# Patient Record
Sex: Female | Born: 1935 | Race: White | Hispanic: No | Smoking: Former smoker
Health system: Southern US, Community
[De-identification: ages and names within clinical notes are randomized; demographics above are authoritative.]

## PROBLEM LIST (undated history)

## (undated) DIAGNOSIS — I5023 Acute on chronic systolic (congestive) heart failure: Secondary | ICD-10-CM

## (undated) DIAGNOSIS — I255 Ischemic cardiomyopathy: Secondary | ICD-10-CM

## (undated) DIAGNOSIS — I2510 Atherosclerotic heart disease of native coronary artery without angina pectoris: Secondary | ICD-10-CM

## (undated) DIAGNOSIS — Z951 Presence of aortocoronary bypass graft: Secondary | ICD-10-CM

## (undated) DIAGNOSIS — F419 Anxiety disorder, unspecified: Secondary | ICD-10-CM

## (undated) DIAGNOSIS — Z95810 Presence of automatic (implantable) cardiac defibrillator: Secondary | ICD-10-CM

## (undated) DIAGNOSIS — J449 Chronic obstructive pulmonary disease, unspecified: Secondary | ICD-10-CM

## (undated) DIAGNOSIS — I10 Essential (primary) hypertension: Secondary | ICD-10-CM

## (undated) DIAGNOSIS — E785 Hyperlipidemia, unspecified: Secondary | ICD-10-CM

## (undated) DIAGNOSIS — J441 Chronic obstructive pulmonary disease with (acute) exacerbation: Secondary | ICD-10-CM

## (undated) DIAGNOSIS — I509 Heart failure, unspecified: Secondary | ICD-10-CM

## (undated) DIAGNOSIS — Z86711 Personal history of pulmonary embolism: Secondary | ICD-10-CM

## (undated) HISTORY — PX: CARDIAC CATHETERIZATION: SHX172

## (undated) HISTORY — DX: Presence of automatic (implantable) cardiac defibrillator: Z95.810

## (undated) HISTORY — DX: Ischemic cardiomyopathy: I25.5

## (undated) HISTORY — PX: CORONARY ARTERY BYPASS GRAFT: SHX141

## (undated) HISTORY — DX: Presence of aortocoronary bypass graft: Z95.1

## (undated) HISTORY — DX: Acute on chronic systolic (congestive) heart failure: I50.23

## (undated) HISTORY — DX: Chronic obstructive pulmonary disease with (acute) exacerbation: J44.1

## (undated) HISTORY — PX: TUBAL LIGATION: SHX77

---

## 1978-03-10 DIAGNOSIS — Z86711 Personal history of pulmonary embolism: Secondary | ICD-10-CM

## 1978-03-10 HISTORY — DX: Personal history of pulmonary embolism: Z86.711

## 2011-08-06 DIAGNOSIS — Z79899 Other long term (current) drug therapy: Secondary | ICD-10-CM | POA: Diagnosis not present

## 2011-08-06 DIAGNOSIS — J438 Other emphysema: Secondary | ICD-10-CM | POA: Diagnosis not present

## 2011-08-06 DIAGNOSIS — B372 Candidiasis of skin and nail: Secondary | ICD-10-CM | POA: Diagnosis not present

## 2011-08-06 DIAGNOSIS — I2510 Atherosclerotic heart disease of native coronary artery without angina pectoris: Secondary | ICD-10-CM | POA: Diagnosis not present

## 2011-08-06 DIAGNOSIS — E785 Hyperlipidemia, unspecified: Secondary | ICD-10-CM | POA: Diagnosis not present

## 2012-02-10 DIAGNOSIS — I2510 Atherosclerotic heart disease of native coronary artery without angina pectoris: Secondary | ICD-10-CM | POA: Diagnosis not present

## 2012-02-10 DIAGNOSIS — I10 Essential (primary) hypertension: Secondary | ICD-10-CM | POA: Diagnosis not present

## 2012-02-10 DIAGNOSIS — E785 Hyperlipidemia, unspecified: Secondary | ICD-10-CM | POA: Diagnosis not present

## 2012-02-10 DIAGNOSIS — J441 Chronic obstructive pulmonary disease with (acute) exacerbation: Secondary | ICD-10-CM | POA: Diagnosis not present

## 2012-02-10 DIAGNOSIS — F411 Generalized anxiety disorder: Secondary | ICD-10-CM | POA: Diagnosis not present

## 2012-08-10 DIAGNOSIS — I10 Essential (primary) hypertension: Secondary | ICD-10-CM | POA: Diagnosis not present

## 2012-08-10 DIAGNOSIS — I2510 Atherosclerotic heart disease of native coronary artery without angina pectoris: Secondary | ICD-10-CM | POA: Diagnosis not present

## 2012-08-10 DIAGNOSIS — Z1331 Encounter for screening for depression: Secondary | ICD-10-CM | POA: Diagnosis not present

## 2012-08-10 DIAGNOSIS — Z9181 History of falling: Secondary | ICD-10-CM | POA: Diagnosis not present

## 2012-11-13 DIAGNOSIS — J438 Other emphysema: Secondary | ICD-10-CM | POA: Diagnosis not present

## 2012-11-13 DIAGNOSIS — J189 Pneumonia, unspecified organism: Secondary | ICD-10-CM | POA: Diagnosis not present

## 2012-11-13 DIAGNOSIS — A779 Spotted fever, unspecified: Secondary | ICD-10-CM | POA: Diagnosis not present

## 2012-11-13 DIAGNOSIS — Z79899 Other long term (current) drug therapy: Secondary | ICD-10-CM | POA: Diagnosis not present

## 2013-03-05 DIAGNOSIS — J441 Chronic obstructive pulmonary disease with (acute) exacerbation: Secondary | ICD-10-CM | POA: Diagnosis not present

## 2013-03-05 DIAGNOSIS — R0789 Other chest pain: Secondary | ICD-10-CM | POA: Diagnosis not present

## 2013-03-05 DIAGNOSIS — R0989 Other specified symptoms and signs involving the circulatory and respiratory systems: Secondary | ICD-10-CM | POA: Diagnosis not present

## 2013-03-05 DIAGNOSIS — R42 Dizziness and giddiness: Secondary | ICD-10-CM | POA: Diagnosis not present

## 2013-03-05 DIAGNOSIS — Z87891 Personal history of nicotine dependence: Secondary | ICD-10-CM | POA: Diagnosis not present

## 2013-03-05 DIAGNOSIS — I252 Old myocardial infarction: Secondary | ICD-10-CM | POA: Diagnosis not present

## 2013-03-05 DIAGNOSIS — R5381 Other malaise: Secondary | ICD-10-CM | POA: Diagnosis not present

## 2013-03-05 DIAGNOSIS — Z79899 Other long term (current) drug therapy: Secondary | ICD-10-CM | POA: Diagnosis not present

## 2013-03-05 DIAGNOSIS — R0602 Shortness of breath: Secondary | ICD-10-CM | POA: Diagnosis not present

## 2013-03-05 DIAGNOSIS — Z7982 Long term (current) use of aspirin: Secondary | ICD-10-CM | POA: Diagnosis not present

## 2013-03-05 DIAGNOSIS — IMO0002 Reserved for concepts with insufficient information to code with codable children: Secondary | ICD-10-CM | POA: Diagnosis not present

## 2013-03-05 DIAGNOSIS — R069 Unspecified abnormalities of breathing: Secondary | ICD-10-CM | POA: Diagnosis not present

## 2013-03-05 DIAGNOSIS — R079 Chest pain, unspecified: Secondary | ICD-10-CM | POA: Diagnosis not present

## 2013-04-18 DIAGNOSIS — R5381 Other malaise: Secondary | ICD-10-CM | POA: Diagnosis not present

## 2013-04-18 DIAGNOSIS — J329 Chronic sinusitis, unspecified: Secondary | ICD-10-CM | POA: Diagnosis not present

## 2013-04-18 DIAGNOSIS — Z79899 Other long term (current) drug therapy: Secondary | ICD-10-CM | POA: Diagnosis not present

## 2013-04-18 DIAGNOSIS — R0602 Shortness of breath: Secondary | ICD-10-CM | POA: Diagnosis not present

## 2013-04-18 DIAGNOSIS — J440 Chronic obstructive pulmonary disease with acute lower respiratory infection: Secondary | ICD-10-CM | POA: Diagnosis not present

## 2013-04-18 DIAGNOSIS — I10 Essential (primary) hypertension: Secondary | ICD-10-CM | POA: Diagnosis not present

## 2013-04-18 DIAGNOSIS — E7800 Pure hypercholesterolemia, unspecified: Secondary | ICD-10-CM | POA: Diagnosis not present

## 2013-04-18 DIAGNOSIS — I509 Heart failure, unspecified: Secondary | ICD-10-CM | POA: Diagnosis not present

## 2013-04-18 DIAGNOSIS — J209 Acute bronchitis, unspecified: Secondary | ICD-10-CM | POA: Diagnosis not present

## 2013-08-10 DIAGNOSIS — I2510 Atherosclerotic heart disease of native coronary artery without angina pectoris: Secondary | ICD-10-CM | POA: Diagnosis not present

## 2013-08-10 DIAGNOSIS — B372 Candidiasis of skin and nail: Secondary | ICD-10-CM | POA: Diagnosis not present

## 2013-08-10 DIAGNOSIS — J438 Other emphysema: Secondary | ICD-10-CM | POA: Diagnosis not present

## 2013-08-10 DIAGNOSIS — J441 Chronic obstructive pulmonary disease with (acute) exacerbation: Secondary | ICD-10-CM | POA: Diagnosis not present

## 2013-08-10 DIAGNOSIS — I10 Essential (primary) hypertension: Secondary | ICD-10-CM | POA: Diagnosis not present

## 2013-11-07 DIAGNOSIS — H353290 Exudative age-related macular degeneration, unspecified eye, stage unspecified: Secondary | ICD-10-CM | POA: Diagnosis not present

## 2013-11-17 DIAGNOSIS — H353290 Exudative age-related macular degeneration, unspecified eye, stage unspecified: Secondary | ICD-10-CM | POA: Diagnosis not present

## 2013-11-17 DIAGNOSIS — H3560 Retinal hemorrhage, unspecified eye: Secondary | ICD-10-CM | POA: Diagnosis not present

## 2013-12-29 DIAGNOSIS — H3562 Retinal hemorrhage, left eye: Secondary | ICD-10-CM | POA: Diagnosis not present

## 2013-12-29 DIAGNOSIS — H3532 Exudative age-related macular degeneration: Secondary | ICD-10-CM | POA: Diagnosis not present

## 2014-02-09 DIAGNOSIS — R609 Edema, unspecified: Secondary | ICD-10-CM | POA: Diagnosis not present

## 2014-02-09 DIAGNOSIS — I10 Essential (primary) hypertension: Secondary | ICD-10-CM | POA: Diagnosis not present

## 2014-02-09 DIAGNOSIS — I2510 Atherosclerotic heart disease of native coronary artery without angina pectoris: Secondary | ICD-10-CM | POA: Diagnosis not present

## 2014-02-09 DIAGNOSIS — R634 Abnormal weight loss: Secondary | ICD-10-CM | POA: Diagnosis not present

## 2014-02-09 DIAGNOSIS — J019 Acute sinusitis, unspecified: Secondary | ICD-10-CM | POA: Diagnosis not present

## 2014-02-09 DIAGNOSIS — J309 Allergic rhinitis, unspecified: Secondary | ICD-10-CM | POA: Diagnosis not present

## 2014-02-09 DIAGNOSIS — F419 Anxiety disorder, unspecified: Secondary | ICD-10-CM | POA: Diagnosis not present

## 2014-02-16 DIAGNOSIS — H3532 Exudative age-related macular degeneration: Secondary | ICD-10-CM | POA: Diagnosis not present

## 2014-03-23 DIAGNOSIS — H3532 Exudative age-related macular degeneration: Secondary | ICD-10-CM | POA: Diagnosis not present

## 2014-05-25 DIAGNOSIS — H3532 Exudative age-related macular degeneration: Secondary | ICD-10-CM | POA: Diagnosis not present

## 2014-05-26 DIAGNOSIS — F419 Anxiety disorder, unspecified: Secondary | ICD-10-CM | POA: Diagnosis present

## 2014-05-26 DIAGNOSIS — I517 Cardiomegaly: Secondary | ICD-10-CM | POA: Diagnosis not present

## 2014-05-26 DIAGNOSIS — I38 Endocarditis, valve unspecified: Secondary | ICD-10-CM | POA: Diagnosis present

## 2014-05-26 DIAGNOSIS — I509 Heart failure, unspecified: Secondary | ICD-10-CM | POA: Diagnosis not present

## 2014-05-26 DIAGNOSIS — Z880 Allergy status to penicillin: Secondary | ICD-10-CM | POA: Diagnosis not present

## 2014-05-26 DIAGNOSIS — Z833 Family history of diabetes mellitus: Secondary | ICD-10-CM | POA: Diagnosis not present

## 2014-05-26 DIAGNOSIS — I429 Cardiomyopathy, unspecified: Secondary | ICD-10-CM | POA: Diagnosis present

## 2014-05-26 DIAGNOSIS — J441 Chronic obstructive pulmonary disease with (acute) exacerbation: Secondary | ICD-10-CM | POA: Diagnosis not present

## 2014-05-26 DIAGNOSIS — Z23 Encounter for immunization: Secondary | ICD-10-CM | POA: Diagnosis not present

## 2014-05-26 DIAGNOSIS — E872 Acidosis: Secondary | ICD-10-CM | POA: Diagnosis not present

## 2014-05-26 DIAGNOSIS — I252 Old myocardial infarction: Secondary | ICD-10-CM | POA: Diagnosis not present

## 2014-05-26 DIAGNOSIS — Z823 Family history of stroke: Secondary | ICD-10-CM | POA: Diagnosis not present

## 2014-05-26 DIAGNOSIS — J9601 Acute respiratory failure with hypoxia: Secondary | ICD-10-CM | POA: Diagnosis present

## 2014-05-26 DIAGNOSIS — Z8249 Family history of ischemic heart disease and other diseases of the circulatory system: Secondary | ICD-10-CM | POA: Diagnosis not present

## 2014-05-26 DIAGNOSIS — I240 Acute coronary thrombosis not resulting in myocardial infarction: Secondary | ICD-10-CM | POA: Diagnosis present

## 2014-05-26 DIAGNOSIS — R300 Dysuria: Secondary | ICD-10-CM | POA: Diagnosis present

## 2014-05-26 DIAGNOSIS — I213 ST elevation (STEMI) myocardial infarction of unspecified site: Secondary | ICD-10-CM | POA: Diagnosis not present

## 2014-05-26 DIAGNOSIS — E780 Pure hypercholesterolemia: Secondary | ICD-10-CM | POA: Diagnosis present

## 2014-05-26 DIAGNOSIS — J9690 Respiratory failure, unspecified, unspecified whether with hypoxia or hypercapnia: Secondary | ICD-10-CM | POA: Diagnosis not present

## 2014-05-26 DIAGNOSIS — I5023 Acute on chronic systolic (congestive) heart failure: Secondary | ICD-10-CM | POA: Diagnosis not present

## 2014-05-26 DIAGNOSIS — Z951 Presence of aortocoronary bypass graft: Secondary | ICD-10-CM | POA: Diagnosis not present

## 2014-05-26 DIAGNOSIS — I10 Essential (primary) hypertension: Secondary | ICD-10-CM | POA: Diagnosis present

## 2014-05-26 DIAGNOSIS — K219 Gastro-esophageal reflux disease without esophagitis: Secondary | ICD-10-CM | POA: Diagnosis present

## 2014-05-26 DIAGNOSIS — I340 Nonrheumatic mitral (valve) insufficiency: Secondary | ICD-10-CM | POA: Diagnosis not present

## 2014-05-26 DIAGNOSIS — K5900 Constipation, unspecified: Secondary | ICD-10-CM | POA: Diagnosis present

## 2014-05-26 DIAGNOSIS — R0602 Shortness of breath: Secondary | ICD-10-CM | POA: Diagnosis not present

## 2014-05-26 DIAGNOSIS — Z7982 Long term (current) use of aspirin: Secondary | ICD-10-CM | POA: Diagnosis not present

## 2014-05-26 DIAGNOSIS — Z79899 Other long term (current) drug therapy: Secondary | ICD-10-CM | POA: Diagnosis not present

## 2014-05-26 DIAGNOSIS — J159 Unspecified bacterial pneumonia: Secondary | ICD-10-CM | POA: Diagnosis not present

## 2014-05-26 DIAGNOSIS — J189 Pneumonia, unspecified organism: Secondary | ICD-10-CM | POA: Diagnosis not present

## 2014-05-26 DIAGNOSIS — J440 Chronic obstructive pulmonary disease with acute lower respiratory infection: Secondary | ICD-10-CM | POA: Diagnosis not present

## 2014-05-26 DIAGNOSIS — J9600 Acute respiratory failure, unspecified whether with hypoxia or hypercapnia: Secondary | ICD-10-CM | POA: Diagnosis not present

## 2014-05-26 DIAGNOSIS — Z836 Family history of other diseases of the respiratory system: Secondary | ICD-10-CM | POA: Diagnosis not present

## 2014-05-26 DIAGNOSIS — R918 Other nonspecific abnormal finding of lung field: Secondary | ICD-10-CM | POA: Diagnosis not present

## 2014-05-26 DIAGNOSIS — J984 Other disorders of lung: Secondary | ICD-10-CM | POA: Diagnosis not present

## 2014-05-26 DIAGNOSIS — M1990 Unspecified osteoarthritis, unspecified site: Secondary | ICD-10-CM | POA: Diagnosis present

## 2014-05-26 DIAGNOSIS — I428 Other cardiomyopathies: Secondary | ICD-10-CM | POA: Diagnosis not present

## 2014-05-29 DIAGNOSIS — I213 ST elevation (STEMI) myocardial infarction of unspecified site: Secondary | ICD-10-CM | POA: Diagnosis not present

## 2014-05-29 DIAGNOSIS — J159 Unspecified bacterial pneumonia: Secondary | ICD-10-CM | POA: Diagnosis not present

## 2014-05-29 DIAGNOSIS — J441 Chronic obstructive pulmonary disease with (acute) exacerbation: Secondary | ICD-10-CM | POA: Diagnosis not present

## 2014-05-29 DIAGNOSIS — I2510 Atherosclerotic heart disease of native coronary artery without angina pectoris: Secondary | ICD-10-CM | POA: Diagnosis not present

## 2014-05-29 DIAGNOSIS — I509 Heart failure, unspecified: Secondary | ICD-10-CM | POA: Diagnosis not present

## 2014-05-31 DIAGNOSIS — I2510 Atherosclerotic heart disease of native coronary artery without angina pectoris: Secondary | ICD-10-CM | POA: Diagnosis not present

## 2014-05-31 DIAGNOSIS — I213 ST elevation (STEMI) myocardial infarction of unspecified site: Secondary | ICD-10-CM | POA: Diagnosis not present

## 2014-05-31 DIAGNOSIS — J441 Chronic obstructive pulmonary disease with (acute) exacerbation: Secondary | ICD-10-CM | POA: Diagnosis not present

## 2014-05-31 DIAGNOSIS — J159 Unspecified bacterial pneumonia: Secondary | ICD-10-CM | POA: Diagnosis not present

## 2014-05-31 DIAGNOSIS — I509 Heart failure, unspecified: Secondary | ICD-10-CM | POA: Diagnosis not present

## 2014-06-01 DIAGNOSIS — I509 Heart failure, unspecified: Secondary | ICD-10-CM | POA: Diagnosis not present

## 2014-06-01 DIAGNOSIS — J441 Chronic obstructive pulmonary disease with (acute) exacerbation: Secondary | ICD-10-CM | POA: Diagnosis not present

## 2014-06-01 DIAGNOSIS — I213 ST elevation (STEMI) myocardial infarction of unspecified site: Secondary | ICD-10-CM | POA: Diagnosis not present

## 2014-06-01 DIAGNOSIS — J189 Pneumonia, unspecified organism: Secondary | ICD-10-CM | POA: Diagnosis not present

## 2014-06-01 DIAGNOSIS — Z79899 Other long term (current) drug therapy: Secondary | ICD-10-CM | POA: Diagnosis not present

## 2014-06-01 DIAGNOSIS — I2510 Atherosclerotic heart disease of native coronary artery without angina pectoris: Secondary | ICD-10-CM | POA: Diagnosis not present

## 2014-06-05 DIAGNOSIS — I213 ST elevation (STEMI) myocardial infarction of unspecified site: Secondary | ICD-10-CM | POA: Diagnosis not present

## 2014-06-05 DIAGNOSIS — J441 Chronic obstructive pulmonary disease with (acute) exacerbation: Secondary | ICD-10-CM | POA: Diagnosis not present

## 2014-06-05 DIAGNOSIS — J159 Unspecified bacterial pneumonia: Secondary | ICD-10-CM | POA: Diagnosis not present

## 2014-06-05 DIAGNOSIS — I2510 Atherosclerotic heart disease of native coronary artery without angina pectoris: Secondary | ICD-10-CM | POA: Diagnosis not present

## 2014-06-05 DIAGNOSIS — I509 Heart failure, unspecified: Secondary | ICD-10-CM | POA: Diagnosis not present

## 2014-06-07 DIAGNOSIS — I509 Heart failure, unspecified: Secondary | ICD-10-CM | POA: Diagnosis not present

## 2014-06-07 DIAGNOSIS — I2510 Atherosclerotic heart disease of native coronary artery without angina pectoris: Secondary | ICD-10-CM | POA: Diagnosis not present

## 2014-06-07 DIAGNOSIS — I213 ST elevation (STEMI) myocardial infarction of unspecified site: Secondary | ICD-10-CM | POA: Diagnosis not present

## 2014-06-07 DIAGNOSIS — J441 Chronic obstructive pulmonary disease with (acute) exacerbation: Secondary | ICD-10-CM | POA: Diagnosis not present

## 2014-06-07 DIAGNOSIS — J159 Unspecified bacterial pneumonia: Secondary | ICD-10-CM | POA: Diagnosis not present

## 2014-06-08 DIAGNOSIS — I428 Other cardiomyopathies: Secondary | ICD-10-CM | POA: Diagnosis not present

## 2014-06-08 DIAGNOSIS — I2510 Atherosclerotic heart disease of native coronary artery without angina pectoris: Secondary | ICD-10-CM | POA: Diagnosis not present

## 2014-06-08 DIAGNOSIS — I10 Essential (primary) hypertension: Secondary | ICD-10-CM | POA: Diagnosis not present

## 2014-06-09 DIAGNOSIS — J159 Unspecified bacterial pneumonia: Secondary | ICD-10-CM | POA: Diagnosis not present

## 2014-06-09 DIAGNOSIS — J441 Chronic obstructive pulmonary disease with (acute) exacerbation: Secondary | ICD-10-CM | POA: Diagnosis not present

## 2014-06-09 DIAGNOSIS — I2510 Atherosclerotic heart disease of native coronary artery without angina pectoris: Secondary | ICD-10-CM | POA: Diagnosis not present

## 2014-06-09 DIAGNOSIS — I509 Heart failure, unspecified: Secondary | ICD-10-CM | POA: Diagnosis not present

## 2014-06-09 DIAGNOSIS — I213 ST elevation (STEMI) myocardial infarction of unspecified site: Secondary | ICD-10-CM | POA: Diagnosis not present

## 2014-06-13 DIAGNOSIS — M1990 Unspecified osteoarthritis, unspecified site: Secondary | ICD-10-CM | POA: Diagnosis present

## 2014-06-13 DIAGNOSIS — I441 Atrioventricular block, second degree: Secondary | ICD-10-CM | POA: Diagnosis not present

## 2014-06-13 DIAGNOSIS — Z87448 Personal history of other diseases of urinary system: Secondary | ICD-10-CM | POA: Diagnosis not present

## 2014-06-13 DIAGNOSIS — I509 Heart failure, unspecified: Secondary | ICD-10-CM | POA: Diagnosis not present

## 2014-06-13 DIAGNOSIS — N179 Acute kidney failure, unspecified: Secondary | ICD-10-CM | POA: Diagnosis not present

## 2014-06-13 DIAGNOSIS — I10 Essential (primary) hypertension: Secondary | ICD-10-CM | POA: Diagnosis present

## 2014-06-13 DIAGNOSIS — T460X1A Poisoning by cardiac-stimulant glycosides and drugs of similar action, accidental (unintentional), initial encounter: Secondary | ICD-10-CM | POA: Diagnosis not present

## 2014-06-13 DIAGNOSIS — Z951 Presence of aortocoronary bypass graft: Secondary | ICD-10-CM | POA: Diagnosis not present

## 2014-06-13 DIAGNOSIS — I429 Cardiomyopathy, unspecified: Secondary | ICD-10-CM | POA: Diagnosis present

## 2014-06-13 DIAGNOSIS — I5022 Chronic systolic (congestive) heart failure: Secondary | ICD-10-CM | POA: Diagnosis present

## 2014-06-13 DIAGNOSIS — R4182 Altered mental status, unspecified: Secondary | ICD-10-CM | POA: Diagnosis not present

## 2014-06-13 DIAGNOSIS — I38 Endocarditis, valve unspecified: Secondary | ICD-10-CM | POA: Diagnosis present

## 2014-06-13 DIAGNOSIS — R001 Bradycardia, unspecified: Secondary | ICD-10-CM | POA: Diagnosis not present

## 2014-06-13 DIAGNOSIS — J439 Emphysema, unspecified: Secondary | ICD-10-CM | POA: Diagnosis present

## 2014-06-13 DIAGNOSIS — J80 Acute respiratory distress syndrome: Secondary | ICD-10-CM | POA: Diagnosis not present

## 2014-06-13 DIAGNOSIS — G92 Toxic encephalopathy: Secondary | ICD-10-CM | POA: Diagnosis not present

## 2014-06-13 DIAGNOSIS — R4781 Slurred speech: Secondary | ICD-10-CM | POA: Diagnosis not present

## 2014-06-13 DIAGNOSIS — Z8701 Personal history of pneumonia (recurrent): Secondary | ICD-10-CM | POA: Diagnosis not present

## 2014-06-13 DIAGNOSIS — Z9889 Other specified postprocedural states: Secondary | ICD-10-CM | POA: Diagnosis not present

## 2014-06-13 DIAGNOSIS — I213 ST elevation (STEMI) myocardial infarction of unspecified site: Secondary | ICD-10-CM | POA: Diagnosis present

## 2014-06-13 DIAGNOSIS — K219 Gastro-esophageal reflux disease without esophagitis: Secondary | ICD-10-CM | POA: Diagnosis present

## 2014-06-13 DIAGNOSIS — E780 Pure hypercholesterolemia: Secondary | ICD-10-CM | POA: Diagnosis present

## 2014-06-13 DIAGNOSIS — R413 Other amnesia: Secondary | ICD-10-CM | POA: Diagnosis not present

## 2014-06-13 DIAGNOSIS — J9611 Chronic respiratory failure with hypoxia: Secondary | ICD-10-CM | POA: Diagnosis not present

## 2014-06-13 DIAGNOSIS — Z86711 Personal history of pulmonary embolism: Secondary | ICD-10-CM | POA: Diagnosis not present

## 2014-06-13 DIAGNOSIS — I2510 Atherosclerotic heart disease of native coronary artery without angina pectoris: Secondary | ICD-10-CM | POA: Diagnosis not present

## 2014-06-13 DIAGNOSIS — R410 Disorientation, unspecified: Secondary | ICD-10-CM | POA: Diagnosis not present

## 2014-06-13 DIAGNOSIS — E875 Hyperkalemia: Secondary | ICD-10-CM | POA: Diagnosis not present

## 2014-06-13 DIAGNOSIS — D649 Anemia, unspecified: Secondary | ICD-10-CM | POA: Diagnosis present

## 2014-06-13 DIAGNOSIS — N170 Acute kidney failure with tubular necrosis: Secondary | ICD-10-CM | POA: Diagnosis not present

## 2014-06-13 DIAGNOSIS — Z7901 Long term (current) use of anticoagulants: Secondary | ICD-10-CM | POA: Diagnosis not present

## 2014-06-13 DIAGNOSIS — I442 Atrioventricular block, complete: Secondary | ICD-10-CM | POA: Diagnosis present

## 2014-06-13 DIAGNOSIS — F419 Anxiety disorder, unspecified: Secondary | ICD-10-CM | POA: Diagnosis present

## 2014-06-13 DIAGNOSIS — J449 Chronic obstructive pulmonary disease, unspecified: Secondary | ICD-10-CM | POA: Diagnosis not present

## 2014-06-13 DIAGNOSIS — I252 Old myocardial infarction: Secondary | ICD-10-CM | POA: Diagnosis not present

## 2014-06-13 DIAGNOSIS — I6789 Other cerebrovascular disease: Secondary | ICD-10-CM | POA: Diagnosis not present

## 2014-06-22 DIAGNOSIS — N179 Acute kidney failure, unspecified: Secondary | ICD-10-CM | POA: Diagnosis not present

## 2014-06-22 DIAGNOSIS — Z1389 Encounter for screening for other disorder: Secondary | ICD-10-CM | POA: Diagnosis not present

## 2014-06-22 DIAGNOSIS — I459 Conduction disorder, unspecified: Secondary | ICD-10-CM | POA: Diagnosis not present

## 2014-06-22 DIAGNOSIS — J441 Chronic obstructive pulmonary disease with (acute) exacerbation: Secondary | ICD-10-CM | POA: Diagnosis not present

## 2014-06-22 DIAGNOSIS — I509 Heart failure, unspecified: Secondary | ICD-10-CM | POA: Diagnosis not present

## 2014-06-22 DIAGNOSIS — I213 ST elevation (STEMI) myocardial infarction of unspecified site: Secondary | ICD-10-CM | POA: Diagnosis not present

## 2014-06-22 DIAGNOSIS — E875 Hyperkalemia: Secondary | ICD-10-CM | POA: Diagnosis not present

## 2014-06-22 DIAGNOSIS — Z9181 History of falling: Secondary | ICD-10-CM | POA: Diagnosis not present

## 2014-06-22 DIAGNOSIS — T460X1A Poisoning by cardiac-stimulant glycosides and drugs of similar action, accidental (unintentional), initial encounter: Secondary | ICD-10-CM | POA: Diagnosis not present

## 2014-06-23 DIAGNOSIS — J441 Chronic obstructive pulmonary disease with (acute) exacerbation: Secondary | ICD-10-CM | POA: Diagnosis not present

## 2014-06-23 DIAGNOSIS — I509 Heart failure, unspecified: Secondary | ICD-10-CM | POA: Diagnosis not present

## 2014-06-23 DIAGNOSIS — I2510 Atherosclerotic heart disease of native coronary artery without angina pectoris: Secondary | ICD-10-CM | POA: Diagnosis not present

## 2014-06-23 DIAGNOSIS — I213 ST elevation (STEMI) myocardial infarction of unspecified site: Secondary | ICD-10-CM | POA: Diagnosis not present

## 2014-06-23 DIAGNOSIS — J159 Unspecified bacterial pneumonia: Secondary | ICD-10-CM | POA: Diagnosis not present

## 2014-06-27 DIAGNOSIS — I2510 Atherosclerotic heart disease of native coronary artery without angina pectoris: Secondary | ICD-10-CM | POA: Diagnosis not present

## 2014-06-27 DIAGNOSIS — I213 ST elevation (STEMI) myocardial infarction of unspecified site: Secondary | ICD-10-CM | POA: Diagnosis not present

## 2014-06-27 DIAGNOSIS — J441 Chronic obstructive pulmonary disease with (acute) exacerbation: Secondary | ICD-10-CM | POA: Diagnosis not present

## 2014-06-27 DIAGNOSIS — J159 Unspecified bacterial pneumonia: Secondary | ICD-10-CM | POA: Diagnosis not present

## 2014-06-27 DIAGNOSIS — I509 Heart failure, unspecified: Secondary | ICD-10-CM | POA: Diagnosis not present

## 2014-06-30 DIAGNOSIS — I2510 Atherosclerotic heart disease of native coronary artery without angina pectoris: Secondary | ICD-10-CM | POA: Diagnosis not present

## 2014-06-30 DIAGNOSIS — J159 Unspecified bacterial pneumonia: Secondary | ICD-10-CM | POA: Diagnosis not present

## 2014-06-30 DIAGNOSIS — J441 Chronic obstructive pulmonary disease with (acute) exacerbation: Secondary | ICD-10-CM | POA: Diagnosis not present

## 2014-06-30 DIAGNOSIS — I213 ST elevation (STEMI) myocardial infarction of unspecified site: Secondary | ICD-10-CM | POA: Diagnosis not present

## 2014-06-30 DIAGNOSIS — I509 Heart failure, unspecified: Secondary | ICD-10-CM | POA: Diagnosis not present

## 2014-07-05 DIAGNOSIS — J441 Chronic obstructive pulmonary disease with (acute) exacerbation: Secondary | ICD-10-CM | POA: Diagnosis not present

## 2014-07-05 DIAGNOSIS — I2510 Atherosclerotic heart disease of native coronary artery without angina pectoris: Secondary | ICD-10-CM | POA: Diagnosis not present

## 2014-07-05 DIAGNOSIS — I213 ST elevation (STEMI) myocardial infarction of unspecified site: Secondary | ICD-10-CM | POA: Diagnosis not present

## 2014-07-05 DIAGNOSIS — J159 Unspecified bacterial pneumonia: Secondary | ICD-10-CM | POA: Diagnosis not present

## 2014-07-05 DIAGNOSIS — I509 Heart failure, unspecified: Secondary | ICD-10-CM | POA: Diagnosis not present

## 2014-07-07 DIAGNOSIS — J441 Chronic obstructive pulmonary disease with (acute) exacerbation: Secondary | ICD-10-CM | POA: Diagnosis not present

## 2014-07-07 DIAGNOSIS — I213 ST elevation (STEMI) myocardial infarction of unspecified site: Secondary | ICD-10-CM | POA: Diagnosis not present

## 2014-07-07 DIAGNOSIS — I2510 Atherosclerotic heart disease of native coronary artery without angina pectoris: Secondary | ICD-10-CM | POA: Diagnosis not present

## 2014-07-07 DIAGNOSIS — I509 Heart failure, unspecified: Secondary | ICD-10-CM | POA: Diagnosis not present

## 2014-07-07 DIAGNOSIS — J159 Unspecified bacterial pneumonia: Secondary | ICD-10-CM | POA: Diagnosis not present

## 2014-07-10 DIAGNOSIS — J159 Unspecified bacterial pneumonia: Secondary | ICD-10-CM | POA: Diagnosis not present

## 2014-07-10 DIAGNOSIS — I213 ST elevation (STEMI) myocardial infarction of unspecified site: Secondary | ICD-10-CM | POA: Diagnosis not present

## 2014-07-10 DIAGNOSIS — I509 Heart failure, unspecified: Secondary | ICD-10-CM | POA: Diagnosis not present

## 2014-07-10 DIAGNOSIS — J441 Chronic obstructive pulmonary disease with (acute) exacerbation: Secondary | ICD-10-CM | POA: Diagnosis not present

## 2014-07-10 DIAGNOSIS — I2510 Atherosclerotic heart disease of native coronary artery without angina pectoris: Secondary | ICD-10-CM | POA: Diagnosis not present

## 2014-07-13 DIAGNOSIS — I2510 Atherosclerotic heart disease of native coronary artery without angina pectoris: Secondary | ICD-10-CM | POA: Diagnosis not present

## 2014-07-13 DIAGNOSIS — I509 Heart failure, unspecified: Secondary | ICD-10-CM | POA: Diagnosis not present

## 2014-07-13 DIAGNOSIS — J441 Chronic obstructive pulmonary disease with (acute) exacerbation: Secondary | ICD-10-CM | POA: Diagnosis not present

## 2014-07-13 DIAGNOSIS — I213 ST elevation (STEMI) myocardial infarction of unspecified site: Secondary | ICD-10-CM | POA: Diagnosis not present

## 2014-07-13 DIAGNOSIS — J159 Unspecified bacterial pneumonia: Secondary | ICD-10-CM | POA: Diagnosis not present

## 2014-07-13 DIAGNOSIS — H3532 Exudative age-related macular degeneration: Secondary | ICD-10-CM | POA: Diagnosis not present

## 2014-07-20 DIAGNOSIS — I2510 Atherosclerotic heart disease of native coronary artery without angina pectoris: Secondary | ICD-10-CM | POA: Diagnosis not present

## 2014-07-20 DIAGNOSIS — J159 Unspecified bacterial pneumonia: Secondary | ICD-10-CM | POA: Diagnosis not present

## 2014-07-20 DIAGNOSIS — I509 Heart failure, unspecified: Secondary | ICD-10-CM | POA: Diagnosis not present

## 2014-07-20 DIAGNOSIS — J441 Chronic obstructive pulmonary disease with (acute) exacerbation: Secondary | ICD-10-CM | POA: Diagnosis not present

## 2014-07-20 DIAGNOSIS — I213 ST elevation (STEMI) myocardial infarction of unspecified site: Secondary | ICD-10-CM | POA: Diagnosis not present

## 2014-07-24 DIAGNOSIS — I428 Other cardiomyopathies: Secondary | ICD-10-CM | POA: Diagnosis not present

## 2014-07-24 DIAGNOSIS — I2510 Atherosclerotic heart disease of native coronary artery without angina pectoris: Secondary | ICD-10-CM | POA: Diagnosis not present

## 2014-07-24 DIAGNOSIS — I509 Heart failure, unspecified: Secondary | ICD-10-CM | POA: Diagnosis not present

## 2014-08-11 DIAGNOSIS — Z6823 Body mass index (BMI) 23.0-23.9, adult: Secondary | ICD-10-CM | POA: Diagnosis not present

## 2014-08-11 DIAGNOSIS — I2510 Atherosclerotic heart disease of native coronary artery without angina pectoris: Secondary | ICD-10-CM | POA: Diagnosis not present

## 2014-08-11 DIAGNOSIS — I429 Cardiomyopathy, unspecified: Secondary | ICD-10-CM | POA: Diagnosis not present

## 2014-08-11 DIAGNOSIS — J439 Emphysema, unspecified: Secondary | ICD-10-CM | POA: Diagnosis not present

## 2014-08-11 DIAGNOSIS — I509 Heart failure, unspecified: Secondary | ICD-10-CM | POA: Diagnosis not present

## 2014-08-11 DIAGNOSIS — Z79899 Other long term (current) drug therapy: Secondary | ICD-10-CM | POA: Diagnosis not present

## 2014-08-11 DIAGNOSIS — Z1389 Encounter for screening for other disorder: Secondary | ICD-10-CM | POA: Diagnosis not present

## 2014-08-11 DIAGNOSIS — I10 Essential (primary) hypertension: Secondary | ICD-10-CM | POA: Diagnosis not present

## 2014-08-11 DIAGNOSIS — F419 Anxiety disorder, unspecified: Secondary | ICD-10-CM | POA: Diagnosis not present

## 2014-09-14 DIAGNOSIS — H3532 Exudative age-related macular degeneration: Secondary | ICD-10-CM | POA: Diagnosis not present

## 2014-10-04 DIAGNOSIS — H25813 Combined forms of age-related cataract, bilateral: Secondary | ICD-10-CM | POA: Diagnosis not present

## 2014-12-08 DIAGNOSIS — H25811 Combined forms of age-related cataract, right eye: Secondary | ICD-10-CM | POA: Diagnosis not present

## 2014-12-08 DIAGNOSIS — H3531 Nonexudative age-related macular degeneration: Secondary | ICD-10-CM | POA: Diagnosis not present

## 2015-01-02 DIAGNOSIS — I110 Hypertensive heart disease with heart failure: Secondary | ICD-10-CM | POA: Diagnosis not present

## 2015-01-02 DIAGNOSIS — I2510 Atherosclerotic heart disease of native coronary artery without angina pectoris: Secondary | ICD-10-CM | POA: Diagnosis not present

## 2015-01-02 DIAGNOSIS — E785 Hyperlipidemia, unspecified: Secondary | ICD-10-CM | POA: Diagnosis not present

## 2015-01-02 DIAGNOSIS — K219 Gastro-esophageal reflux disease without esophagitis: Secondary | ICD-10-CM | POA: Diagnosis not present

## 2015-01-02 DIAGNOSIS — Z87891 Personal history of nicotine dependence: Secondary | ICD-10-CM | POA: Diagnosis not present

## 2015-01-02 DIAGNOSIS — J439 Emphysema, unspecified: Secondary | ICD-10-CM | POA: Diagnosis not present

## 2015-01-02 DIAGNOSIS — Z951 Presence of aortocoronary bypass graft: Secondary | ICD-10-CM | POA: Diagnosis not present

## 2015-01-02 DIAGNOSIS — E119 Type 2 diabetes mellitus without complications: Secondary | ICD-10-CM | POA: Diagnosis not present

## 2015-01-02 DIAGNOSIS — H25811 Combined forms of age-related cataract, right eye: Secondary | ICD-10-CM | POA: Diagnosis not present

## 2015-01-02 DIAGNOSIS — I252 Old myocardial infarction: Secondary | ICD-10-CM | POA: Diagnosis not present

## 2015-01-02 DIAGNOSIS — I509 Heart failure, unspecified: Secondary | ICD-10-CM | POA: Diagnosis not present

## 2015-02-09 DIAGNOSIS — I429 Cardiomyopathy, unspecified: Secondary | ICD-10-CM | POA: Diagnosis not present

## 2015-02-09 DIAGNOSIS — J019 Acute sinusitis, unspecified: Secondary | ICD-10-CM | POA: Diagnosis not present

## 2015-02-09 DIAGNOSIS — Z1389 Encounter for screening for other disorder: Secondary | ICD-10-CM | POA: Diagnosis not present

## 2015-02-09 DIAGNOSIS — E785 Hyperlipidemia, unspecified: Secondary | ICD-10-CM | POA: Diagnosis not present

## 2015-02-09 DIAGNOSIS — F419 Anxiety disorder, unspecified: Secondary | ICD-10-CM | POA: Diagnosis not present

## 2015-02-09 DIAGNOSIS — I2510 Atherosclerotic heart disease of native coronary artery without angina pectoris: Secondary | ICD-10-CM | POA: Diagnosis not present

## 2015-02-09 DIAGNOSIS — Z79899 Other long term (current) drug therapy: Secondary | ICD-10-CM | POA: Diagnosis not present

## 2015-02-09 DIAGNOSIS — J439 Emphysema, unspecified: Secondary | ICD-10-CM | POA: Diagnosis not present

## 2015-02-09 DIAGNOSIS — I509 Heart failure, unspecified: Secondary | ICD-10-CM | POA: Diagnosis not present

## 2015-03-20 DIAGNOSIS — I509 Heart failure, unspecified: Secondary | ICD-10-CM | POA: Diagnosis not present

## 2015-03-20 DIAGNOSIS — I2510 Atherosclerotic heart disease of native coronary artery without angina pectoris: Secondary | ICD-10-CM | POA: Diagnosis not present

## 2015-03-20 DIAGNOSIS — K219 Gastro-esophageal reflux disease without esophagitis: Secondary | ICD-10-CM | POA: Diagnosis not present

## 2015-03-20 DIAGNOSIS — H25812 Combined forms of age-related cataract, left eye: Secondary | ICD-10-CM | POA: Diagnosis not present

## 2015-03-20 DIAGNOSIS — I252 Old myocardial infarction: Secondary | ICD-10-CM | POA: Diagnosis not present

## 2015-03-20 DIAGNOSIS — Z951 Presence of aortocoronary bypass graft: Secondary | ICD-10-CM | POA: Diagnosis not present

## 2015-03-20 DIAGNOSIS — Z79899 Other long term (current) drug therapy: Secondary | ICD-10-CM | POA: Diagnosis not present

## 2015-03-20 DIAGNOSIS — E785 Hyperlipidemia, unspecified: Secondary | ICD-10-CM | POA: Diagnosis not present

## 2015-03-20 DIAGNOSIS — J439 Emphysema, unspecified: Secondary | ICD-10-CM | POA: Diagnosis not present

## 2015-03-20 DIAGNOSIS — N189 Chronic kidney disease, unspecified: Secondary | ICD-10-CM | POA: Diagnosis not present

## 2015-03-20 DIAGNOSIS — E119 Type 2 diabetes mellitus without complications: Secondary | ICD-10-CM | POA: Diagnosis not present

## 2015-03-20 DIAGNOSIS — I129 Hypertensive chronic kidney disease with stage 1 through stage 4 chronic kidney disease, or unspecified chronic kidney disease: Secondary | ICD-10-CM | POA: Diagnosis not present

## 2015-03-20 DIAGNOSIS — Z87891 Personal history of nicotine dependence: Secondary | ICD-10-CM | POA: Diagnosis not present

## 2015-04-22 DIAGNOSIS — M546 Pain in thoracic spine: Secondary | ICD-10-CM | POA: Diagnosis not present

## 2015-04-22 DIAGNOSIS — T380X5A Adverse effect of glucocorticoids and synthetic analogues, initial encounter: Secondary | ICD-10-CM | POA: Diagnosis not present

## 2015-04-22 DIAGNOSIS — Z87891 Personal history of nicotine dependence: Secondary | ICD-10-CM | POA: Diagnosis not present

## 2015-04-22 DIAGNOSIS — Z951 Presence of aortocoronary bypass graft: Secondary | ICD-10-CM | POA: Diagnosis not present

## 2015-04-22 DIAGNOSIS — A419 Sepsis, unspecified organism: Secondary | ICD-10-CM | POA: Diagnosis not present

## 2015-04-22 DIAGNOSIS — I509 Heart failure, unspecified: Secondary | ICD-10-CM | POA: Diagnosis not present

## 2015-04-22 DIAGNOSIS — I255 Ischemic cardiomyopathy: Secondary | ICD-10-CM | POA: Diagnosis present

## 2015-04-22 DIAGNOSIS — E876 Hypokalemia: Secondary | ICD-10-CM | POA: Diagnosis not present

## 2015-04-22 DIAGNOSIS — I10 Essential (primary) hypertension: Secondary | ICD-10-CM | POA: Diagnosis not present

## 2015-04-22 DIAGNOSIS — R918 Other nonspecific abnormal finding of lung field: Secondary | ICD-10-CM | POA: Diagnosis not present

## 2015-04-22 DIAGNOSIS — R739 Hyperglycemia, unspecified: Secondary | ICD-10-CM | POA: Diagnosis not present

## 2015-04-22 DIAGNOSIS — R0789 Other chest pain: Secondary | ICD-10-CM | POA: Diagnosis not present

## 2015-04-22 DIAGNOSIS — J441 Chronic obstructive pulmonary disease with (acute) exacerbation: Secondary | ICD-10-CM

## 2015-04-22 DIAGNOSIS — I501 Left ventricular failure: Secondary | ICD-10-CM | POA: Diagnosis not present

## 2015-04-22 DIAGNOSIS — J440 Chronic obstructive pulmonary disease with acute lower respiratory infection: Secondary | ICD-10-CM | POA: Diagnosis not present

## 2015-04-22 DIAGNOSIS — R269 Unspecified abnormalities of gait and mobility: Secondary | ICD-10-CM | POA: Diagnosis present

## 2015-04-22 DIAGNOSIS — Z7982 Long term (current) use of aspirin: Secondary | ICD-10-CM | POA: Diagnosis not present

## 2015-04-22 DIAGNOSIS — Z79899 Other long term (current) drug therapy: Secondary | ICD-10-CM | POA: Diagnosis not present

## 2015-04-22 DIAGNOSIS — Z955 Presence of coronary angioplasty implant and graft: Secondary | ICD-10-CM | POA: Diagnosis not present

## 2015-04-22 DIAGNOSIS — I472 Ventricular tachycardia: Secondary | ICD-10-CM | POA: Diagnosis not present

## 2015-04-22 DIAGNOSIS — E785 Hyperlipidemia, unspecified: Secondary | ICD-10-CM | POA: Diagnosis not present

## 2015-04-22 DIAGNOSIS — Z006 Encounter for examination for normal comparison and control in clinical research program: Secondary | ICD-10-CM | POA: Diagnosis not present

## 2015-04-22 DIAGNOSIS — I2510 Atherosclerotic heart disease of native coronary artery without angina pectoris: Secondary | ICD-10-CM | POA: Diagnosis not present

## 2015-04-22 DIAGNOSIS — R079 Chest pain, unspecified: Secondary | ICD-10-CM | POA: Diagnosis not present

## 2015-04-22 DIAGNOSIS — I5023 Acute on chronic systolic (congestive) heart failure: Secondary | ICD-10-CM | POA: Diagnosis present

## 2015-04-22 DIAGNOSIS — I110 Hypertensive heart disease with heart failure: Secondary | ICD-10-CM | POA: Diagnosis not present

## 2015-04-22 DIAGNOSIS — J449 Chronic obstructive pulmonary disease, unspecified: Secondary | ICD-10-CM | POA: Diagnosis not present

## 2015-04-22 DIAGNOSIS — I081 Rheumatic disorders of both mitral and tricuspid valves: Secondary | ICD-10-CM | POA: Diagnosis not present

## 2015-04-22 DIAGNOSIS — I252 Old myocardial infarction: Secondary | ICD-10-CM | POA: Diagnosis not present

## 2015-04-22 DIAGNOSIS — Z86711 Personal history of pulmonary embolism: Secondary | ICD-10-CM | POA: Diagnosis not present

## 2015-04-22 DIAGNOSIS — Z880 Allergy status to penicillin: Secondary | ICD-10-CM | POA: Diagnosis not present

## 2015-04-22 DIAGNOSIS — J189 Pneumonia, unspecified organism: Secondary | ICD-10-CM | POA: Diagnosis not present

## 2015-04-22 HISTORY — DX: Chronic obstructive pulmonary disease with (acute) exacerbation: J44.1

## 2015-04-24 DIAGNOSIS — I5023 Acute on chronic systolic (congestive) heart failure: Secondary | ICD-10-CM

## 2015-04-24 HISTORY — DX: Acute on chronic systolic (congestive) heart failure: I50.23

## 2015-04-25 DIAGNOSIS — Z95810 Presence of automatic (implantable) cardiac defibrillator: Secondary | ICD-10-CM | POA: Insufficient documentation

## 2015-04-25 HISTORY — DX: Presence of automatic (implantable) cardiac defibrillator: Z95.810

## 2015-05-01 DIAGNOSIS — Z1389 Encounter for screening for other disorder: Secondary | ICD-10-CM | POA: Diagnosis not present

## 2015-05-01 DIAGNOSIS — J181 Lobar pneumonia, unspecified organism: Secondary | ICD-10-CM | POA: Diagnosis not present

## 2015-05-01 DIAGNOSIS — I509 Heart failure, unspecified: Secondary | ICD-10-CM | POA: Diagnosis not present

## 2015-05-01 DIAGNOSIS — Z95810 Presence of automatic (implantable) cardiac defibrillator: Secondary | ICD-10-CM | POA: Diagnosis not present

## 2015-05-01 DIAGNOSIS — F419 Anxiety disorder, unspecified: Secondary | ICD-10-CM | POA: Diagnosis not present

## 2015-05-07 DIAGNOSIS — Z4502 Encounter for adjustment and management of automatic implantable cardiac defibrillator: Secondary | ICD-10-CM | POA: Diagnosis not present

## 2015-05-07 DIAGNOSIS — I5023 Acute on chronic systolic (congestive) heart failure: Secondary | ICD-10-CM | POA: Diagnosis not present

## 2015-05-10 DIAGNOSIS — R000 Tachycardia, unspecified: Secondary | ICD-10-CM | POA: Diagnosis not present

## 2015-05-10 DIAGNOSIS — J810 Acute pulmonary edema: Secondary | ICD-10-CM | POA: Diagnosis not present

## 2015-05-10 DIAGNOSIS — Z4682 Encounter for fitting and adjustment of non-vascular catheter: Secondary | ICD-10-CM | POA: Diagnosis not present

## 2015-05-10 DIAGNOSIS — I959 Hypotension, unspecified: Secondary | ICD-10-CM | POA: Diagnosis not present

## 2015-05-10 DIAGNOSIS — M1990 Unspecified osteoarthritis, unspecified site: Secondary | ICD-10-CM | POA: Diagnosis present

## 2015-05-10 DIAGNOSIS — E1165 Type 2 diabetes mellitus with hyperglycemia: Secondary | ICD-10-CM | POA: Diagnosis not present

## 2015-05-10 DIAGNOSIS — E7800 Pure hypercholesterolemia, unspecified: Secondary | ICD-10-CM | POA: Diagnosis present

## 2015-05-10 DIAGNOSIS — J156 Pneumonia due to other aerobic Gram-negative bacteria: Secondary | ICD-10-CM | POA: Diagnosis not present

## 2015-05-10 DIAGNOSIS — Z86711 Personal history of pulmonary embolism: Secondary | ICD-10-CM | POA: Diagnosis not present

## 2015-05-10 DIAGNOSIS — J9610 Chronic respiratory failure, unspecified whether with hypoxia or hypercapnia: Secondary | ICD-10-CM | POA: Diagnosis not present

## 2015-05-10 DIAGNOSIS — R0602 Shortness of breath: Secondary | ICD-10-CM | POA: Diagnosis not present

## 2015-05-10 DIAGNOSIS — I517 Cardiomegaly: Secondary | ICD-10-CM | POA: Diagnosis not present

## 2015-05-10 DIAGNOSIS — D508 Other iron deficiency anemias: Secondary | ICD-10-CM | POA: Diagnosis not present

## 2015-05-10 DIAGNOSIS — I509 Heart failure, unspecified: Secondary | ICD-10-CM | POA: Diagnosis not present

## 2015-05-10 DIAGNOSIS — J9620 Acute and chronic respiratory failure, unspecified whether with hypoxia or hypercapnia: Secondary | ICD-10-CM | POA: Diagnosis not present

## 2015-05-10 DIAGNOSIS — R579 Shock, unspecified: Secondary | ICD-10-CM | POA: Diagnosis not present

## 2015-05-10 DIAGNOSIS — I10 Essential (primary) hypertension: Secondary | ICD-10-CM | POA: Diagnosis present

## 2015-05-10 DIAGNOSIS — D649 Anemia, unspecified: Secondary | ICD-10-CM | POA: Diagnosis not present

## 2015-05-10 DIAGNOSIS — Z880 Allergy status to penicillin: Secondary | ICD-10-CM | POA: Diagnosis not present

## 2015-05-10 DIAGNOSIS — Z888 Allergy status to other drugs, medicaments and biological substances status: Secondary | ICD-10-CM | POA: Diagnosis not present

## 2015-05-10 DIAGNOSIS — I519 Heart disease, unspecified: Secondary | ICD-10-CM | POA: Diagnosis present

## 2015-05-10 DIAGNOSIS — Z79899 Other long term (current) drug therapy: Secondary | ICD-10-CM | POA: Diagnosis not present

## 2015-05-10 DIAGNOSIS — I080 Rheumatic disorders of both mitral and aortic valves: Secondary | ICD-10-CM | POA: Diagnosis not present

## 2015-05-10 DIAGNOSIS — I5023 Acute on chronic systolic (congestive) heart failure: Secondary | ICD-10-CM | POA: Diagnosis not present

## 2015-05-10 DIAGNOSIS — R6521 Severe sepsis with septic shock: Secondary | ICD-10-CM | POA: Diagnosis not present

## 2015-05-10 DIAGNOSIS — I25810 Atherosclerosis of coronary artery bypass graft(s) without angina pectoris: Secondary | ICD-10-CM | POA: Diagnosis present

## 2015-05-10 DIAGNOSIS — I2510 Atherosclerotic heart disease of native coronary artery without angina pectoris: Secondary | ICD-10-CM | POA: Diagnosis not present

## 2015-05-10 DIAGNOSIS — Z951 Presence of aortocoronary bypass graft: Secondary | ICD-10-CM | POA: Diagnosis not present

## 2015-05-10 DIAGNOSIS — R262 Difficulty in walking, not elsewhere classified: Secondary | ICD-10-CM | POA: Diagnosis not present

## 2015-05-10 DIAGNOSIS — A419 Sepsis, unspecified organism: Secondary | ICD-10-CM | POA: Diagnosis not present

## 2015-05-10 DIAGNOSIS — I252 Old myocardial infarction: Secondary | ICD-10-CM | POA: Diagnosis not present

## 2015-05-10 DIAGNOSIS — I5043 Acute on chronic combined systolic (congestive) and diastolic (congestive) heart failure: Secondary | ICD-10-CM | POA: Diagnosis present

## 2015-05-10 DIAGNOSIS — Z8701 Personal history of pneumonia (recurrent): Secondary | ICD-10-CM | POA: Diagnosis not present

## 2015-05-10 DIAGNOSIS — K219 Gastro-esophageal reflux disease without esophagitis: Secondary | ICD-10-CM | POA: Diagnosis present

## 2015-05-10 DIAGNOSIS — J189 Pneumonia, unspecified organism: Secondary | ICD-10-CM | POA: Diagnosis not present

## 2015-05-10 DIAGNOSIS — Z7982 Long term (current) use of aspirin: Secondary | ICD-10-CM | POA: Diagnosis not present

## 2015-05-10 DIAGNOSIS — I5021 Acute systolic (congestive) heart failure: Secondary | ICD-10-CM | POA: Diagnosis not present

## 2015-05-10 DIAGNOSIS — Z95810 Presence of automatic (implantable) cardiac defibrillator: Secondary | ICD-10-CM | POA: Diagnosis not present

## 2015-05-10 DIAGNOSIS — Z9911 Dependence on respirator [ventilator] status: Secondary | ICD-10-CM | POA: Diagnosis not present

## 2015-05-10 DIAGNOSIS — F419 Anxiety disorder, unspecified: Secondary | ICD-10-CM | POA: Diagnosis present

## 2015-05-10 DIAGNOSIS — I255 Ischemic cardiomyopathy: Secondary | ICD-10-CM | POA: Diagnosis not present

## 2015-05-10 DIAGNOSIS — J449 Chronic obstructive pulmonary disease, unspecified: Secondary | ICD-10-CM | POA: Diagnosis present

## 2015-05-10 DIAGNOSIS — E8779 Other fluid overload: Secondary | ICD-10-CM | POA: Diagnosis not present

## 2015-05-10 DIAGNOSIS — I110 Hypertensive heart disease with heart failure: Secondary | ICD-10-CM | POA: Diagnosis not present

## 2015-05-10 DIAGNOSIS — J9602 Acute respiratory failure with hypercapnia: Secondary | ICD-10-CM | POA: Diagnosis not present

## 2015-05-10 DIAGNOSIS — J9601 Acute respiratory failure with hypoxia: Secondary | ICD-10-CM | POA: Diagnosis not present

## 2015-05-10 DIAGNOSIS — K7200 Acute and subacute hepatic failure without coma: Secondary | ICD-10-CM | POA: Diagnosis not present

## 2015-05-18 DIAGNOSIS — F419 Anxiety disorder, unspecified: Secondary | ICD-10-CM | POA: Diagnosis not present

## 2015-05-18 DIAGNOSIS — I110 Hypertensive heart disease with heart failure: Secondary | ICD-10-CM | POA: Diagnosis not present

## 2015-05-18 DIAGNOSIS — D649 Anemia, unspecified: Secondary | ICD-10-CM | POA: Diagnosis not present

## 2015-05-18 DIAGNOSIS — E1165 Type 2 diabetes mellitus with hyperglycemia: Secondary | ICD-10-CM | POA: Diagnosis not present

## 2015-05-18 DIAGNOSIS — I5033 Acute on chronic diastolic (congestive) heart failure: Secondary | ICD-10-CM | POA: Diagnosis not present

## 2015-05-18 DIAGNOSIS — Z7951 Long term (current) use of inhaled steroids: Secondary | ICD-10-CM | POA: Diagnosis not present

## 2015-05-18 DIAGNOSIS — I255 Ischemic cardiomyopathy: Secondary | ICD-10-CM | POA: Diagnosis not present

## 2015-05-18 DIAGNOSIS — I2510 Atherosclerotic heart disease of native coronary artery without angina pectoris: Secondary | ICD-10-CM | POA: Diagnosis not present

## 2015-05-18 DIAGNOSIS — R2689 Other abnormalities of gait and mobility: Secondary | ICD-10-CM | POA: Diagnosis not present

## 2015-05-18 DIAGNOSIS — K219 Gastro-esophageal reflux disease without esophagitis: Secondary | ICD-10-CM | POA: Diagnosis not present

## 2015-05-18 DIAGNOSIS — Z95810 Presence of automatic (implantable) cardiac defibrillator: Secondary | ICD-10-CM | POA: Diagnosis not present

## 2015-05-18 DIAGNOSIS — J449 Chronic obstructive pulmonary disease, unspecified: Secondary | ICD-10-CM | POA: Diagnosis not present

## 2015-05-18 DIAGNOSIS — Z8701 Personal history of pneumonia (recurrent): Secondary | ICD-10-CM | POA: Diagnosis not present

## 2015-05-18 DIAGNOSIS — Z9181 History of falling: Secondary | ICD-10-CM | POA: Diagnosis not present

## 2015-05-18 DIAGNOSIS — Z955 Presence of coronary angioplasty implant and graft: Secondary | ICD-10-CM | POA: Diagnosis not present

## 2015-05-21 DIAGNOSIS — E1165 Type 2 diabetes mellitus with hyperglycemia: Secondary | ICD-10-CM | POA: Diagnosis not present

## 2015-05-21 DIAGNOSIS — I2510 Atherosclerotic heart disease of native coronary artery without angina pectoris: Secondary | ICD-10-CM | POA: Diagnosis not present

## 2015-05-21 DIAGNOSIS — I5033 Acute on chronic diastolic (congestive) heart failure: Secondary | ICD-10-CM | POA: Diagnosis not present

## 2015-05-21 DIAGNOSIS — I110 Hypertensive heart disease with heart failure: Secondary | ICD-10-CM | POA: Diagnosis not present

## 2015-05-21 DIAGNOSIS — D649 Anemia, unspecified: Secondary | ICD-10-CM | POA: Diagnosis not present

## 2015-05-21 DIAGNOSIS — R2689 Other abnormalities of gait and mobility: Secondary | ICD-10-CM | POA: Diagnosis not present

## 2015-05-22 DIAGNOSIS — E1165 Type 2 diabetes mellitus with hyperglycemia: Secondary | ICD-10-CM | POA: Diagnosis not present

## 2015-05-22 DIAGNOSIS — R2689 Other abnormalities of gait and mobility: Secondary | ICD-10-CM | POA: Diagnosis not present

## 2015-05-22 DIAGNOSIS — D649 Anemia, unspecified: Secondary | ICD-10-CM | POA: Diagnosis not present

## 2015-05-22 DIAGNOSIS — I110 Hypertensive heart disease with heart failure: Secondary | ICD-10-CM | POA: Diagnosis not present

## 2015-05-22 DIAGNOSIS — I2510 Atherosclerotic heart disease of native coronary artery without angina pectoris: Secondary | ICD-10-CM | POA: Diagnosis not present

## 2015-05-22 DIAGNOSIS — I5033 Acute on chronic diastolic (congestive) heart failure: Secondary | ICD-10-CM | POA: Diagnosis not present

## 2015-05-23 DIAGNOSIS — I2510 Atherosclerotic heart disease of native coronary artery without angina pectoris: Secondary | ICD-10-CM | POA: Diagnosis not present

## 2015-05-23 DIAGNOSIS — R2689 Other abnormalities of gait and mobility: Secondary | ICD-10-CM | POA: Diagnosis not present

## 2015-05-23 DIAGNOSIS — D649 Anemia, unspecified: Secondary | ICD-10-CM | POA: Diagnosis not present

## 2015-05-23 DIAGNOSIS — E1165 Type 2 diabetes mellitus with hyperglycemia: Secondary | ICD-10-CM | POA: Diagnosis not present

## 2015-05-23 DIAGNOSIS — I5033 Acute on chronic diastolic (congestive) heart failure: Secondary | ICD-10-CM | POA: Diagnosis not present

## 2015-05-23 DIAGNOSIS — I110 Hypertensive heart disease with heart failure: Secondary | ICD-10-CM | POA: Diagnosis not present

## 2015-05-24 DIAGNOSIS — Z951 Presence of aortocoronary bypass graft: Secondary | ICD-10-CM

## 2015-05-24 DIAGNOSIS — Z86711 Personal history of pulmonary embolism: Secondary | ICD-10-CM | POA: Diagnosis not present

## 2015-05-24 DIAGNOSIS — I5023 Acute on chronic systolic (congestive) heart failure: Secondary | ICD-10-CM | POA: Diagnosis not present

## 2015-05-24 DIAGNOSIS — J441 Chronic obstructive pulmonary disease with (acute) exacerbation: Secondary | ICD-10-CM | POA: Diagnosis not present

## 2015-05-24 DIAGNOSIS — Z95810 Presence of automatic (implantable) cardiac defibrillator: Secondary | ICD-10-CM | POA: Diagnosis not present

## 2015-05-24 DIAGNOSIS — I255 Ischemic cardiomyopathy: Secondary | ICD-10-CM

## 2015-05-24 HISTORY — DX: Ischemic cardiomyopathy: I25.5

## 2015-05-24 HISTORY — DX: Presence of aortocoronary bypass graft: Z95.1

## 2015-05-25 DIAGNOSIS — D649 Anemia, unspecified: Secondary | ICD-10-CM | POA: Diagnosis not present

## 2015-05-25 DIAGNOSIS — E1165 Type 2 diabetes mellitus with hyperglycemia: Secondary | ICD-10-CM | POA: Diagnosis not present

## 2015-05-25 DIAGNOSIS — I5033 Acute on chronic diastolic (congestive) heart failure: Secondary | ICD-10-CM | POA: Diagnosis not present

## 2015-05-25 DIAGNOSIS — R2689 Other abnormalities of gait and mobility: Secondary | ICD-10-CM | POA: Diagnosis not present

## 2015-05-25 DIAGNOSIS — I110 Hypertensive heart disease with heart failure: Secondary | ICD-10-CM | POA: Diagnosis not present

## 2015-05-25 DIAGNOSIS — I2510 Atherosclerotic heart disease of native coronary artery without angina pectoris: Secondary | ICD-10-CM | POA: Diagnosis not present

## 2015-05-28 DIAGNOSIS — D649 Anemia, unspecified: Secondary | ICD-10-CM | POA: Diagnosis not present

## 2015-05-28 DIAGNOSIS — R2689 Other abnormalities of gait and mobility: Secondary | ICD-10-CM | POA: Diagnosis not present

## 2015-05-28 DIAGNOSIS — I2510 Atherosclerotic heart disease of native coronary artery without angina pectoris: Secondary | ICD-10-CM | POA: Diagnosis not present

## 2015-05-28 DIAGNOSIS — I110 Hypertensive heart disease with heart failure: Secondary | ICD-10-CM | POA: Diagnosis not present

## 2015-05-28 DIAGNOSIS — I5033 Acute on chronic diastolic (congestive) heart failure: Secondary | ICD-10-CM | POA: Diagnosis not present

## 2015-05-28 DIAGNOSIS — E1165 Type 2 diabetes mellitus with hyperglycemia: Secondary | ICD-10-CM | POA: Diagnosis not present

## 2015-05-29 DIAGNOSIS — D509 Iron deficiency anemia, unspecified: Secondary | ICD-10-CM | POA: Diagnosis not present

## 2015-05-29 DIAGNOSIS — I509 Heart failure, unspecified: Secondary | ICD-10-CM | POA: Diagnosis not present

## 2015-05-29 DIAGNOSIS — Z6823 Body mass index (BMI) 23.0-23.9, adult: Secondary | ICD-10-CM | POA: Diagnosis not present

## 2015-05-29 DIAGNOSIS — F419 Anxiety disorder, unspecified: Secondary | ICD-10-CM | POA: Diagnosis not present

## 2015-05-29 DIAGNOSIS — J439 Emphysema, unspecified: Secondary | ICD-10-CM | POA: Diagnosis not present

## 2015-05-29 DIAGNOSIS — R262 Difficulty in walking, not elsewhere classified: Secondary | ICD-10-CM | POA: Diagnosis not present

## 2015-05-29 DIAGNOSIS — E876 Hypokalemia: Secondary | ICD-10-CM | POA: Diagnosis not present

## 2015-05-29 DIAGNOSIS — A419 Sepsis, unspecified organism: Secondary | ICD-10-CM | POA: Diagnosis not present

## 2015-05-30 DIAGNOSIS — D649 Anemia, unspecified: Secondary | ICD-10-CM | POA: Diagnosis not present

## 2015-05-30 DIAGNOSIS — R2689 Other abnormalities of gait and mobility: Secondary | ICD-10-CM | POA: Diagnosis not present

## 2015-05-30 DIAGNOSIS — I5033 Acute on chronic diastolic (congestive) heart failure: Secondary | ICD-10-CM | POA: Diagnosis not present

## 2015-05-30 DIAGNOSIS — E1165 Type 2 diabetes mellitus with hyperglycemia: Secondary | ICD-10-CM | POA: Diagnosis not present

## 2015-05-30 DIAGNOSIS — I110 Hypertensive heart disease with heart failure: Secondary | ICD-10-CM | POA: Diagnosis not present

## 2015-05-30 DIAGNOSIS — I2510 Atherosclerotic heart disease of native coronary artery without angina pectoris: Secondary | ICD-10-CM | POA: Diagnosis not present

## 2015-06-01 DIAGNOSIS — I5033 Acute on chronic diastolic (congestive) heart failure: Secondary | ICD-10-CM | POA: Diagnosis not present

## 2015-06-01 DIAGNOSIS — I2510 Atherosclerotic heart disease of native coronary artery without angina pectoris: Secondary | ICD-10-CM | POA: Diagnosis not present

## 2015-06-01 DIAGNOSIS — I110 Hypertensive heart disease with heart failure: Secondary | ICD-10-CM | POA: Diagnosis not present

## 2015-06-01 DIAGNOSIS — E1165 Type 2 diabetes mellitus with hyperglycemia: Secondary | ICD-10-CM | POA: Diagnosis not present

## 2015-06-01 DIAGNOSIS — R2689 Other abnormalities of gait and mobility: Secondary | ICD-10-CM | POA: Diagnosis not present

## 2015-06-01 DIAGNOSIS — D649 Anemia, unspecified: Secondary | ICD-10-CM | POA: Diagnosis not present

## 2015-06-04 DIAGNOSIS — R2689 Other abnormalities of gait and mobility: Secondary | ICD-10-CM | POA: Diagnosis not present

## 2015-06-04 DIAGNOSIS — I110 Hypertensive heart disease with heart failure: Secondary | ICD-10-CM | POA: Diagnosis not present

## 2015-06-04 DIAGNOSIS — I5033 Acute on chronic diastolic (congestive) heart failure: Secondary | ICD-10-CM | POA: Diagnosis not present

## 2015-06-04 DIAGNOSIS — E1165 Type 2 diabetes mellitus with hyperglycemia: Secondary | ICD-10-CM | POA: Diagnosis not present

## 2015-06-04 DIAGNOSIS — I2510 Atherosclerotic heart disease of native coronary artery without angina pectoris: Secondary | ICD-10-CM | POA: Diagnosis not present

## 2015-06-04 DIAGNOSIS — D649 Anemia, unspecified: Secondary | ICD-10-CM | POA: Diagnosis not present

## 2015-06-06 DIAGNOSIS — R2689 Other abnormalities of gait and mobility: Secondary | ICD-10-CM | POA: Diagnosis not present

## 2015-06-06 DIAGNOSIS — D649 Anemia, unspecified: Secondary | ICD-10-CM | POA: Diagnosis not present

## 2015-06-06 DIAGNOSIS — I110 Hypertensive heart disease with heart failure: Secondary | ICD-10-CM | POA: Diagnosis not present

## 2015-06-06 DIAGNOSIS — E1165 Type 2 diabetes mellitus with hyperglycemia: Secondary | ICD-10-CM | POA: Diagnosis not present

## 2015-06-06 DIAGNOSIS — I5033 Acute on chronic diastolic (congestive) heart failure: Secondary | ICD-10-CM | POA: Diagnosis not present

## 2015-06-06 DIAGNOSIS — I2510 Atherosclerotic heart disease of native coronary artery without angina pectoris: Secondary | ICD-10-CM | POA: Diagnosis not present

## 2015-06-08 DIAGNOSIS — I110 Hypertensive heart disease with heart failure: Secondary | ICD-10-CM | POA: Diagnosis not present

## 2015-06-08 DIAGNOSIS — D649 Anemia, unspecified: Secondary | ICD-10-CM | POA: Diagnosis not present

## 2015-06-08 DIAGNOSIS — I2510 Atherosclerotic heart disease of native coronary artery without angina pectoris: Secondary | ICD-10-CM | POA: Diagnosis not present

## 2015-06-08 DIAGNOSIS — R2689 Other abnormalities of gait and mobility: Secondary | ICD-10-CM | POA: Diagnosis not present

## 2015-06-08 DIAGNOSIS — I5033 Acute on chronic diastolic (congestive) heart failure: Secondary | ICD-10-CM | POA: Diagnosis not present

## 2015-06-08 DIAGNOSIS — E1165 Type 2 diabetes mellitus with hyperglycemia: Secondary | ICD-10-CM | POA: Diagnosis not present

## 2015-06-11 DIAGNOSIS — E1165 Type 2 diabetes mellitus with hyperglycemia: Secondary | ICD-10-CM | POA: Diagnosis not present

## 2015-06-11 DIAGNOSIS — R2689 Other abnormalities of gait and mobility: Secondary | ICD-10-CM | POA: Diagnosis not present

## 2015-06-11 DIAGNOSIS — I110 Hypertensive heart disease with heart failure: Secondary | ICD-10-CM | POA: Diagnosis not present

## 2015-06-11 DIAGNOSIS — D649 Anemia, unspecified: Secondary | ICD-10-CM | POA: Diagnosis not present

## 2015-06-11 DIAGNOSIS — I5033 Acute on chronic diastolic (congestive) heart failure: Secondary | ICD-10-CM | POA: Diagnosis not present

## 2015-06-11 DIAGNOSIS — I2510 Atherosclerotic heart disease of native coronary artery without angina pectoris: Secondary | ICD-10-CM | POA: Diagnosis not present

## 2015-06-14 DIAGNOSIS — I110 Hypertensive heart disease with heart failure: Secondary | ICD-10-CM | POA: Diagnosis not present

## 2015-06-14 DIAGNOSIS — R2689 Other abnormalities of gait and mobility: Secondary | ICD-10-CM | POA: Diagnosis not present

## 2015-06-14 DIAGNOSIS — I2510 Atherosclerotic heart disease of native coronary artery without angina pectoris: Secondary | ICD-10-CM | POA: Diagnosis not present

## 2015-06-14 DIAGNOSIS — I5033 Acute on chronic diastolic (congestive) heart failure: Secondary | ICD-10-CM | POA: Diagnosis not present

## 2015-06-14 DIAGNOSIS — D649 Anemia, unspecified: Secondary | ICD-10-CM | POA: Diagnosis not present

## 2015-06-14 DIAGNOSIS — E1165 Type 2 diabetes mellitus with hyperglycemia: Secondary | ICD-10-CM | POA: Diagnosis not present

## 2015-06-18 DIAGNOSIS — I2510 Atherosclerotic heart disease of native coronary artery without angina pectoris: Secondary | ICD-10-CM | POA: Diagnosis not present

## 2015-06-18 DIAGNOSIS — Z5181 Encounter for therapeutic drug level monitoring: Secondary | ICD-10-CM | POA: Diagnosis not present

## 2015-06-18 DIAGNOSIS — I110 Hypertensive heart disease with heart failure: Secondary | ICD-10-CM | POA: Diagnosis not present

## 2015-06-18 DIAGNOSIS — R2689 Other abnormalities of gait and mobility: Secondary | ICD-10-CM | POA: Diagnosis not present

## 2015-06-18 DIAGNOSIS — D649 Anemia, unspecified: Secondary | ICD-10-CM | POA: Diagnosis not present

## 2015-06-18 DIAGNOSIS — I5033 Acute on chronic diastolic (congestive) heart failure: Secondary | ICD-10-CM | POA: Diagnosis not present

## 2015-06-18 DIAGNOSIS — E1165 Type 2 diabetes mellitus with hyperglycemia: Secondary | ICD-10-CM | POA: Diagnosis not present

## 2015-06-21 DIAGNOSIS — E1165 Type 2 diabetes mellitus with hyperglycemia: Secondary | ICD-10-CM | POA: Diagnosis not present

## 2015-06-21 DIAGNOSIS — I2510 Atherosclerotic heart disease of native coronary artery without angina pectoris: Secondary | ICD-10-CM | POA: Diagnosis not present

## 2015-06-21 DIAGNOSIS — Z9181 History of falling: Secondary | ICD-10-CM | POA: Diagnosis not present

## 2015-06-21 DIAGNOSIS — Z6823 Body mass index (BMI) 23.0-23.9, adult: Secondary | ICD-10-CM | POA: Diagnosis not present

## 2015-06-21 DIAGNOSIS — I110 Hypertensive heart disease with heart failure: Secondary | ICD-10-CM | POA: Diagnosis not present

## 2015-06-21 DIAGNOSIS — R2689 Other abnormalities of gait and mobility: Secondary | ICD-10-CM | POA: Diagnosis not present

## 2015-06-21 DIAGNOSIS — D649 Anemia, unspecified: Secondary | ICD-10-CM | POA: Diagnosis not present

## 2015-06-21 DIAGNOSIS — I5033 Acute on chronic diastolic (congestive) heart failure: Secondary | ICD-10-CM | POA: Diagnosis not present

## 2015-06-21 DIAGNOSIS — J439 Emphysema, unspecified: Secondary | ICD-10-CM | POA: Diagnosis not present

## 2015-06-25 DIAGNOSIS — I5033 Acute on chronic diastolic (congestive) heart failure: Secondary | ICD-10-CM | POA: Diagnosis not present

## 2015-06-25 DIAGNOSIS — I110 Hypertensive heart disease with heart failure: Secondary | ICD-10-CM | POA: Diagnosis not present

## 2015-06-25 DIAGNOSIS — D649 Anemia, unspecified: Secondary | ICD-10-CM | POA: Diagnosis not present

## 2015-06-25 DIAGNOSIS — R2689 Other abnormalities of gait and mobility: Secondary | ICD-10-CM | POA: Diagnosis not present

## 2015-06-25 DIAGNOSIS — E1165 Type 2 diabetes mellitus with hyperglycemia: Secondary | ICD-10-CM | POA: Diagnosis not present

## 2015-06-25 DIAGNOSIS — I2510 Atherosclerotic heart disease of native coronary artery without angina pectoris: Secondary | ICD-10-CM | POA: Diagnosis not present

## 2015-07-04 DIAGNOSIS — I5033 Acute on chronic diastolic (congestive) heart failure: Secondary | ICD-10-CM | POA: Diagnosis not present

## 2015-07-04 DIAGNOSIS — R2689 Other abnormalities of gait and mobility: Secondary | ICD-10-CM | POA: Diagnosis not present

## 2015-07-04 DIAGNOSIS — I110 Hypertensive heart disease with heart failure: Secondary | ICD-10-CM | POA: Diagnosis not present

## 2015-07-04 DIAGNOSIS — I2510 Atherosclerotic heart disease of native coronary artery without angina pectoris: Secondary | ICD-10-CM | POA: Diagnosis not present

## 2015-07-04 DIAGNOSIS — D649 Anemia, unspecified: Secondary | ICD-10-CM | POA: Diagnosis not present

## 2015-07-04 DIAGNOSIS — E1165 Type 2 diabetes mellitus with hyperglycemia: Secondary | ICD-10-CM | POA: Diagnosis not present

## 2015-07-13 DIAGNOSIS — R2689 Other abnormalities of gait and mobility: Secondary | ICD-10-CM | POA: Diagnosis not present

## 2015-07-13 DIAGNOSIS — I110 Hypertensive heart disease with heart failure: Secondary | ICD-10-CM | POA: Diagnosis not present

## 2015-07-13 DIAGNOSIS — I5033 Acute on chronic diastolic (congestive) heart failure: Secondary | ICD-10-CM | POA: Diagnosis not present

## 2015-07-13 DIAGNOSIS — D649 Anemia, unspecified: Secondary | ICD-10-CM | POA: Diagnosis not present

## 2015-07-13 DIAGNOSIS — I2510 Atherosclerotic heart disease of native coronary artery without angina pectoris: Secondary | ICD-10-CM | POA: Diagnosis not present

## 2015-07-13 DIAGNOSIS — E1165 Type 2 diabetes mellitus with hyperglycemia: Secondary | ICD-10-CM | POA: Diagnosis not present

## 2015-07-17 DIAGNOSIS — I2510 Atherosclerotic heart disease of native coronary artery without angina pectoris: Secondary | ICD-10-CM | POA: Diagnosis not present

## 2015-07-17 DIAGNOSIS — D649 Anemia, unspecified: Secondary | ICD-10-CM | POA: Diagnosis not present

## 2015-07-17 DIAGNOSIS — Z7951 Long term (current) use of inhaled steroids: Secondary | ICD-10-CM | POA: Diagnosis not present

## 2015-07-17 DIAGNOSIS — Z8701 Personal history of pneumonia (recurrent): Secondary | ICD-10-CM | POA: Diagnosis not present

## 2015-07-17 DIAGNOSIS — Z955 Presence of coronary angioplasty implant and graft: Secondary | ICD-10-CM | POA: Diagnosis not present

## 2015-07-17 DIAGNOSIS — I110 Hypertensive heart disease with heart failure: Secondary | ICD-10-CM | POA: Diagnosis not present

## 2015-07-17 DIAGNOSIS — E1165 Type 2 diabetes mellitus with hyperglycemia: Secondary | ICD-10-CM | POA: Diagnosis not present

## 2015-07-17 DIAGNOSIS — Z9181 History of falling: Secondary | ICD-10-CM | POA: Diagnosis not present

## 2015-07-17 DIAGNOSIS — K219 Gastro-esophageal reflux disease without esophagitis: Secondary | ICD-10-CM | POA: Diagnosis not present

## 2015-07-17 DIAGNOSIS — F419 Anxiety disorder, unspecified: Secondary | ICD-10-CM | POA: Diagnosis not present

## 2015-07-17 DIAGNOSIS — J449 Chronic obstructive pulmonary disease, unspecified: Secondary | ICD-10-CM | POA: Diagnosis not present

## 2015-07-17 DIAGNOSIS — I255 Ischemic cardiomyopathy: Secondary | ICD-10-CM | POA: Diagnosis not present

## 2015-07-17 DIAGNOSIS — R2689 Other abnormalities of gait and mobility: Secondary | ICD-10-CM | POA: Diagnosis not present

## 2015-07-17 DIAGNOSIS — I5033 Acute on chronic diastolic (congestive) heart failure: Secondary | ICD-10-CM | POA: Diagnosis not present

## 2015-07-17 DIAGNOSIS — Z95810 Presence of automatic (implantable) cardiac defibrillator: Secondary | ICD-10-CM | POA: Diagnosis not present

## 2015-07-19 DIAGNOSIS — I110 Hypertensive heart disease with heart failure: Secondary | ICD-10-CM | POA: Diagnosis not present

## 2015-07-19 DIAGNOSIS — I5033 Acute on chronic diastolic (congestive) heart failure: Secondary | ICD-10-CM | POA: Diagnosis not present

## 2015-07-19 DIAGNOSIS — E1165 Type 2 diabetes mellitus with hyperglycemia: Secondary | ICD-10-CM | POA: Diagnosis not present

## 2015-07-19 DIAGNOSIS — D649 Anemia, unspecified: Secondary | ICD-10-CM | POA: Diagnosis not present

## 2015-07-19 DIAGNOSIS — I2510 Atherosclerotic heart disease of native coronary artery without angina pectoris: Secondary | ICD-10-CM | POA: Diagnosis not present

## 2015-07-19 DIAGNOSIS — R2689 Other abnormalities of gait and mobility: Secondary | ICD-10-CM | POA: Diagnosis not present

## 2015-07-26 DIAGNOSIS — D649 Anemia, unspecified: Secondary | ICD-10-CM | POA: Diagnosis not present

## 2015-07-26 DIAGNOSIS — R2689 Other abnormalities of gait and mobility: Secondary | ICD-10-CM | POA: Diagnosis not present

## 2015-07-26 DIAGNOSIS — I5033 Acute on chronic diastolic (congestive) heart failure: Secondary | ICD-10-CM | POA: Diagnosis not present

## 2015-07-26 DIAGNOSIS — E1165 Type 2 diabetes mellitus with hyperglycemia: Secondary | ICD-10-CM | POA: Diagnosis not present

## 2015-07-26 DIAGNOSIS — I2510 Atherosclerotic heart disease of native coronary artery without angina pectoris: Secondary | ICD-10-CM | POA: Diagnosis not present

## 2015-07-26 DIAGNOSIS — I110 Hypertensive heart disease with heart failure: Secondary | ICD-10-CM | POA: Diagnosis not present

## 2015-08-01 DIAGNOSIS — I5023 Acute on chronic systolic (congestive) heart failure: Secondary | ICD-10-CM | POA: Diagnosis not present

## 2015-08-01 DIAGNOSIS — Z951 Presence of aortocoronary bypass graft: Secondary | ICD-10-CM | POA: Diagnosis not present

## 2015-08-01 DIAGNOSIS — J441 Chronic obstructive pulmonary disease with (acute) exacerbation: Secondary | ICD-10-CM | POA: Diagnosis not present

## 2015-08-01 DIAGNOSIS — Z95810 Presence of automatic (implantable) cardiac defibrillator: Secondary | ICD-10-CM | POA: Diagnosis not present

## 2015-08-01 DIAGNOSIS — I255 Ischemic cardiomyopathy: Secondary | ICD-10-CM | POA: Diagnosis not present

## 2015-08-02 DIAGNOSIS — I5033 Acute on chronic diastolic (congestive) heart failure: Secondary | ICD-10-CM | POA: Diagnosis not present

## 2015-08-02 DIAGNOSIS — R2689 Other abnormalities of gait and mobility: Secondary | ICD-10-CM | POA: Diagnosis not present

## 2015-08-02 DIAGNOSIS — I2510 Atherosclerotic heart disease of native coronary artery without angina pectoris: Secondary | ICD-10-CM | POA: Diagnosis not present

## 2015-08-02 DIAGNOSIS — E1165 Type 2 diabetes mellitus with hyperglycemia: Secondary | ICD-10-CM | POA: Diagnosis not present

## 2015-08-02 DIAGNOSIS — D649 Anemia, unspecified: Secondary | ICD-10-CM | POA: Diagnosis not present

## 2015-08-02 DIAGNOSIS — I110 Hypertensive heart disease with heart failure: Secondary | ICD-10-CM | POA: Diagnosis not present

## 2015-08-08 DIAGNOSIS — Z95810 Presence of automatic (implantable) cardiac defibrillator: Secondary | ICD-10-CM | POA: Diagnosis not present

## 2015-08-08 DIAGNOSIS — I5023 Acute on chronic systolic (congestive) heart failure: Secondary | ICD-10-CM | POA: Diagnosis not present

## 2015-08-09 DIAGNOSIS — R2689 Other abnormalities of gait and mobility: Secondary | ICD-10-CM | POA: Diagnosis not present

## 2015-08-09 DIAGNOSIS — D649 Anemia, unspecified: Secondary | ICD-10-CM | POA: Diagnosis not present

## 2015-08-09 DIAGNOSIS — I5033 Acute on chronic diastolic (congestive) heart failure: Secondary | ICD-10-CM | POA: Diagnosis not present

## 2015-08-09 DIAGNOSIS — I110 Hypertensive heart disease with heart failure: Secondary | ICD-10-CM | POA: Diagnosis not present

## 2015-08-09 DIAGNOSIS — E1165 Type 2 diabetes mellitus with hyperglycemia: Secondary | ICD-10-CM | POA: Diagnosis not present

## 2015-08-09 DIAGNOSIS — I2510 Atherosclerotic heart disease of native coronary artery without angina pectoris: Secondary | ICD-10-CM | POA: Diagnosis not present

## 2015-08-10 DIAGNOSIS — E875 Hyperkalemia: Secondary | ICD-10-CM | POA: Diagnosis not present

## 2015-08-10 DIAGNOSIS — I110 Hypertensive heart disease with heart failure: Secondary | ICD-10-CM | POA: Diagnosis not present

## 2015-08-10 DIAGNOSIS — E1165 Type 2 diabetes mellitus with hyperglycemia: Secondary | ICD-10-CM | POA: Diagnosis not present

## 2015-08-10 DIAGNOSIS — D649 Anemia, unspecified: Secondary | ICD-10-CM | POA: Diagnosis not present

## 2015-08-10 DIAGNOSIS — I5033 Acute on chronic diastolic (congestive) heart failure: Secondary | ICD-10-CM | POA: Diagnosis not present

## 2015-08-10 DIAGNOSIS — R2689 Other abnormalities of gait and mobility: Secondary | ICD-10-CM | POA: Diagnosis not present

## 2015-08-10 DIAGNOSIS — I2510 Atherosclerotic heart disease of native coronary artery without angina pectoris: Secondary | ICD-10-CM | POA: Diagnosis not present

## 2015-08-10 DIAGNOSIS — R7989 Other specified abnormal findings of blood chemistry: Secondary | ICD-10-CM | POA: Diagnosis not present

## 2015-08-14 DIAGNOSIS — F419 Anxiety disorder, unspecified: Secondary | ICD-10-CM | POA: Diagnosis not present

## 2015-08-14 DIAGNOSIS — I2510 Atherosclerotic heart disease of native coronary artery without angina pectoris: Secondary | ICD-10-CM | POA: Diagnosis not present

## 2015-08-14 DIAGNOSIS — Z789 Other specified health status: Secondary | ICD-10-CM | POA: Diagnosis not present

## 2015-08-14 DIAGNOSIS — Z79899 Other long term (current) drug therapy: Secondary | ICD-10-CM | POA: Diagnosis not present

## 2015-08-14 DIAGNOSIS — J019 Acute sinusitis, unspecified: Secondary | ICD-10-CM | POA: Diagnosis not present

## 2015-08-14 DIAGNOSIS — Z6823 Body mass index (BMI) 23.0-23.9, adult: Secondary | ICD-10-CM | POA: Diagnosis not present

## 2015-08-14 DIAGNOSIS — Z0000 Encounter for general adult medical examination without abnormal findings: Secondary | ICD-10-CM | POA: Diagnosis not present

## 2015-08-14 DIAGNOSIS — J441 Chronic obstructive pulmonary disease with (acute) exacerbation: Secondary | ICD-10-CM | POA: Diagnosis not present

## 2015-08-14 DIAGNOSIS — I509 Heart failure, unspecified: Secondary | ICD-10-CM | POA: Diagnosis not present

## 2015-08-16 DIAGNOSIS — D649 Anemia, unspecified: Secondary | ICD-10-CM | POA: Diagnosis not present

## 2015-08-16 DIAGNOSIS — I2510 Atherosclerotic heart disease of native coronary artery without angina pectoris: Secondary | ICD-10-CM | POA: Diagnosis not present

## 2015-08-16 DIAGNOSIS — E1165 Type 2 diabetes mellitus with hyperglycemia: Secondary | ICD-10-CM | POA: Diagnosis not present

## 2015-08-16 DIAGNOSIS — I5033 Acute on chronic diastolic (congestive) heart failure: Secondary | ICD-10-CM | POA: Diagnosis not present

## 2015-08-16 DIAGNOSIS — R2689 Other abnormalities of gait and mobility: Secondary | ICD-10-CM | POA: Diagnosis not present

## 2015-08-16 DIAGNOSIS — I110 Hypertensive heart disease with heart failure: Secondary | ICD-10-CM | POA: Diagnosis not present

## 2015-08-23 DIAGNOSIS — D649 Anemia, unspecified: Secondary | ICD-10-CM | POA: Diagnosis not present

## 2015-08-23 DIAGNOSIS — E1165 Type 2 diabetes mellitus with hyperglycemia: Secondary | ICD-10-CM | POA: Diagnosis not present

## 2015-08-23 DIAGNOSIS — I5033 Acute on chronic diastolic (congestive) heart failure: Secondary | ICD-10-CM | POA: Diagnosis not present

## 2015-08-23 DIAGNOSIS — R2689 Other abnormalities of gait and mobility: Secondary | ICD-10-CM | POA: Diagnosis not present

## 2015-08-23 DIAGNOSIS — I110 Hypertensive heart disease with heart failure: Secondary | ICD-10-CM | POA: Diagnosis not present

## 2015-08-23 DIAGNOSIS — I2510 Atherosclerotic heart disease of native coronary artery without angina pectoris: Secondary | ICD-10-CM | POA: Diagnosis not present

## 2015-08-30 DIAGNOSIS — R2689 Other abnormalities of gait and mobility: Secondary | ICD-10-CM | POA: Diagnosis not present

## 2015-08-30 DIAGNOSIS — E1165 Type 2 diabetes mellitus with hyperglycemia: Secondary | ICD-10-CM | POA: Diagnosis not present

## 2015-08-30 DIAGNOSIS — I5023 Acute on chronic systolic (congestive) heart failure: Secondary | ICD-10-CM | POA: Diagnosis not present

## 2015-08-30 DIAGNOSIS — I2510 Atherosclerotic heart disease of native coronary artery without angina pectoris: Secondary | ICD-10-CM | POA: Diagnosis not present

## 2015-08-30 DIAGNOSIS — D649 Anemia, unspecified: Secondary | ICD-10-CM | POA: Diagnosis not present

## 2015-08-30 DIAGNOSIS — I110 Hypertensive heart disease with heart failure: Secondary | ICD-10-CM | POA: Diagnosis not present

## 2015-08-30 DIAGNOSIS — I5033 Acute on chronic diastolic (congestive) heart failure: Secondary | ICD-10-CM | POA: Diagnosis not present

## 2015-09-06 DIAGNOSIS — D649 Anemia, unspecified: Secondary | ICD-10-CM | POA: Diagnosis not present

## 2015-09-06 DIAGNOSIS — I2510 Atherosclerotic heart disease of native coronary artery without angina pectoris: Secondary | ICD-10-CM | POA: Diagnosis not present

## 2015-09-06 DIAGNOSIS — R2689 Other abnormalities of gait and mobility: Secondary | ICD-10-CM | POA: Diagnosis not present

## 2015-09-06 DIAGNOSIS — I110 Hypertensive heart disease with heart failure: Secondary | ICD-10-CM | POA: Diagnosis not present

## 2015-09-06 DIAGNOSIS — I5033 Acute on chronic diastolic (congestive) heart failure: Secondary | ICD-10-CM | POA: Diagnosis not present

## 2015-09-06 DIAGNOSIS — E1165 Type 2 diabetes mellitus with hyperglycemia: Secondary | ICD-10-CM | POA: Diagnosis not present

## 2015-09-13 DIAGNOSIS — E1165 Type 2 diabetes mellitus with hyperglycemia: Secondary | ICD-10-CM | POA: Diagnosis not present

## 2015-09-13 DIAGNOSIS — D649 Anemia, unspecified: Secondary | ICD-10-CM | POA: Diagnosis not present

## 2015-09-13 DIAGNOSIS — R2689 Other abnormalities of gait and mobility: Secondary | ICD-10-CM | POA: Diagnosis not present

## 2015-09-13 DIAGNOSIS — I110 Hypertensive heart disease with heart failure: Secondary | ICD-10-CM | POA: Diagnosis not present

## 2015-09-13 DIAGNOSIS — I5033 Acute on chronic diastolic (congestive) heart failure: Secondary | ICD-10-CM | POA: Diagnosis not present

## 2015-09-13 DIAGNOSIS — I2510 Atherosclerotic heart disease of native coronary artery without angina pectoris: Secondary | ICD-10-CM | POA: Diagnosis not present

## 2015-09-20 DIAGNOSIS — I509 Heart failure, unspecified: Secondary | ICD-10-CM | POA: Diagnosis not present

## 2015-09-20 DIAGNOSIS — Z6822 Body mass index (BMI) 22.0-22.9, adult: Secondary | ICD-10-CM | POA: Diagnosis not present

## 2015-09-20 DIAGNOSIS — M545 Low back pain: Secondary | ICD-10-CM | POA: Diagnosis not present

## 2015-09-20 DIAGNOSIS — Z9181 History of falling: Secondary | ICD-10-CM | POA: Diagnosis not present

## 2015-09-24 DIAGNOSIS — S3993XA Unspecified injury of pelvis, initial encounter: Secondary | ICD-10-CM | POA: Diagnosis not present

## 2015-09-24 DIAGNOSIS — M545 Low back pain: Secondary | ICD-10-CM | POA: Diagnosis not present

## 2015-09-24 DIAGNOSIS — S3992XA Unspecified injury of lower back, initial encounter: Secondary | ICD-10-CM | POA: Diagnosis not present

## 2015-10-01 DIAGNOSIS — S32018A Other fracture of first lumbar vertebra, initial encounter for closed fracture: Secondary | ICD-10-CM | POA: Diagnosis not present

## 2015-10-01 DIAGNOSIS — D3502 Benign neoplasm of left adrenal gland: Secondary | ICD-10-CM | POA: Diagnosis not present

## 2015-10-01 DIAGNOSIS — S32010A Wedge compression fracture of first lumbar vertebra, initial encounter for closed fracture: Secondary | ICD-10-CM | POA: Diagnosis not present

## 2015-10-01 DIAGNOSIS — M5136 Other intervertebral disc degeneration, lumbar region: Secondary | ICD-10-CM | POA: Diagnosis not present

## 2015-10-01 DIAGNOSIS — I700 Atherosclerosis of aorta: Secondary | ICD-10-CM | POA: Diagnosis not present

## 2015-10-07 DIAGNOSIS — L500 Allergic urticaria: Secondary | ICD-10-CM | POA: Diagnosis not present

## 2015-10-07 DIAGNOSIS — R069 Unspecified abnormalities of breathing: Secondary | ICD-10-CM | POA: Diagnosis not present

## 2015-10-07 DIAGNOSIS — J441 Chronic obstructive pulmonary disease with (acute) exacerbation: Secondary | ICD-10-CM | POA: Diagnosis not present

## 2015-10-07 DIAGNOSIS — L509 Urticaria, unspecified: Secondary | ICD-10-CM | POA: Diagnosis not present

## 2015-10-07 DIAGNOSIS — R0602 Shortness of breath: Secondary | ICD-10-CM | POA: Diagnosis not present

## 2015-10-18 DIAGNOSIS — S32000A Wedge compression fracture of unspecified lumbar vertebra, initial encounter for closed fracture: Secondary | ICD-10-CM | POA: Diagnosis not present

## 2015-10-18 DIAGNOSIS — Z6822 Body mass index (BMI) 22.0-22.9, adult: Secondary | ICD-10-CM | POA: Diagnosis not present

## 2015-10-18 DIAGNOSIS — F419 Anxiety disorder, unspecified: Secondary | ICD-10-CM | POA: Diagnosis not present

## 2015-10-18 DIAGNOSIS — J441 Chronic obstructive pulmonary disease with (acute) exacerbation: Secondary | ICD-10-CM | POA: Diagnosis not present

## 2015-10-18 DIAGNOSIS — I509 Heart failure, unspecified: Secondary | ICD-10-CM | POA: Diagnosis not present

## 2015-10-18 DIAGNOSIS — Z95810 Presence of automatic (implantable) cardiac defibrillator: Secondary | ICD-10-CM | POA: Diagnosis not present

## 2015-10-18 DIAGNOSIS — Z79899 Other long term (current) drug therapy: Secondary | ICD-10-CM | POA: Diagnosis not present

## 2015-10-18 DIAGNOSIS — I255 Ischemic cardiomyopathy: Secondary | ICD-10-CM | POA: Diagnosis not present

## 2015-10-25 ENCOUNTER — Telehealth (HOSPITAL_COMMUNITY): Payer: Self-pay

## 2015-10-25 NOTE — Telephone Encounter (Signed)
Called to schedule consult, left a message for pt to return call. AW

## 2015-10-26 ENCOUNTER — Other Ambulatory Visit (HOSPITAL_COMMUNITY): Payer: Self-pay | Admitting: Interventional Radiology

## 2015-10-26 DIAGNOSIS — IMO0002 Reserved for concepts with insufficient information to code with codable children: Secondary | ICD-10-CM

## 2015-11-08 ENCOUNTER — Ambulatory Visit (HOSPITAL_COMMUNITY)
Admission: RE | Admit: 2015-11-08 | Discharge: 2015-11-08 | Disposition: A | Discharge status: Home / Self Care | Payer: Medicare Other | Source: Ambulatory Visit | Attending: Interventional Radiology | Admitting: Interventional Radiology

## 2015-11-08 DIAGNOSIS — M4856XA Collapsed vertebra, not elsewhere classified, lumbar region, initial encounter for fracture: Secondary | ICD-10-CM | POA: Diagnosis not present

## 2015-11-08 DIAGNOSIS — IMO0002 Reserved for concepts with insufficient information to code with codable children: Secondary | ICD-10-CM

## 2015-11-08 HISTORY — PX: IR GENERIC HISTORICAL: IMG1180011

## 2015-11-09 ENCOUNTER — Other Ambulatory Visit: Payer: Self-pay | Admitting: Radiology

## 2015-11-09 ENCOUNTER — Other Ambulatory Visit (HOSPITAL_COMMUNITY): Payer: Self-pay | Admitting: Interventional Radiology

## 2015-11-09 DIAGNOSIS — IMO0002 Reserved for concepts with insufficient information to code with codable children: Secondary | ICD-10-CM

## 2015-11-09 DIAGNOSIS — M549 Dorsalgia, unspecified: Secondary | ICD-10-CM

## 2015-11-13 ENCOUNTER — Other Ambulatory Visit (HOSPITAL_COMMUNITY): Payer: Self-pay | Admitting: Interventional Radiology

## 2015-11-13 ENCOUNTER — Ambulatory Visit (HOSPITAL_COMMUNITY)
Admission: RE | Admit: 2015-11-13 | Discharge: 2015-11-13 | Disposition: A | Discharge status: Home / Self Care | Payer: Medicare Other | Source: Ambulatory Visit | Attending: Interventional Radiology | Admitting: Interventional Radiology

## 2015-11-13 ENCOUNTER — Encounter (HOSPITAL_COMMUNITY): Payer: Self-pay | Admitting: *Deleted

## 2015-11-13 DIAGNOSIS — M549 Dorsalgia, unspecified: Secondary | ICD-10-CM

## 2015-11-13 DIAGNOSIS — IMO0002 Reserved for concepts with insufficient information to code with codable children: Secondary | ICD-10-CM

## 2015-11-13 DIAGNOSIS — Z880 Allergy status to penicillin: Secondary | ICD-10-CM | POA: Diagnosis not present

## 2015-11-13 DIAGNOSIS — I509 Heart failure, unspecified: Secondary | ICD-10-CM | POA: Insufficient documentation

## 2015-11-13 DIAGNOSIS — M4856XA Collapsed vertebra, not elsewhere classified, lumbar region, initial encounter for fracture: Secondary | ICD-10-CM | POA: Diagnosis present

## 2015-11-13 DIAGNOSIS — E785 Hyperlipidemia, unspecified: Secondary | ICD-10-CM | POA: Diagnosis not present

## 2015-11-13 DIAGNOSIS — Z95810 Presence of automatic (implantable) cardiac defibrillator: Secondary | ICD-10-CM | POA: Diagnosis not present

## 2015-11-13 DIAGNOSIS — I2510 Atherosclerotic heart disease of native coronary artery without angina pectoris: Secondary | ICD-10-CM | POA: Insufficient documentation

## 2015-11-13 DIAGNOSIS — Y92009 Unspecified place in unspecified non-institutional (private) residence as the place of occurrence of the external cause: Secondary | ICD-10-CM | POA: Diagnosis not present

## 2015-11-13 DIAGNOSIS — Z86711 Personal history of pulmonary embolism: Secondary | ICD-10-CM | POA: Diagnosis not present

## 2015-11-13 DIAGNOSIS — F419 Anxiety disorder, unspecified: Secondary | ICD-10-CM | POA: Diagnosis not present

## 2015-11-13 DIAGNOSIS — Z7982 Long term (current) use of aspirin: Secondary | ICD-10-CM | POA: Insufficient documentation

## 2015-11-13 DIAGNOSIS — Z951 Presence of aortocoronary bypass graft: Secondary | ICD-10-CM | POA: Insufficient documentation

## 2015-11-13 DIAGNOSIS — Z87891 Personal history of nicotine dependence: Secondary | ICD-10-CM | POA: Diagnosis not present

## 2015-11-13 DIAGNOSIS — Z885 Allergy status to narcotic agent status: Secondary | ICD-10-CM | POA: Insufficient documentation

## 2015-11-13 DIAGNOSIS — S32019A Unspecified fracture of first lumbar vertebra, initial encounter for closed fracture: Secondary | ICD-10-CM | POA: Insufficient documentation

## 2015-11-13 DIAGNOSIS — J449 Chronic obstructive pulmonary disease, unspecified: Secondary | ICD-10-CM | POA: Diagnosis not present

## 2015-11-13 DIAGNOSIS — W1839XA Other fall on same level, initial encounter: Secondary | ICD-10-CM | POA: Diagnosis not present

## 2015-11-13 DIAGNOSIS — I110 Hypertensive heart disease with heart failure: Secondary | ICD-10-CM | POA: Diagnosis not present

## 2015-11-13 HISTORY — DX: Presence of automatic (implantable) cardiac defibrillator: Z95.810

## 2015-11-13 HISTORY — DX: Hyperlipidemia, unspecified: E78.5

## 2015-11-13 HISTORY — DX: Atherosclerotic heart disease of native coronary artery without angina pectoris: I25.10

## 2015-11-13 HISTORY — DX: Heart failure, unspecified: I50.9

## 2015-11-13 HISTORY — DX: Personal history of pulmonary embolism: Z86.711

## 2015-11-13 HISTORY — DX: Essential (primary) hypertension: I10

## 2015-11-13 HISTORY — DX: Anxiety disorder, unspecified: F41.9

## 2015-11-13 HISTORY — PX: IR GENERIC HISTORICAL: IMG1180011

## 2015-11-13 HISTORY — DX: Chronic obstructive pulmonary disease, unspecified: J44.9

## 2015-11-13 LAB — BASIC METABOLIC PANEL
Anion gap: 9 (ref 5–15)
BUN: 21 mg/dL — AB (ref 6–20)
CHLORIDE: 103 mmol/L (ref 101–111)
CO2: 27 mmol/L (ref 22–32)
Calcium: 9.6 mg/dL (ref 8.9–10.3)
Creatinine, Ser: 1.27 mg/dL — ABNORMAL HIGH (ref 0.44–1.00)
GFR calc Af Amer: 46 mL/min — ABNORMAL LOW (ref >60)
GFR calc non Af Amer: 39 mL/min — ABNORMAL LOW (ref >60)
GLUCOSE: 119 mg/dL — AB (ref 65–99)
POTASSIUM: 4 mmol/L (ref 3.5–5.1)
Sodium: 139 mmol/L (ref 135–145)

## 2015-11-13 LAB — CBC
HEMATOCRIT: 39.9 % (ref 36.0–46.0)
HEMOGLOBIN: 12.7 g/dL (ref 12.0–15.0)
MCH: 29.7 pg (ref 26.0–34.0)
MCHC: 31.8 g/dL (ref 30.0–36.0)
MCV: 93.2 fL (ref 78.0–100.0)
Platelets: 277 10*3/uL (ref 150–400)
RBC: 4.28 MIL/uL (ref 3.87–5.11)
RDW: 13.8 % (ref 11.5–15.5)
WBC: 6.8 10*3/uL (ref 4.0–10.5)

## 2015-11-13 LAB — PROTIME-INR
INR: 1
PROTHROMBIN TIME: 13.2 s (ref 11.4–15.2)

## 2015-11-13 LAB — APTT: aPTT: 27 seconds (ref 24–36)

## 2015-11-13 MED ORDER — FENTANYL CITRATE (PF) 100 MCG/2ML IJ SOLN
INTRAMUSCULAR | Status: AC | PRN
  Administered 2015-11-13 (×3): 25 ug via INTRAVENOUS

## 2015-11-13 MED ORDER — VANCOMYCIN HCL IN DEXTROSE 1-5 GM/200ML-% IV SOLN
INTRAVENOUS | Status: AC
  Filled 2015-11-13: qty 200

## 2015-11-13 MED ORDER — MIDAZOLAM HCL 2 MG/2ML IJ SOLN
INTRAMUSCULAR | Status: AC
  Filled 2015-11-13: qty 6

## 2015-11-13 MED ORDER — SODIUM CHLORIDE 0.9 % IV SOLN: INTRAVENOUS | Status: AC

## 2015-11-13 MED ORDER — TOBRAMYCIN SULFATE 1.2 G IJ SOLR
INTRAMUSCULAR | Status: AC
  Administered 2015-11-13: 0.1 g
  Filled 2015-11-13: qty 1.2

## 2015-11-13 MED ORDER — CEFAZOLIN SODIUM-DEXTROSE 2-4 GM/100ML-% IV SOLN: 2.0000 g | INTRAVENOUS | Status: DC

## 2015-11-13 MED ORDER — VANCOMYCIN HCL 500 MG IV SOLR
500.0000 mg | Freq: Once | INTRAVENOUS | Status: AC
  Administered 2015-11-13: 500 mg via INTRAVENOUS

## 2015-11-13 MED ORDER — IOPAMIDOL (ISOVUE-300) INJECTION 61%
INTRAVENOUS | Status: AC
  Administered 2015-11-13: 5 mL
  Filled 2015-11-13: qty 50

## 2015-11-13 MED ORDER — HYDROMORPHONE HCL 1 MG/ML IJ SOLN
INTRAMUSCULAR | Status: AC
  Filled 2015-11-13: qty 1

## 2015-11-13 MED ORDER — MIDAZOLAM HCL 2 MG/2ML IJ SOLN
INTRAMUSCULAR | Status: AC | PRN
  Administered 2015-11-13: 0.5 mg via INTRAVENOUS
  Administered 2015-11-13 (×2): 1 mg via INTRAVENOUS

## 2015-11-13 MED ORDER — SODIUM CHLORIDE 0.9 % IV SOLN
INTRAVENOUS | Status: DC
  Administered 2015-11-13: 11:00:00 via INTRAVENOUS

## 2015-11-13 MED ORDER — BUPIVACAINE HCL (PF) 0.5 % IJ SOLN
INTRAMUSCULAR | Status: AC | PRN
  Administered 2015-11-13: 15 mL

## 2015-11-13 MED ORDER — BUPIVACAINE HCL (PF) 0.25 % IJ SOLN
INTRAMUSCULAR | Status: AC
  Filled 2015-11-13: qty 30

## 2015-11-13 MED ORDER — FENTANYL CITRATE (PF) 100 MCG/2ML IJ SOLN
INTRAMUSCULAR | Status: AC
  Filled 2015-11-13: qty 4

## 2015-11-13 NOTE — Discharge Instructions (Signed)
KYPHOPLASTY/VERTEBROPLASTY DISCHARGE INSTRUCTIONS  Medications: (check all that apply)     Resume all home medications as before procedure.                      Continue your pain medications as prescribed as needed.  Over the next 3-5 days, decrease your pain medication as tolerated.  Over the counter medications (i.e. Tylenol, ibuprofen, and aleve) may be substituted once severe/moderate pain symptoms have subsided.   Wound Care: - Bandages may be removed the day following your procedure.  You may get your incision wet once bandages are removed.  Bandaids may be used to cover the incisions until scab formation.  Topical ointments are optional.  - If you develop a fever greater than 101 degrees, have increased skin redness at the incision sites or pus-like oozing from incisions occurring within 1 week of the procedure, contact radiology at [phone] or [phone].  - Ice pack to back for 15-20 minutes 2-3 time per day for first 2-3 days post procedure.  The ice will expedite muscle healing and help with the pain from the incisions.   Activity: - Bedrest today with limited activity for 24 hours post procedure.  - No driving for 48 hours.  - Increase your activity as tolerated after bedrest (with assistance if necessary).  - Refrain from any strenuous activity or heavy lifting (greater than 10 lbs.).   Follow up: - Contact radiology at [phone] or [phone] if any questions/concerns.  - A physician assistant from radiology will contact you in approximately 1 week.  - If a biopsy was performed at the time of your procedure, your referring physician should receive the results in usually 2-3 days.        1.No stooping,bending or lifting more than 10 lbs for 2 weeks. 2.Use walker to ambulate for 2 weeks. 3.RTC in 2 weeks

## 2015-11-13 NOTE — Sedation Documentation (Signed)
Patient is resting comfortably. 

## 2015-11-13 NOTE — H&P (Signed)
Chief Complaint: Patient was seen in consultation today for Lumbar 1 vertebroplasty/kyphoplasty at the request of Cyndi Bender [facility]  Referring Physician(s): Cyndi Bender [facility]  Supervising Physician: Luanne Bras  Patient Status: Outpatient  History of Present Illness: Brandi Gardner is a 81 y.o. female   Pt has had falls at home "feet came out from under her" and fell backwards end of June Pain worsened for few weeks- used hydrocodone and oxycodone---but caused rash Changed to Tramadol- without issue Pain has subsided very little; continues to keep her form daily activities [facility] from [facility] was reviewed by Dr Abelina Bachelor acute fracture at Lumbar 1 Now scheduled for Lumbar 1 VP/KP   Past Medical History:  Diagnosis Date  . AICD (automatic cardioverter/defibrillator) present   . Anxiety   . CHF (congestive heart failure) ([place])   . COPD (chronic obstructive pulmonary disease) ([place])   . Coronary artery disease   . Hx of pulmonary embolus 1980  . Hyperlipidemia   . Hypertension     Past Surgical History:  Procedure Laterality Date  . CARDIAC CATHETERIZATION    . CORONARY ARTERY BYPASS GRAFT    . TUBAL LIGATION      Allergies: Benadryl [diphenhydramine]; Penicillins; Potassium-containing compounds; Percocet [oxycodone-acetaminophen]; and Vicodin [hydrocodone-acetaminophen]  Medications: Prior to Admission medications   Medication Sig Start Date End Date Taking? Authorizing Provider  acetaminophen (TYLENOL) 500 MG tablet Take 500 mg by mouth every 6 (six) hours as needed for moderate pain or headache.   Yes Historical Provider, MD  albuterol (PROVENTIL HFA;VENTOLIN HFA) 108 (90 Base) MCG/ACT inhaler Inhale 2 puffs into the lungs every 6 (six) hours as needed for wheezing or shortness of breath.   Yes Historical Provider, MD  albuterol (PROVENTIL) (2.5 MG/3ML) 0.083% nebulizer solution Take 2.5 mg by nebulization every 6 (six) hours as needed for  wheezing or shortness of breath.   Yes Historical Provider, MD  ALPRAZolam Duanne Moron) 0.5 MG tablet Take 0.5 mg by mouth 2 (two) times daily.   Yes Historical Provider, MD  aspirin EC 81 MG tablet Take 81 mg by mouth daily.   Yes Historical Provider, MD  carvedilol (COREG) 3.125 MG tablet Take 3.125 mg by mouth 2 (two) times daily with a meal.   Yes Historical Provider, MD  fluticasone (FLONASE) 50 MCG/ACT nasal spray Place 2 sprays into both nostrils daily.   Yes Historical Provider, MD  fluticasone (FLOVENT HFA) 110 MCG/ACT inhaler Inhale 2 puffs into the lungs daily.   Yes Historical Provider, MD  furosemide (LASIX) 40 MG tablet Take 40 mg by mouth 2 (two) times daily.   Yes Historical Provider, MD  lisinopril (PRINIVIL,ZESTRIL) 2.5 MG tablet Take 2.5 mg by mouth daily.   Yes Historical Provider, MD  nitroGLYCERIN (NITROSTAT) 0.4 MG SL tablet Place 0.4 mg under the tongue every 5 (five) minutes as needed for chest pain.   Yes Historical Provider, MD  polyethylene glycol (MIRALAX / GLYCOLAX) packet Take 17 g by mouth daily as needed for moderate constipation.   Yes Historical Provider, MD  spironolactone (ALDACTONE) 25 MG tablet Take 25 mg by mouth daily.   Yes Historical Provider, MD  tiotropium (SPIRIVA) 18 MCG inhalation capsule Place 18 mcg into inhaler and inhale daily.   Yes Historical Provider, MD  traMADol (ULTRAM) 50 MG tablet Take 25-50 mg by mouth every 6 (six) hours as needed for moderate pain.   Yes Historical Provider, MD     History reviewed. No pertinent family history.  Social  History   Social History  . Marital status: Widowed    Spouse name: N/A  . Number of children: N/A  . Years of education: N/A   Social History Main Topics  . Smoking status: Former Smoker    Quit date: 11/12/1992  . Smokeless tobacco: None  . Alcohol use None  . Drug use: Unknown  . Sexual activity: Not Asked   Other Topics Concern  . None   Social History Narrative  . None     Review of  Systems: A 12 point ROS discussed and pertinent positives are indicated in the HPI above.  All other systems are negative.  Review of Systems  Constitutional: Positive for activity change. Negative for appetite change, fatigue and fever.  Respiratory: Positive for shortness of breath and wheezing. Negative for cough.   Gastrointestinal: Negative for abdominal pain.  Musculoskeletal: Positive for back pain and gait problem.  Neurological: Positive for weakness.  Psychiatric/Behavioral: Negative for behavioral problems and confusion.    Vital Signs: BP (!) 147/86   Pulse 84   Resp 18   Ht '5\' 4"'$  (1.626 m)   Wt 123 lb (55.8 kg)   SpO2 99%   BMI 21.11 kg/m   Physical Exam  Constitutional: She is oriented to person, place, and time.  Cardiovascular: Normal rate and regular rhythm.   Left side defibrillator  Pulmonary/Chest: Effort normal.  Abdominal: Soft. Bowel sounds are normal.  Musculoskeletal: Normal range of motion.  Low back pain  Neurological: She is alert and oriented to person, place, and time.  Skin: Skin is warm and dry.  Psychiatric: She has a normal mood and affect. Her behavior is normal. Judgment and thought content normal.  Nursing note and vitals reviewed.   Mallampati Score:  MD Evaluation Airway: WNL Heart: WNL Abdomen: WNL Chest/ Lungs: WNL ASA  Classification: 3 Mallampati/Airway Score: Two  Imaging: No results found.  Labs:  CBC:  Recent Labs  11/13/15 1015  WBC 6.8  HGB 12.7  HCT 39.9  PLT 277    COAGS:  Recent Labs  11/13/15 1015  INR 1.00  APTT 27    BMP:  Recent Labs  11/13/15 1015  NA 139  K 4.0  CL 103  CO2 27  GLUCOSE 119*  BUN 21*  CALCIUM 9.6  CREATININE 1.27*  GFRNONAA 39*  GFRAA 46*    LIVER FUNCTION TESTS: No results for input(s): BILITOT, AST, ALT, ALKPHOS, PROT, ALBUMIN in the last 8760 hours.  TUMOR MARKERS: No results for input(s): AFPTM, CEA, CA199, CHROMGRNA in the last 8760  hours.  Assessment and Plan:  Low back pain after fall June 2017 Only minimally controlled with medication Still painful and limits activities Scheduled now for Lumbar 1 vertebroplasty/kyphoplasty Risks and Benefits discussed with the patient including, but not limited to education regarding the natural healing process of compression fractures without intervention, bleeding, infection, cement migration which may cause spinal cord damage, paralysis, pulmonary embolism or even death. All of the patient's questions were answered, patient is agreeable to proceed. Consent signed and in chart.   Thank you for this interesting consult.  I greatly enjoyed meeting Brandi Hernandes and look forward to participating in their care.  A copy of this report was sent to the requesting provider on this date.  Electronically Signed: Ayala Ribble A 11/13/2015, 11:11 AM   I spent a total of  30 Minutes   in face to face in clinical consultation, greater than 50% of which was counseling/coordinating care for  L1 VP/KP

## 2015-11-13 NOTE — Procedures (Addendum)
S/P L1 VP 

## 2015-11-13 NOTE — Sedation Documentation (Addendum)
Dsg to mid back dry, intact

## 2015-11-14 ENCOUNTER — Encounter (HOSPITAL_COMMUNITY): Payer: Self-pay | Admitting: Interventional Radiology

## 2015-11-15 ENCOUNTER — Telehealth (HOSPITAL_COMMUNITY): Payer: Self-pay

## 2015-11-15 NOTE — Telephone Encounter (Signed)
Called to schedule f/u, left message for pt to return call. AW 

## 2015-11-16 ENCOUNTER — Encounter (HOSPITAL_COMMUNITY): Payer: Self-pay | Admitting: Interventional Radiology

## 2015-11-22 ENCOUNTER — Other Ambulatory Visit (HOSPITAL_COMMUNITY): Payer: Self-pay | Admitting: Interventional Radiology

## 2015-11-22 DIAGNOSIS — IMO0002 Reserved for concepts with insufficient information to code with codable children: Secondary | ICD-10-CM

## 2015-12-07 ENCOUNTER — Ambulatory Visit (HOSPITAL_COMMUNITY)
Admission: RE | Admit: 2015-12-07 | Discharge: 2015-12-07 | Disposition: A | Discharge status: Home / Self Care | Payer: Medicare Other | Source: Ambulatory Visit | Attending: Interventional Radiology | Admitting: Interventional Radiology

## 2015-12-07 DIAGNOSIS — M25551 Pain in right hip: Secondary | ICD-10-CM | POA: Diagnosis not present

## 2015-12-07 DIAGNOSIS — IMO0002 Reserved for concepts with insufficient information to code with codable children: Secondary | ICD-10-CM

## 2015-12-07 HISTORY — PX: IR GENERIC HISTORICAL: IMG1180011

## 2015-12-11 ENCOUNTER — Encounter (HOSPITAL_COMMUNITY): Payer: Self-pay | Admitting: Interventional Radiology

## 2015-12-12 ENCOUNTER — Other Ambulatory Visit (HOSPITAL_COMMUNITY): Payer: Self-pay | Admitting: Interventional Radiology

## 2015-12-12 DIAGNOSIS — Z8781 Personal history of (healed) traumatic fracture: Secondary | ICD-10-CM

## 2015-12-12 DIAGNOSIS — M545 Low back pain, unspecified: Secondary | ICD-10-CM

## 2015-12-13 ENCOUNTER — Telehealth (HOSPITAL_COMMUNITY): Payer: Self-pay

## 2015-12-13 NOTE — Telephone Encounter (Signed)
Called to schedule bone scan, left message for pt to return call. AW

## 2015-12-25 ENCOUNTER — Other Ambulatory Visit (HOSPITAL_COMMUNITY): Payer: Self-pay | Admitting: Interventional Radiology

## 2016-01-07 DIAGNOSIS — H353211 Exudative age-related macular degeneration, right eye, with active choroidal neovascularization: Secondary | ICD-10-CM | POA: Diagnosis not present

## 2016-01-22 ENCOUNTER — Telehealth (HOSPITAL_COMMUNITY): Payer: Self-pay

## 2016-01-22 NOTE — Telephone Encounter (Signed)
Gregary Signs from [facility] called to inform Dr. Estanislado Pandy that pt did not show up for her bone scan on 01/21/16. AW

## 2016-02-07 DIAGNOSIS — Z95810 Presence of automatic (implantable) cardiac defibrillator: Secondary | ICD-10-CM | POA: Diagnosis not present

## 2016-02-07 DIAGNOSIS — H353231 Exudative age-related macular degeneration, bilateral, with active choroidal neovascularization: Secondary | ICD-10-CM | POA: Diagnosis not present

## 2016-02-13 DIAGNOSIS — I509 Heart failure, unspecified: Secondary | ICD-10-CM | POA: Diagnosis not present

## 2016-02-13 DIAGNOSIS — E785 Hyperlipidemia, unspecified: Secondary | ICD-10-CM | POA: Diagnosis not present

## 2016-02-13 DIAGNOSIS — Z79899 Other long term (current) drug therapy: Secondary | ICD-10-CM | POA: Diagnosis not present

## 2016-02-13 DIAGNOSIS — J441 Chronic obstructive pulmonary disease with (acute) exacerbation: Secondary | ICD-10-CM | POA: Diagnosis not present

## 2016-02-13 DIAGNOSIS — Z6823 Body mass index (BMI) 23.0-23.9, adult: Secondary | ICD-10-CM | POA: Diagnosis not present

## 2016-02-13 DIAGNOSIS — I10 Essential (primary) hypertension: Secondary | ICD-10-CM | POA: Diagnosis not present

## 2016-02-13 DIAGNOSIS — H60509 Unspecified acute noninfective otitis externa, unspecified ear: Secondary | ICD-10-CM | POA: Diagnosis not present

## 2016-02-13 DIAGNOSIS — F419 Anxiety disorder, unspecified: Secondary | ICD-10-CM | POA: Diagnosis not present

## 2016-02-13 DIAGNOSIS — I2510 Atherosclerotic heart disease of native coronary artery without angina pectoris: Secondary | ICD-10-CM | POA: Diagnosis not present

## 2016-03-21 DIAGNOSIS — H353231 Exudative age-related macular degeneration, bilateral, with active choroidal neovascularization: Secondary | ICD-10-CM | POA: Diagnosis not present

## 2016-03-28 DIAGNOSIS — J441 Chronic obstructive pulmonary disease with (acute) exacerbation: Secondary | ICD-10-CM | POA: Diagnosis not present

## 2016-03-28 DIAGNOSIS — R0789 Other chest pain: Secondary | ICD-10-CM | POA: Diagnosis not present

## 2016-05-08 DIAGNOSIS — H353231 Exudative age-related macular degeneration, bilateral, with active choroidal neovascularization: Secondary | ICD-10-CM | POA: Diagnosis not present

## 2016-06-04 DIAGNOSIS — F419 Anxiety disorder, unspecified: Secondary | ICD-10-CM | POA: Diagnosis not present

## 2016-06-04 DIAGNOSIS — I509 Heart failure, unspecified: Secondary | ICD-10-CM | POA: Diagnosis not present

## 2016-06-04 DIAGNOSIS — Z6822 Body mass index (BMI) 22.0-22.9, adult: Secondary | ICD-10-CM | POA: Diagnosis not present

## 2016-06-04 DIAGNOSIS — J019 Acute sinusitis, unspecified: Secondary | ICD-10-CM | POA: Diagnosis not present

## 2016-06-04 DIAGNOSIS — J441 Chronic obstructive pulmonary disease with (acute) exacerbation: Secondary | ICD-10-CM | POA: Diagnosis not present

## 2016-07-24 DIAGNOSIS — R05 Cough: Secondary | ICD-10-CM | POA: Diagnosis not present

## 2016-07-24 DIAGNOSIS — R0602 Shortness of breath: Secondary | ICD-10-CM | POA: Diagnosis not present

## 2016-07-24 DIAGNOSIS — J441 Chronic obstructive pulmonary disease with (acute) exacerbation: Secondary | ICD-10-CM | POA: Diagnosis not present

## 2016-07-24 DIAGNOSIS — E875 Hyperkalemia: Secondary | ICD-10-CM | POA: Diagnosis not present

## 2016-07-30 DIAGNOSIS — Z95810 Presence of automatic (implantable) cardiac defibrillator: Secondary | ICD-10-CM | POA: Diagnosis not present

## 2016-07-30 DIAGNOSIS — I5023 Acute on chronic systolic (congestive) heart failure: Secondary | ICD-10-CM | POA: Diagnosis not present

## 2016-07-31 DIAGNOSIS — I509 Heart failure, unspecified: Secondary | ICD-10-CM | POA: Diagnosis not present

## 2016-07-31 DIAGNOSIS — Z95810 Presence of automatic (implantable) cardiac defibrillator: Secondary | ICD-10-CM | POA: Diagnosis not present

## 2016-07-31 DIAGNOSIS — F419 Anxiety disorder, unspecified: Secondary | ICD-10-CM | POA: Diagnosis not present

## 2016-07-31 DIAGNOSIS — Z1389 Encounter for screening for other disorder: Secondary | ICD-10-CM | POA: Diagnosis not present

## 2016-07-31 DIAGNOSIS — E875 Hyperkalemia: Secondary | ICD-10-CM | POA: Diagnosis not present

## 2016-07-31 DIAGNOSIS — J441 Chronic obstructive pulmonary disease with (acute) exacerbation: Secondary | ICD-10-CM | POA: Diagnosis not present

## 2016-07-31 DIAGNOSIS — Z9181 History of falling: Secondary | ICD-10-CM | POA: Diagnosis not present

## 2016-07-31 DIAGNOSIS — Z79899 Other long term (current) drug therapy: Secondary | ICD-10-CM | POA: Diagnosis not present

## 2016-07-31 DIAGNOSIS — Z6823 Body mass index (BMI) 23.0-23.9, adult: Secondary | ICD-10-CM | POA: Diagnosis not present

## 2016-08-05 DIAGNOSIS — I252 Old myocardial infarction: Secondary | ICD-10-CM | POA: Diagnosis not present

## 2016-08-05 DIAGNOSIS — Z7982 Long term (current) use of aspirin: Secondary | ICD-10-CM | POA: Diagnosis not present

## 2016-08-05 DIAGNOSIS — J9600 Acute respiratory failure, unspecified whether with hypoxia or hypercapnia: Secondary | ICD-10-CM | POA: Diagnosis not present

## 2016-08-05 DIAGNOSIS — I429 Cardiomyopathy, unspecified: Secondary | ICD-10-CM | POA: Diagnosis not present

## 2016-08-05 DIAGNOSIS — M1990 Unspecified osteoarthritis, unspecified site: Secondary | ICD-10-CM | POA: Diagnosis present

## 2016-08-05 DIAGNOSIS — Z86711 Personal history of pulmonary embolism: Secondary | ICD-10-CM | POA: Diagnosis not present

## 2016-08-05 DIAGNOSIS — Z95810 Presence of automatic (implantable) cardiac defibrillator: Secondary | ICD-10-CM | POA: Diagnosis not present

## 2016-08-05 DIAGNOSIS — R069 Unspecified abnormalities of breathing: Secondary | ICD-10-CM | POA: Diagnosis not present

## 2016-08-05 DIAGNOSIS — J9601 Acute respiratory failure with hypoxia: Secondary | ICD-10-CM | POA: Diagnosis not present

## 2016-08-05 DIAGNOSIS — I509 Heart failure, unspecified: Secondary | ICD-10-CM | POA: Diagnosis not present

## 2016-08-05 DIAGNOSIS — I2510 Atherosclerotic heart disease of native coronary artery without angina pectoris: Secondary | ICD-10-CM | POA: Diagnosis not present

## 2016-08-05 DIAGNOSIS — J441 Chronic obstructive pulmonary disease with (acute) exacerbation: Secondary | ICD-10-CM | POA: Diagnosis not present

## 2016-08-05 DIAGNOSIS — F419 Anxiety disorder, unspecified: Secondary | ICD-10-CM | POA: Diagnosis present

## 2016-08-05 DIAGNOSIS — E872 Acidosis: Secondary | ICD-10-CM | POA: Diagnosis not present

## 2016-08-05 DIAGNOSIS — Z888 Allergy status to other drugs, medicaments and biological substances status: Secondary | ICD-10-CM | POA: Diagnosis not present

## 2016-08-05 DIAGNOSIS — Z885 Allergy status to narcotic agent status: Secondary | ICD-10-CM | POA: Diagnosis not present

## 2016-08-05 DIAGNOSIS — K219 Gastro-esophageal reflux disease without esophagitis: Secondary | ICD-10-CM | POA: Diagnosis present

## 2016-08-05 DIAGNOSIS — E7800 Pure hypercholesterolemia, unspecified: Secondary | ICD-10-CM | POA: Diagnosis present

## 2016-08-05 DIAGNOSIS — I255 Ischemic cardiomyopathy: Secondary | ICD-10-CM | POA: Diagnosis present

## 2016-08-05 DIAGNOSIS — Z79899 Other long term (current) drug therapy: Secondary | ICD-10-CM | POA: Diagnosis not present

## 2016-08-05 DIAGNOSIS — I5022 Chronic systolic (congestive) heart failure: Secondary | ICD-10-CM | POA: Diagnosis not present

## 2016-08-05 DIAGNOSIS — I110 Hypertensive heart disease with heart failure: Secondary | ICD-10-CM | POA: Diagnosis not present

## 2016-08-05 DIAGNOSIS — Z880 Allergy status to penicillin: Secondary | ICD-10-CM | POA: Diagnosis not present

## 2016-08-05 DIAGNOSIS — I25810 Atherosclerosis of coronary artery bypass graft(s) without angina pectoris: Secondary | ICD-10-CM | POA: Diagnosis not present

## 2016-08-05 DIAGNOSIS — R0602 Shortness of breath: Secondary | ICD-10-CM | POA: Diagnosis not present

## 2016-08-05 DIAGNOSIS — Z951 Presence of aortocoronary bypass graft: Secondary | ICD-10-CM | POA: Diagnosis not present

## 2016-08-06 DIAGNOSIS — J9600 Acute respiratory failure, unspecified whether with hypoxia or hypercapnia: Secondary | ICD-10-CM

## 2016-08-07 DIAGNOSIS — H353232 Exudative age-related macular degeneration, bilateral, with inactive choroidal neovascularization: Secondary | ICD-10-CM | POA: Diagnosis not present

## 2016-08-13 DIAGNOSIS — I509 Heart failure, unspecified: Secondary | ICD-10-CM | POA: Diagnosis not present

## 2016-08-13 DIAGNOSIS — J441 Chronic obstructive pulmonary disease with (acute) exacerbation: Secondary | ICD-10-CM | POA: Diagnosis not present

## 2016-08-13 DIAGNOSIS — J439 Emphysema, unspecified: Secondary | ICD-10-CM | POA: Diagnosis not present

## 2016-08-13 DIAGNOSIS — E875 Hyperkalemia: Secondary | ICD-10-CM | POA: Diagnosis not present

## 2016-08-13 DIAGNOSIS — Z6823 Body mass index (BMI) 23.0-23.9, adult: Secondary | ICD-10-CM | POA: Diagnosis not present

## 2016-09-03 DIAGNOSIS — H5203 Hypermetropia, bilateral: Secondary | ICD-10-CM | POA: Diagnosis not present

## 2016-09-04 DIAGNOSIS — H353211 Exudative age-related macular degeneration, right eye, with active choroidal neovascularization: Secondary | ICD-10-CM | POA: Diagnosis not present

## 2016-09-30 ENCOUNTER — Encounter: Payer: Self-pay | Admitting: Cardiology

## 2016-09-30 DIAGNOSIS — J9621 Acute and chronic respiratory failure with hypoxia: Secondary | ICD-10-CM | POA: Diagnosis not present

## 2016-09-30 DIAGNOSIS — I480 Paroxysmal atrial fibrillation: Secondary | ICD-10-CM | POA: Diagnosis not present

## 2016-09-30 DIAGNOSIS — R0603 Acute respiratory distress: Secondary | ICD-10-CM | POA: Diagnosis not present

## 2016-09-30 DIAGNOSIS — R05 Cough: Secondary | ICD-10-CM | POA: Diagnosis not present

## 2016-09-30 DIAGNOSIS — R6520 Severe sepsis without septic shock: Secondary | ICD-10-CM | POA: Diagnosis not present

## 2016-09-30 DIAGNOSIS — Z881 Allergy status to other antibiotic agents status: Secondary | ICD-10-CM | POA: Diagnosis not present

## 2016-09-30 DIAGNOSIS — I252 Old myocardial infarction: Secondary | ICD-10-CM | POA: Diagnosis not present

## 2016-09-30 DIAGNOSIS — J9601 Acute respiratory failure with hypoxia: Secondary | ICD-10-CM | POA: Diagnosis not present

## 2016-09-30 DIAGNOSIS — I130 Hypertensive heart and chronic kidney disease with heart failure and stage 1 through stage 4 chronic kidney disease, or unspecified chronic kidney disease: Secondary | ICD-10-CM | POA: Diagnosis not present

## 2016-09-30 DIAGNOSIS — R0602 Shortness of breath: Secondary | ICD-10-CM | POA: Diagnosis not present

## 2016-09-30 DIAGNOSIS — Z87891 Personal history of nicotine dependence: Secondary | ICD-10-CM | POA: Diagnosis not present

## 2016-09-30 DIAGNOSIS — N183 Chronic kidney disease, stage 3 (moderate): Secondary | ICD-10-CM | POA: Diagnosis present

## 2016-09-30 DIAGNOSIS — R918 Other nonspecific abnormal finding of lung field: Secondary | ICD-10-CM | POA: Diagnosis present

## 2016-09-30 DIAGNOSIS — E1165 Type 2 diabetes mellitus with hyperglycemia: Secondary | ICD-10-CM | POA: Diagnosis not present

## 2016-09-30 DIAGNOSIS — I5023 Acute on chronic systolic (congestive) heart failure: Secondary | ICD-10-CM | POA: Diagnosis not present

## 2016-09-30 DIAGNOSIS — Z951 Presence of aortocoronary bypass graft: Secondary | ICD-10-CM | POA: Diagnosis not present

## 2016-09-30 DIAGNOSIS — I509 Heart failure, unspecified: Secondary | ICD-10-CM | POA: Diagnosis not present

## 2016-09-30 DIAGNOSIS — Z95810 Presence of automatic (implantable) cardiac defibrillator: Secondary | ICD-10-CM | POA: Diagnosis not present

## 2016-09-30 DIAGNOSIS — Z888 Allergy status to other drugs, medicaments and biological substances status: Secondary | ICD-10-CM | POA: Diagnosis not present

## 2016-09-30 DIAGNOSIS — Z7982 Long term (current) use of aspirin: Secondary | ICD-10-CM | POA: Diagnosis not present

## 2016-09-30 DIAGNOSIS — R000 Tachycardia, unspecified: Secondary | ICD-10-CM | POA: Diagnosis not present

## 2016-09-30 DIAGNOSIS — E1122 Type 2 diabetes mellitus with diabetic chronic kidney disease: Secondary | ICD-10-CM | POA: Diagnosis present

## 2016-09-30 DIAGNOSIS — Z8701 Personal history of pneumonia (recurrent): Secondary | ICD-10-CM | POA: Diagnosis not present

## 2016-09-30 DIAGNOSIS — M1990 Unspecified osteoarthritis, unspecified site: Secondary | ICD-10-CM | POA: Diagnosis present

## 2016-09-30 DIAGNOSIS — Z880 Allergy status to penicillin: Secondary | ICD-10-CM | POA: Diagnosis not present

## 2016-09-30 DIAGNOSIS — F419 Anxiety disorder, unspecified: Secondary | ICD-10-CM | POA: Diagnosis not present

## 2016-09-30 DIAGNOSIS — A419 Sepsis, unspecified organism: Secondary | ICD-10-CM | POA: Diagnosis not present

## 2016-09-30 DIAGNOSIS — Z79899 Other long term (current) drug therapy: Secondary | ICD-10-CM | POA: Diagnosis not present

## 2016-09-30 DIAGNOSIS — J441 Chronic obstructive pulmonary disease with (acute) exacerbation: Secondary | ICD-10-CM | POA: Diagnosis present

## 2016-09-30 DIAGNOSIS — R069 Unspecified abnormalities of breathing: Secondary | ICD-10-CM | POA: Diagnosis not present

## 2016-09-30 DIAGNOSIS — Z86711 Personal history of pulmonary embolism: Secondary | ICD-10-CM | POA: Diagnosis not present

## 2016-10-05 DIAGNOSIS — I130 Hypertensive heart and chronic kidney disease with heart failure and stage 1 through stage 4 chronic kidney disease, or unspecified chronic kidney disease: Secondary | ICD-10-CM | POA: Diagnosis not present

## 2016-10-05 DIAGNOSIS — I255 Ischemic cardiomyopathy: Secondary | ICD-10-CM | POA: Diagnosis not present

## 2016-10-05 DIAGNOSIS — Z7951 Long term (current) use of inhaled steroids: Secondary | ICD-10-CM | POA: Diagnosis not present

## 2016-10-05 DIAGNOSIS — J9611 Chronic respiratory failure with hypoxia: Secondary | ICD-10-CM | POA: Diagnosis not present

## 2016-10-05 DIAGNOSIS — N183 Chronic kidney disease, stage 3 (moderate): Secondary | ICD-10-CM | POA: Diagnosis not present

## 2016-10-05 DIAGNOSIS — Z7982 Long term (current) use of aspirin: Secondary | ICD-10-CM | POA: Diagnosis not present

## 2016-10-05 DIAGNOSIS — I5023 Acute on chronic systolic (congestive) heart failure: Secondary | ICD-10-CM | POA: Diagnosis not present

## 2016-10-05 DIAGNOSIS — Z87891 Personal history of nicotine dependence: Secondary | ICD-10-CM | POA: Diagnosis not present

## 2016-10-05 DIAGNOSIS — Z95810 Presence of automatic (implantable) cardiac defibrillator: Secondary | ICD-10-CM | POA: Diagnosis not present

## 2016-10-05 DIAGNOSIS — F419 Anxiety disorder, unspecified: Secondary | ICD-10-CM | POA: Diagnosis not present

## 2016-10-05 DIAGNOSIS — I2510 Atherosclerotic heart disease of native coronary artery without angina pectoris: Secondary | ICD-10-CM | POA: Diagnosis not present

## 2016-10-05 DIAGNOSIS — E1122 Type 2 diabetes mellitus with diabetic chronic kidney disease: Secondary | ICD-10-CM | POA: Diagnosis not present

## 2016-10-05 DIAGNOSIS — Z9181 History of falling: Secondary | ICD-10-CM | POA: Diagnosis not present

## 2016-10-05 DIAGNOSIS — Z7952 Long term (current) use of systemic steroids: Secondary | ICD-10-CM | POA: Diagnosis not present

## 2016-10-05 DIAGNOSIS — J439 Emphysema, unspecified: Secondary | ICD-10-CM | POA: Diagnosis not present

## 2016-10-05 DIAGNOSIS — Z951 Presence of aortocoronary bypass graft: Secondary | ICD-10-CM | POA: Diagnosis not present

## 2016-10-07 DIAGNOSIS — J9611 Chronic respiratory failure with hypoxia: Secondary | ICD-10-CM | POA: Diagnosis not present

## 2016-10-07 DIAGNOSIS — E1122 Type 2 diabetes mellitus with diabetic chronic kidney disease: Secondary | ICD-10-CM | POA: Diagnosis not present

## 2016-10-07 DIAGNOSIS — I130 Hypertensive heart and chronic kidney disease with heart failure and stage 1 through stage 4 chronic kidney disease, or unspecified chronic kidney disease: Secondary | ICD-10-CM | POA: Diagnosis not present

## 2016-10-07 DIAGNOSIS — J439 Emphysema, unspecified: Secondary | ICD-10-CM | POA: Diagnosis not present

## 2016-10-07 DIAGNOSIS — N183 Chronic kidney disease, stage 3 (moderate): Secondary | ICD-10-CM | POA: Diagnosis not present

## 2016-10-07 DIAGNOSIS — I5023 Acute on chronic systolic (congestive) heart failure: Secondary | ICD-10-CM | POA: Diagnosis not present

## 2016-10-13 ENCOUNTER — Encounter: Payer: Self-pay | Admitting: Cardiology

## 2016-10-13 DIAGNOSIS — I340 Nonrheumatic mitral (valve) insufficiency: Secondary | ICD-10-CM | POA: Insufficient documentation

## 2016-10-14 ENCOUNTER — Ambulatory Visit: Payer: Medicare Other | Admitting: Cardiology

## 2016-10-14 DIAGNOSIS — I130 Hypertensive heart and chronic kidney disease with heart failure and stage 1 through stage 4 chronic kidney disease, or unspecified chronic kidney disease: Secondary | ICD-10-CM | POA: Diagnosis not present

## 2016-10-14 DIAGNOSIS — E1122 Type 2 diabetes mellitus with diabetic chronic kidney disease: Secondary | ICD-10-CM | POA: Diagnosis not present

## 2016-10-14 DIAGNOSIS — I5023 Acute on chronic systolic (congestive) heart failure: Secondary | ICD-10-CM | POA: Diagnosis not present

## 2016-10-14 DIAGNOSIS — N183 Chronic kidney disease, stage 3 (moderate): Secondary | ICD-10-CM | POA: Diagnosis not present

## 2016-10-14 DIAGNOSIS — J9611 Chronic respiratory failure with hypoxia: Secondary | ICD-10-CM | POA: Diagnosis not present

## 2016-10-14 DIAGNOSIS — J439 Emphysema, unspecified: Secondary | ICD-10-CM | POA: Diagnosis not present

## 2016-10-16 DIAGNOSIS — J9611 Chronic respiratory failure with hypoxia: Secondary | ICD-10-CM | POA: Diagnosis not present

## 2016-10-16 DIAGNOSIS — N183 Chronic kidney disease, stage 3 (moderate): Secondary | ICD-10-CM | POA: Diagnosis not present

## 2016-10-16 DIAGNOSIS — I130 Hypertensive heart and chronic kidney disease with heart failure and stage 1 through stage 4 chronic kidney disease, or unspecified chronic kidney disease: Secondary | ICD-10-CM | POA: Diagnosis not present

## 2016-10-16 DIAGNOSIS — I5023 Acute on chronic systolic (congestive) heart failure: Secondary | ICD-10-CM | POA: Diagnosis not present

## 2016-10-16 DIAGNOSIS — J439 Emphysema, unspecified: Secondary | ICD-10-CM | POA: Diagnosis not present

## 2016-10-16 DIAGNOSIS — E1122 Type 2 diabetes mellitus with diabetic chronic kidney disease: Secondary | ICD-10-CM | POA: Diagnosis not present

## 2016-10-21 DIAGNOSIS — J9611 Chronic respiratory failure with hypoxia: Secondary | ICD-10-CM | POA: Diagnosis not present

## 2016-10-21 DIAGNOSIS — N183 Chronic kidney disease, stage 3 (moderate): Secondary | ICD-10-CM | POA: Diagnosis not present

## 2016-10-21 DIAGNOSIS — J439 Emphysema, unspecified: Secondary | ICD-10-CM | POA: Diagnosis not present

## 2016-10-21 DIAGNOSIS — E1122 Type 2 diabetes mellitus with diabetic chronic kidney disease: Secondary | ICD-10-CM | POA: Diagnosis not present

## 2016-10-21 DIAGNOSIS — I5023 Acute on chronic systolic (congestive) heart failure: Secondary | ICD-10-CM | POA: Diagnosis not present

## 2016-10-21 DIAGNOSIS — I130 Hypertensive heart and chronic kidney disease with heart failure and stage 1 through stage 4 chronic kidney disease, or unspecified chronic kidney disease: Secondary | ICD-10-CM | POA: Diagnosis not present

## 2016-10-22 DIAGNOSIS — Z95810 Presence of automatic (implantable) cardiac defibrillator: Secondary | ICD-10-CM | POA: Diagnosis not present

## 2016-10-22 DIAGNOSIS — J441 Chronic obstructive pulmonary disease with (acute) exacerbation: Secondary | ICD-10-CM | POA: Diagnosis not present

## 2016-10-22 DIAGNOSIS — I255 Ischemic cardiomyopathy: Secondary | ICD-10-CM | POA: Diagnosis not present

## 2016-10-22 DIAGNOSIS — Z951 Presence of aortocoronary bypass graft: Secondary | ICD-10-CM | POA: Diagnosis not present

## 2016-10-22 DIAGNOSIS — E875 Hyperkalemia: Secondary | ICD-10-CM | POA: Insufficient documentation

## 2016-10-23 DIAGNOSIS — H353211 Exudative age-related macular degeneration, right eye, with active choroidal neovascularization: Secondary | ICD-10-CM | POA: Diagnosis not present

## 2016-10-29 DIAGNOSIS — Z95810 Presence of automatic (implantable) cardiac defibrillator: Secondary | ICD-10-CM | POA: Diagnosis not present

## 2016-10-30 DIAGNOSIS — Z6824 Body mass index (BMI) 24.0-24.9, adult: Secondary | ICD-10-CM | POA: Diagnosis not present

## 2016-10-30 DIAGNOSIS — I509 Heart failure, unspecified: Secondary | ICD-10-CM | POA: Diagnosis not present

## 2016-10-30 DIAGNOSIS — Z79899 Other long term (current) drug therapy: Secondary | ICD-10-CM | POA: Diagnosis not present

## 2016-10-30 DIAGNOSIS — J439 Emphysema, unspecified: Secondary | ICD-10-CM | POA: Diagnosis not present

## 2016-10-30 DIAGNOSIS — F419 Anxiety disorder, unspecified: Secondary | ICD-10-CM | POA: Diagnosis not present

## 2016-10-31 DIAGNOSIS — J439 Emphysema, unspecified: Secondary | ICD-10-CM | POA: Diagnosis not present

## 2016-10-31 DIAGNOSIS — E1122 Type 2 diabetes mellitus with diabetic chronic kidney disease: Secondary | ICD-10-CM | POA: Diagnosis not present

## 2016-10-31 DIAGNOSIS — J9611 Chronic respiratory failure with hypoxia: Secondary | ICD-10-CM | POA: Diagnosis not present

## 2016-10-31 DIAGNOSIS — I130 Hypertensive heart and chronic kidney disease with heart failure and stage 1 through stage 4 chronic kidney disease, or unspecified chronic kidney disease: Secondary | ICD-10-CM | POA: Diagnosis not present

## 2016-10-31 DIAGNOSIS — I5023 Acute on chronic systolic (congestive) heart failure: Secondary | ICD-10-CM | POA: Diagnosis not present

## 2016-10-31 DIAGNOSIS — N183 Chronic kidney disease, stage 3 (moderate): Secondary | ICD-10-CM | POA: Diagnosis not present

## 2016-11-06 DIAGNOSIS — I130 Hypertensive heart and chronic kidney disease with heart failure and stage 1 through stage 4 chronic kidney disease, or unspecified chronic kidney disease: Secondary | ICD-10-CM | POA: Diagnosis not present

## 2016-11-06 DIAGNOSIS — I5023 Acute on chronic systolic (congestive) heart failure: Secondary | ICD-10-CM | POA: Diagnosis not present

## 2016-11-06 DIAGNOSIS — E1122 Type 2 diabetes mellitus with diabetic chronic kidney disease: Secondary | ICD-10-CM | POA: Diagnosis not present

## 2016-11-06 DIAGNOSIS — N183 Chronic kidney disease, stage 3 (moderate): Secondary | ICD-10-CM | POA: Diagnosis not present

## 2016-11-06 DIAGNOSIS — J9611 Chronic respiratory failure with hypoxia: Secondary | ICD-10-CM | POA: Diagnosis not present

## 2016-11-06 DIAGNOSIS — J439 Emphysema, unspecified: Secondary | ICD-10-CM | POA: Diagnosis not present

## 2016-11-13 DIAGNOSIS — J439 Emphysema, unspecified: Secondary | ICD-10-CM | POA: Diagnosis not present

## 2016-11-13 DIAGNOSIS — I130 Hypertensive heart and chronic kidney disease with heart failure and stage 1 through stage 4 chronic kidney disease, or unspecified chronic kidney disease: Secondary | ICD-10-CM | POA: Diagnosis not present

## 2016-11-13 DIAGNOSIS — I5023 Acute on chronic systolic (congestive) heart failure: Secondary | ICD-10-CM | POA: Diagnosis not present

## 2016-11-13 DIAGNOSIS — E1122 Type 2 diabetes mellitus with diabetic chronic kidney disease: Secondary | ICD-10-CM | POA: Diagnosis not present

## 2016-11-13 DIAGNOSIS — J9611 Chronic respiratory failure with hypoxia: Secondary | ICD-10-CM | POA: Diagnosis not present

## 2016-11-13 DIAGNOSIS — N183 Chronic kidney disease, stage 3 (moderate): Secondary | ICD-10-CM | POA: Diagnosis not present

## 2016-11-20 DIAGNOSIS — Z8701 Personal history of pneumonia (recurrent): Secondary | ICD-10-CM | POA: Diagnosis not present

## 2016-11-20 DIAGNOSIS — Z951 Presence of aortocoronary bypass graft: Secondary | ICD-10-CM | POA: Diagnosis not present

## 2016-11-20 DIAGNOSIS — R0602 Shortness of breath: Secondary | ICD-10-CM | POA: Diagnosis not present

## 2016-11-20 DIAGNOSIS — J449 Chronic obstructive pulmonary disease, unspecified: Secondary | ICD-10-CM | POA: Diagnosis present

## 2016-11-20 DIAGNOSIS — I509 Heart failure, unspecified: Secondary | ICD-10-CM | POA: Diagnosis not present

## 2016-11-20 DIAGNOSIS — Z87891 Personal history of nicotine dependence: Secondary | ICD-10-CM | POA: Diagnosis not present

## 2016-11-20 DIAGNOSIS — R079 Chest pain, unspecified: Secondary | ICD-10-CM | POA: Diagnosis not present

## 2016-11-20 DIAGNOSIS — I110 Hypertensive heart disease with heart failure: Secondary | ICD-10-CM | POA: Diagnosis not present

## 2016-11-20 DIAGNOSIS — Z881 Allergy status to other antibiotic agents status: Secondary | ICD-10-CM | POA: Diagnosis not present

## 2016-11-20 DIAGNOSIS — M1990 Unspecified osteoarthritis, unspecified site: Secondary | ICD-10-CM | POA: Diagnosis present

## 2016-11-20 DIAGNOSIS — Z86711 Personal history of pulmonary embolism: Secondary | ICD-10-CM | POA: Diagnosis not present

## 2016-11-20 DIAGNOSIS — I252 Old myocardial infarction: Secondary | ICD-10-CM | POA: Diagnosis not present

## 2016-11-20 DIAGNOSIS — I5021 Acute systolic (congestive) heart failure: Secondary | ICD-10-CM | POA: Diagnosis not present

## 2016-11-20 DIAGNOSIS — J9601 Acute respiratory failure with hypoxia: Secondary | ICD-10-CM | POA: Diagnosis not present

## 2016-11-20 DIAGNOSIS — J9691 Respiratory failure, unspecified with hypoxia: Secondary | ICD-10-CM | POA: Diagnosis not present

## 2016-11-20 DIAGNOSIS — M79661 Pain in right lower leg: Secondary | ICD-10-CM | POA: Diagnosis not present

## 2016-11-20 DIAGNOSIS — Z888 Allergy status to other drugs, medicaments and biological substances status: Secondary | ICD-10-CM | POA: Diagnosis not present

## 2016-11-20 DIAGNOSIS — I429 Cardiomyopathy, unspecified: Secondary | ICD-10-CM | POA: Diagnosis present

## 2016-11-20 DIAGNOSIS — E7800 Pure hypercholesterolemia, unspecified: Secondary | ICD-10-CM | POA: Diagnosis present

## 2016-11-20 DIAGNOSIS — Z880 Allergy status to penicillin: Secondary | ICD-10-CM | POA: Diagnosis not present

## 2016-11-20 DIAGNOSIS — R061 Stridor: Secondary | ICD-10-CM | POA: Diagnosis not present

## 2016-11-20 DIAGNOSIS — I25810 Atherosclerosis of coronary artery bypass graft(s) without angina pectoris: Secondary | ICD-10-CM | POA: Diagnosis not present

## 2016-11-20 DIAGNOSIS — J9600 Acute respiratory failure, unspecified whether with hypoxia or hypercapnia: Secondary | ICD-10-CM | POA: Diagnosis not present

## 2016-11-20 DIAGNOSIS — K219 Gastro-esophageal reflux disease without esophagitis: Secondary | ICD-10-CM | POA: Diagnosis present

## 2016-11-20 DIAGNOSIS — M79662 Pain in left lower leg: Secondary | ICD-10-CM | POA: Diagnosis not present

## 2016-11-20 DIAGNOSIS — F419 Anxiety disorder, unspecified: Secondary | ICD-10-CM | POA: Diagnosis present

## 2016-11-20 DIAGNOSIS — Z7982 Long term (current) use of aspirin: Secondary | ICD-10-CM | POA: Diagnosis not present

## 2016-11-20 DIAGNOSIS — Z79899 Other long term (current) drug therapy: Secondary | ICD-10-CM | POA: Diagnosis not present

## 2016-11-21 DIAGNOSIS — R0602 Shortness of breath: Secondary | ICD-10-CM

## 2016-11-28 DIAGNOSIS — I5023 Acute on chronic systolic (congestive) heart failure: Secondary | ICD-10-CM | POA: Diagnosis not present

## 2016-11-28 DIAGNOSIS — E1122 Type 2 diabetes mellitus with diabetic chronic kidney disease: Secondary | ICD-10-CM | POA: Diagnosis not present

## 2016-11-28 DIAGNOSIS — I130 Hypertensive heart and chronic kidney disease with heart failure and stage 1 through stage 4 chronic kidney disease, or unspecified chronic kidney disease: Secondary | ICD-10-CM | POA: Diagnosis not present

## 2016-11-28 DIAGNOSIS — N183 Chronic kidney disease, stage 3 (moderate): Secondary | ICD-10-CM | POA: Diagnosis not present

## 2016-11-28 DIAGNOSIS — J9611 Chronic respiratory failure with hypoxia: Secondary | ICD-10-CM | POA: Diagnosis not present

## 2016-11-28 DIAGNOSIS — J439 Emphysema, unspecified: Secondary | ICD-10-CM | POA: Diagnosis not present

## 2016-12-03 DIAGNOSIS — J9611 Chronic respiratory failure with hypoxia: Secondary | ICD-10-CM | POA: Diagnosis not present

## 2016-12-03 DIAGNOSIS — N183 Chronic kidney disease, stage 3 (moderate): Secondary | ICD-10-CM | POA: Diagnosis not present

## 2016-12-03 DIAGNOSIS — I130 Hypertensive heart and chronic kidney disease with heart failure and stage 1 through stage 4 chronic kidney disease, or unspecified chronic kidney disease: Secondary | ICD-10-CM | POA: Diagnosis not present

## 2016-12-03 DIAGNOSIS — I5023 Acute on chronic systolic (congestive) heart failure: Secondary | ICD-10-CM | POA: Diagnosis not present

## 2016-12-03 DIAGNOSIS — J439 Emphysema, unspecified: Secondary | ICD-10-CM | POA: Diagnosis not present

## 2016-12-03 DIAGNOSIS — E1122 Type 2 diabetes mellitus with diabetic chronic kidney disease: Secondary | ICD-10-CM | POA: Diagnosis not present

## 2016-12-04 DIAGNOSIS — Z7952 Long term (current) use of systemic steroids: Secondary | ICD-10-CM | POA: Diagnosis not present

## 2016-12-04 DIAGNOSIS — Z951 Presence of aortocoronary bypass graft: Secondary | ICD-10-CM | POA: Diagnosis not present

## 2016-12-04 DIAGNOSIS — J9611 Chronic respiratory failure with hypoxia: Secondary | ICD-10-CM | POA: Diagnosis not present

## 2016-12-04 DIAGNOSIS — E1122 Type 2 diabetes mellitus with diabetic chronic kidney disease: Secondary | ICD-10-CM | POA: Diagnosis not present

## 2016-12-04 DIAGNOSIS — Z7982 Long term (current) use of aspirin: Secondary | ICD-10-CM | POA: Diagnosis not present

## 2016-12-04 DIAGNOSIS — N183 Chronic kidney disease, stage 3 (moderate): Secondary | ICD-10-CM | POA: Diagnosis not present

## 2016-12-04 DIAGNOSIS — Z9181 History of falling: Secondary | ICD-10-CM | POA: Diagnosis not present

## 2016-12-04 DIAGNOSIS — I130 Hypertensive heart and chronic kidney disease with heart failure and stage 1 through stage 4 chronic kidney disease, or unspecified chronic kidney disease: Secondary | ICD-10-CM | POA: Diagnosis not present

## 2016-12-04 DIAGNOSIS — Z7951 Long term (current) use of inhaled steroids: Secondary | ICD-10-CM | POA: Diagnosis not present

## 2016-12-04 DIAGNOSIS — I5023 Acute on chronic systolic (congestive) heart failure: Secondary | ICD-10-CM | POA: Diagnosis not present

## 2016-12-04 DIAGNOSIS — J439 Emphysema, unspecified: Secondary | ICD-10-CM | POA: Diagnosis not present

## 2016-12-04 DIAGNOSIS — Z87891 Personal history of nicotine dependence: Secondary | ICD-10-CM | POA: Diagnosis not present

## 2016-12-04 DIAGNOSIS — Z95810 Presence of automatic (implantable) cardiac defibrillator: Secondary | ICD-10-CM | POA: Diagnosis not present

## 2016-12-04 DIAGNOSIS — I255 Ischemic cardiomyopathy: Secondary | ICD-10-CM | POA: Diagnosis not present

## 2016-12-04 DIAGNOSIS — F419 Anxiety disorder, unspecified: Secondary | ICD-10-CM | POA: Diagnosis not present

## 2016-12-04 DIAGNOSIS — I2510 Atherosclerotic heart disease of native coronary artery without angina pectoris: Secondary | ICD-10-CM | POA: Diagnosis not present

## 2016-12-05 DIAGNOSIS — G4700 Insomnia, unspecified: Secondary | ICD-10-CM | POA: Diagnosis not present

## 2016-12-05 DIAGNOSIS — F419 Anxiety disorder, unspecified: Secondary | ICD-10-CM | POA: Diagnosis not present

## 2016-12-05 DIAGNOSIS — I509 Heart failure, unspecified: Secondary | ICD-10-CM | POA: Diagnosis not present

## 2016-12-05 DIAGNOSIS — R7303 Prediabetes: Secondary | ICD-10-CM | POA: Diagnosis not present

## 2016-12-05 DIAGNOSIS — J439 Emphysema, unspecified: Secondary | ICD-10-CM | POA: Diagnosis not present

## 2016-12-05 DIAGNOSIS — Z79899 Other long term (current) drug therapy: Secondary | ICD-10-CM | POA: Diagnosis not present

## 2016-12-08 DIAGNOSIS — E1122 Type 2 diabetes mellitus with diabetic chronic kidney disease: Secondary | ICD-10-CM | POA: Diagnosis not present

## 2016-12-08 DIAGNOSIS — J9611 Chronic respiratory failure with hypoxia: Secondary | ICD-10-CM | POA: Diagnosis not present

## 2016-12-08 DIAGNOSIS — N183 Chronic kidney disease, stage 3 (moderate): Secondary | ICD-10-CM | POA: Diagnosis not present

## 2016-12-08 DIAGNOSIS — I5023 Acute on chronic systolic (congestive) heart failure: Secondary | ICD-10-CM | POA: Diagnosis not present

## 2016-12-08 DIAGNOSIS — I130 Hypertensive heart and chronic kidney disease with heart failure and stage 1 through stage 4 chronic kidney disease, or unspecified chronic kidney disease: Secondary | ICD-10-CM | POA: Diagnosis not present

## 2016-12-08 DIAGNOSIS — J439 Emphysema, unspecified: Secondary | ICD-10-CM | POA: Diagnosis not present

## 2016-12-16 DIAGNOSIS — I5023 Acute on chronic systolic (congestive) heart failure: Secondary | ICD-10-CM | POA: Diagnosis not present

## 2016-12-16 DIAGNOSIS — E1122 Type 2 diabetes mellitus with diabetic chronic kidney disease: Secondary | ICD-10-CM | POA: Diagnosis not present

## 2016-12-16 DIAGNOSIS — I130 Hypertensive heart and chronic kidney disease with heart failure and stage 1 through stage 4 chronic kidney disease, or unspecified chronic kidney disease: Secondary | ICD-10-CM | POA: Diagnosis not present

## 2016-12-16 DIAGNOSIS — J9611 Chronic respiratory failure with hypoxia: Secondary | ICD-10-CM | POA: Diagnosis not present

## 2016-12-16 DIAGNOSIS — N183 Chronic kidney disease, stage 3 (moderate): Secondary | ICD-10-CM | POA: Diagnosis not present

## 2016-12-16 DIAGNOSIS — J439 Emphysema, unspecified: Secondary | ICD-10-CM | POA: Diagnosis not present

## 2016-12-25 DIAGNOSIS — J9611 Chronic respiratory failure with hypoxia: Secondary | ICD-10-CM | POA: Diagnosis not present

## 2016-12-25 DIAGNOSIS — I130 Hypertensive heart and chronic kidney disease with heart failure and stage 1 through stage 4 chronic kidney disease, or unspecified chronic kidney disease: Secondary | ICD-10-CM | POA: Diagnosis not present

## 2016-12-25 DIAGNOSIS — E1122 Type 2 diabetes mellitus with diabetic chronic kidney disease: Secondary | ICD-10-CM | POA: Diagnosis not present

## 2016-12-25 DIAGNOSIS — J439 Emphysema, unspecified: Secondary | ICD-10-CM | POA: Diagnosis not present

## 2016-12-25 DIAGNOSIS — N183 Chronic kidney disease, stage 3 (moderate): Secondary | ICD-10-CM | POA: Diagnosis not present

## 2016-12-25 DIAGNOSIS — I5023 Acute on chronic systolic (congestive) heart failure: Secondary | ICD-10-CM | POA: Diagnosis not present

## 2017-01-01 DIAGNOSIS — J9611 Chronic respiratory failure with hypoxia: Secondary | ICD-10-CM | POA: Diagnosis not present

## 2017-01-01 DIAGNOSIS — J439 Emphysema, unspecified: Secondary | ICD-10-CM | POA: Diagnosis not present

## 2017-01-01 DIAGNOSIS — E1122 Type 2 diabetes mellitus with diabetic chronic kidney disease: Secondary | ICD-10-CM | POA: Diagnosis not present

## 2017-01-01 DIAGNOSIS — N183 Chronic kidney disease, stage 3 (moderate): Secondary | ICD-10-CM | POA: Diagnosis not present

## 2017-01-01 DIAGNOSIS — I5023 Acute on chronic systolic (congestive) heart failure: Secondary | ICD-10-CM | POA: Diagnosis not present

## 2017-01-01 DIAGNOSIS — I130 Hypertensive heart and chronic kidney disease with heart failure and stage 1 through stage 4 chronic kidney disease, or unspecified chronic kidney disease: Secondary | ICD-10-CM | POA: Diagnosis not present

## 2017-01-08 DIAGNOSIS — N183 Chronic kidney disease, stage 3 (moderate): Secondary | ICD-10-CM | POA: Diagnosis not present

## 2017-01-08 DIAGNOSIS — E1122 Type 2 diabetes mellitus with diabetic chronic kidney disease: Secondary | ICD-10-CM | POA: Diagnosis not present

## 2017-01-08 DIAGNOSIS — J439 Emphysema, unspecified: Secondary | ICD-10-CM | POA: Diagnosis not present

## 2017-01-08 DIAGNOSIS — I130 Hypertensive heart and chronic kidney disease with heart failure and stage 1 through stage 4 chronic kidney disease, or unspecified chronic kidney disease: Secondary | ICD-10-CM | POA: Diagnosis not present

## 2017-01-08 DIAGNOSIS — I5023 Acute on chronic systolic (congestive) heart failure: Secondary | ICD-10-CM | POA: Diagnosis not present

## 2017-01-08 DIAGNOSIS — J9611 Chronic respiratory failure with hypoxia: Secondary | ICD-10-CM | POA: Diagnosis not present

## 2017-01-15 DIAGNOSIS — N183 Chronic kidney disease, stage 3 (moderate): Secondary | ICD-10-CM | POA: Diagnosis not present

## 2017-01-15 DIAGNOSIS — I5023 Acute on chronic systolic (congestive) heart failure: Secondary | ICD-10-CM | POA: Diagnosis not present

## 2017-01-15 DIAGNOSIS — J439 Emphysema, unspecified: Secondary | ICD-10-CM | POA: Diagnosis not present

## 2017-01-15 DIAGNOSIS — J9611 Chronic respiratory failure with hypoxia: Secondary | ICD-10-CM | POA: Diagnosis not present

## 2017-01-15 DIAGNOSIS — I130 Hypertensive heart and chronic kidney disease with heart failure and stage 1 through stage 4 chronic kidney disease, or unspecified chronic kidney disease: Secondary | ICD-10-CM | POA: Diagnosis not present

## 2017-01-15 DIAGNOSIS — E1122 Type 2 diabetes mellitus with diabetic chronic kidney disease: Secondary | ICD-10-CM | POA: Diagnosis not present

## 2017-01-22 DIAGNOSIS — N183 Chronic kidney disease, stage 3 (moderate): Secondary | ICD-10-CM | POA: Diagnosis not present

## 2017-01-22 DIAGNOSIS — J9611 Chronic respiratory failure with hypoxia: Secondary | ICD-10-CM | POA: Diagnosis not present

## 2017-01-22 DIAGNOSIS — I130 Hypertensive heart and chronic kidney disease with heart failure and stage 1 through stage 4 chronic kidney disease, or unspecified chronic kidney disease: Secondary | ICD-10-CM | POA: Diagnosis not present

## 2017-01-22 DIAGNOSIS — I5023 Acute on chronic systolic (congestive) heart failure: Secondary | ICD-10-CM | POA: Diagnosis not present

## 2017-01-22 DIAGNOSIS — E1122 Type 2 diabetes mellitus with diabetic chronic kidney disease: Secondary | ICD-10-CM | POA: Diagnosis not present

## 2017-01-22 DIAGNOSIS — J439 Emphysema, unspecified: Secondary | ICD-10-CM | POA: Diagnosis not present

## 2017-01-30 DIAGNOSIS — J439 Emphysema, unspecified: Secondary | ICD-10-CM | POA: Diagnosis not present

## 2017-01-30 DIAGNOSIS — E1122 Type 2 diabetes mellitus with diabetic chronic kidney disease: Secondary | ICD-10-CM | POA: Diagnosis not present

## 2017-01-30 DIAGNOSIS — I5023 Acute on chronic systolic (congestive) heart failure: Secondary | ICD-10-CM | POA: Diagnosis not present

## 2017-01-30 DIAGNOSIS — N183 Chronic kidney disease, stage 3 (moderate): Secondary | ICD-10-CM | POA: Diagnosis not present

## 2017-01-30 DIAGNOSIS — J9611 Chronic respiratory failure with hypoxia: Secondary | ICD-10-CM | POA: Diagnosis not present

## 2017-01-30 DIAGNOSIS — I130 Hypertensive heart and chronic kidney disease with heart failure and stage 1 through stage 4 chronic kidney disease, or unspecified chronic kidney disease: Secondary | ICD-10-CM | POA: Diagnosis not present

## 2017-02-04 DIAGNOSIS — I429 Cardiomyopathy, unspecified: Secondary | ICD-10-CM | POA: Diagnosis not present

## 2017-02-04 DIAGNOSIS — I2510 Atherosclerotic heart disease of native coronary artery without angina pectoris: Secondary | ICD-10-CM | POA: Diagnosis not present

## 2017-02-04 DIAGNOSIS — F419 Anxiety disorder, unspecified: Secondary | ICD-10-CM | POA: Diagnosis not present

## 2017-02-04 DIAGNOSIS — I509 Heart failure, unspecified: Secondary | ICD-10-CM | POA: Diagnosis not present

## 2017-02-04 DIAGNOSIS — Z79899 Other long term (current) drug therapy: Secondary | ICD-10-CM | POA: Diagnosis not present

## 2017-02-04 DIAGNOSIS — I10 Essential (primary) hypertension: Secondary | ICD-10-CM | POA: Diagnosis not present

## 2017-02-04 DIAGNOSIS — J441 Chronic obstructive pulmonary disease with (acute) exacerbation: Secondary | ICD-10-CM | POA: Diagnosis not present

## 2017-02-05 DIAGNOSIS — H353232 Exudative age-related macular degeneration, bilateral, with inactive choroidal neovascularization: Secondary | ICD-10-CM | POA: Diagnosis not present

## 2017-03-05 DIAGNOSIS — H353232 Exudative age-related macular degeneration, bilateral, with inactive choroidal neovascularization: Secondary | ICD-10-CM | POA: Diagnosis not present

## 2017-04-04 DIAGNOSIS — K219 Gastro-esophageal reflux disease without esophagitis: Secondary | ICD-10-CM | POA: Diagnosis present

## 2017-04-04 DIAGNOSIS — J189 Pneumonia, unspecified organism: Secondary | ICD-10-CM | POA: Diagnosis not present

## 2017-04-04 DIAGNOSIS — Z7982 Long term (current) use of aspirin: Secondary | ICD-10-CM | POA: Diagnosis not present

## 2017-04-04 DIAGNOSIS — I2510 Atherosclerotic heart disease of native coronary artery without angina pectoris: Secondary | ICD-10-CM | POA: Diagnosis not present

## 2017-04-04 DIAGNOSIS — G8929 Other chronic pain: Secondary | ICD-10-CM | POA: Diagnosis present

## 2017-04-04 DIAGNOSIS — J441 Chronic obstructive pulmonary disease with (acute) exacerbation: Secondary | ICD-10-CM | POA: Diagnosis not present

## 2017-04-04 DIAGNOSIS — J9690 Respiratory failure, unspecified, unspecified whether with hypoxia or hypercapnia: Secondary | ICD-10-CM | POA: Diagnosis not present

## 2017-04-04 DIAGNOSIS — I252 Old myocardial infarction: Secondary | ICD-10-CM | POA: Diagnosis not present

## 2017-04-04 DIAGNOSIS — I5021 Acute systolic (congestive) heart failure: Secondary | ICD-10-CM | POA: Diagnosis not present

## 2017-04-04 DIAGNOSIS — Z951 Presence of aortocoronary bypass graft: Secondary | ICD-10-CM | POA: Diagnosis not present

## 2017-04-04 DIAGNOSIS — N179 Acute kidney failure, unspecified: Secondary | ICD-10-CM | POA: Diagnosis not present

## 2017-04-04 DIAGNOSIS — J440 Chronic obstructive pulmonary disease with acute lower respiratory infection: Secondary | ICD-10-CM | POA: Diagnosis not present

## 2017-04-04 DIAGNOSIS — Z79899 Other long term (current) drug therapy: Secondary | ICD-10-CM | POA: Diagnosis not present

## 2017-04-04 DIAGNOSIS — I482 Chronic atrial fibrillation: Secondary | ICD-10-CM | POA: Diagnosis not present

## 2017-04-04 DIAGNOSIS — M545 Low back pain: Secondary | ICD-10-CM | POA: Diagnosis not present

## 2017-04-04 DIAGNOSIS — Z9981 Dependence on supplemental oxygen: Secondary | ICD-10-CM | POA: Diagnosis not present

## 2017-04-04 DIAGNOSIS — J9602 Acute respiratory failure with hypercapnia: Secondary | ICD-10-CM | POA: Diagnosis not present

## 2017-04-04 DIAGNOSIS — I110 Hypertensive heart disease with heart failure: Secondary | ICD-10-CM | POA: Diagnosis not present

## 2017-04-04 DIAGNOSIS — Z8679 Personal history of other diseases of the circulatory system: Secondary | ICD-10-CM | POA: Diagnosis not present

## 2017-04-04 DIAGNOSIS — R0602 Shortness of breath: Secondary | ICD-10-CM | POA: Diagnosis not present

## 2017-04-04 DIAGNOSIS — Z95810 Presence of automatic (implantable) cardiac defibrillator: Secondary | ICD-10-CM | POA: Diagnosis not present

## 2017-04-04 DIAGNOSIS — R05 Cough: Secondary | ICD-10-CM | POA: Diagnosis not present

## 2017-04-04 DIAGNOSIS — I509 Heart failure, unspecified: Secondary | ICD-10-CM | POA: Diagnosis not present

## 2017-04-04 DIAGNOSIS — J90 Pleural effusion, not elsewhere classified: Secondary | ICD-10-CM | POA: Diagnosis not present

## 2017-04-04 DIAGNOSIS — I5023 Acute on chronic systolic (congestive) heart failure: Secondary | ICD-10-CM | POA: Diagnosis present

## 2017-04-04 DIAGNOSIS — R069 Unspecified abnormalities of breathing: Secondary | ICD-10-CM | POA: Diagnosis not present

## 2017-04-04 DIAGNOSIS — E7800 Pure hypercholesterolemia, unspecified: Secondary | ICD-10-CM | POA: Diagnosis present

## 2017-04-04 DIAGNOSIS — J690 Pneumonitis due to inhalation of food and vomit: Secondary | ICD-10-CM | POA: Diagnosis not present

## 2017-04-04 DIAGNOSIS — R54 Age-related physical debility: Secondary | ICD-10-CM | POA: Diagnosis present

## 2017-04-04 DIAGNOSIS — E119 Type 2 diabetes mellitus without complications: Secondary | ICD-10-CM | POA: Diagnosis not present

## 2017-04-04 DIAGNOSIS — J9601 Acute respiratory failure with hypoxia: Secondary | ICD-10-CM | POA: Diagnosis not present

## 2017-04-05 DIAGNOSIS — R0602 Shortness of breath: Secondary | ICD-10-CM

## 2017-04-06 DIAGNOSIS — J189 Pneumonia, unspecified organism: Secondary | ICD-10-CM

## 2017-04-06 DIAGNOSIS — R0602 Shortness of breath: Secondary | ICD-10-CM

## 2017-04-08 DIAGNOSIS — I5021 Acute systolic (congestive) heart failure: Secondary | ICD-10-CM

## 2017-04-08 DIAGNOSIS — I2510 Atherosclerotic heart disease of native coronary artery without angina pectoris: Secondary | ICD-10-CM

## 2017-04-10 DIAGNOSIS — I2510 Atherosclerotic heart disease of native coronary artery without angina pectoris: Secondary | ICD-10-CM

## 2017-04-10 DIAGNOSIS — I5021 Acute systolic (congestive) heart failure: Secondary | ICD-10-CM

## 2017-04-11 DIAGNOSIS — N183 Chronic kidney disease, stage 3 (moderate): Secondary | ICD-10-CM | POA: Diagnosis not present

## 2017-04-11 DIAGNOSIS — Z951 Presence of aortocoronary bypass graft: Secondary | ICD-10-CM | POA: Diagnosis not present

## 2017-04-11 DIAGNOSIS — Z7952 Long term (current) use of systemic steroids: Secondary | ICD-10-CM | POA: Diagnosis not present

## 2017-04-11 DIAGNOSIS — I5023 Acute on chronic systolic (congestive) heart failure: Secondary | ICD-10-CM | POA: Diagnosis not present

## 2017-04-11 DIAGNOSIS — Z87891 Personal history of nicotine dependence: Secondary | ICD-10-CM | POA: Diagnosis not present

## 2017-04-11 DIAGNOSIS — J189 Pneumonia, unspecified organism: Secondary | ICD-10-CM | POA: Diagnosis not present

## 2017-04-11 DIAGNOSIS — Z9981 Dependence on supplemental oxygen: Secondary | ICD-10-CM | POA: Diagnosis not present

## 2017-04-11 DIAGNOSIS — I130 Hypertensive heart and chronic kidney disease with heart failure and stage 1 through stage 4 chronic kidney disease, or unspecified chronic kidney disease: Secondary | ICD-10-CM | POA: Diagnosis not present

## 2017-04-11 DIAGNOSIS — J9611 Chronic respiratory failure with hypoxia: Secondary | ICD-10-CM | POA: Diagnosis not present

## 2017-04-11 DIAGNOSIS — I255 Ischemic cardiomyopathy: Secondary | ICD-10-CM | POA: Diagnosis not present

## 2017-04-11 DIAGNOSIS — F419 Anxiety disorder, unspecified: Secondary | ICD-10-CM | POA: Diagnosis not present

## 2017-04-11 DIAGNOSIS — I2510 Atherosclerotic heart disease of native coronary artery without angina pectoris: Secondary | ICD-10-CM | POA: Diagnosis not present

## 2017-04-11 DIAGNOSIS — Z7951 Long term (current) use of inhaled steroids: Secondary | ICD-10-CM | POA: Diagnosis not present

## 2017-04-11 DIAGNOSIS — Z7982 Long term (current) use of aspirin: Secondary | ICD-10-CM | POA: Diagnosis not present

## 2017-04-11 DIAGNOSIS — Z95810 Presence of automatic (implantable) cardiac defibrillator: Secondary | ICD-10-CM | POA: Diagnosis not present

## 2017-04-11 DIAGNOSIS — J439 Emphysema, unspecified: Secondary | ICD-10-CM | POA: Diagnosis not present

## 2017-04-11 DIAGNOSIS — I482 Chronic atrial fibrillation: Secondary | ICD-10-CM | POA: Diagnosis not present

## 2017-04-11 DIAGNOSIS — E1122 Type 2 diabetes mellitus with diabetic chronic kidney disease: Secondary | ICD-10-CM | POA: Diagnosis not present

## 2017-04-11 DIAGNOSIS — Z9181 History of falling: Secondary | ICD-10-CM | POA: Diagnosis not present

## 2017-04-13 DIAGNOSIS — J439 Emphysema, unspecified: Secondary | ICD-10-CM | POA: Diagnosis not present

## 2017-04-13 DIAGNOSIS — E1122 Type 2 diabetes mellitus with diabetic chronic kidney disease: Secondary | ICD-10-CM | POA: Diagnosis not present

## 2017-04-13 DIAGNOSIS — J189 Pneumonia, unspecified organism: Secondary | ICD-10-CM | POA: Diagnosis not present

## 2017-04-13 DIAGNOSIS — J9611 Chronic respiratory failure with hypoxia: Secondary | ICD-10-CM | POA: Diagnosis not present

## 2017-04-13 DIAGNOSIS — I130 Hypertensive heart and chronic kidney disease with heart failure and stage 1 through stage 4 chronic kidney disease, or unspecified chronic kidney disease: Secondary | ICD-10-CM | POA: Diagnosis not present

## 2017-04-13 DIAGNOSIS — I5023 Acute on chronic systolic (congestive) heart failure: Secondary | ICD-10-CM | POA: Diagnosis not present

## 2017-04-15 DIAGNOSIS — E1122 Type 2 diabetes mellitus with diabetic chronic kidney disease: Secondary | ICD-10-CM | POA: Diagnosis not present

## 2017-04-15 DIAGNOSIS — J9611 Chronic respiratory failure with hypoxia: Secondary | ICD-10-CM | POA: Diagnosis not present

## 2017-04-15 DIAGNOSIS — J439 Emphysema, unspecified: Secondary | ICD-10-CM | POA: Diagnosis not present

## 2017-04-15 DIAGNOSIS — I130 Hypertensive heart and chronic kidney disease with heart failure and stage 1 through stage 4 chronic kidney disease, or unspecified chronic kidney disease: Secondary | ICD-10-CM | POA: Diagnosis not present

## 2017-04-15 DIAGNOSIS — I5023 Acute on chronic systolic (congestive) heart failure: Secondary | ICD-10-CM | POA: Diagnosis not present

## 2017-04-15 DIAGNOSIS — J189 Pneumonia, unspecified organism: Secondary | ICD-10-CM | POA: Diagnosis not present

## 2017-04-17 DIAGNOSIS — I5023 Acute on chronic systolic (congestive) heart failure: Secondary | ICD-10-CM | POA: Diagnosis not present

## 2017-04-17 DIAGNOSIS — J189 Pneumonia, unspecified organism: Secondary | ICD-10-CM | POA: Diagnosis not present

## 2017-04-17 DIAGNOSIS — E1122 Type 2 diabetes mellitus with diabetic chronic kidney disease: Secondary | ICD-10-CM | POA: Diagnosis not present

## 2017-04-17 DIAGNOSIS — J439 Emphysema, unspecified: Secondary | ICD-10-CM | POA: Diagnosis not present

## 2017-04-17 DIAGNOSIS — I130 Hypertensive heart and chronic kidney disease with heart failure and stage 1 through stage 4 chronic kidney disease, or unspecified chronic kidney disease: Secondary | ICD-10-CM | POA: Diagnosis not present

## 2017-04-17 DIAGNOSIS — J9611 Chronic respiratory failure with hypoxia: Secondary | ICD-10-CM | POA: Diagnosis not present

## 2017-04-20 DIAGNOSIS — I5023 Acute on chronic systolic (congestive) heart failure: Secondary | ICD-10-CM | POA: Diagnosis not present

## 2017-04-20 DIAGNOSIS — E1122 Type 2 diabetes mellitus with diabetic chronic kidney disease: Secondary | ICD-10-CM | POA: Diagnosis not present

## 2017-04-20 DIAGNOSIS — J189 Pneumonia, unspecified organism: Secondary | ICD-10-CM | POA: Diagnosis not present

## 2017-04-20 DIAGNOSIS — J439 Emphysema, unspecified: Secondary | ICD-10-CM | POA: Diagnosis not present

## 2017-04-20 DIAGNOSIS — J9611 Chronic respiratory failure with hypoxia: Secondary | ICD-10-CM | POA: Diagnosis not present

## 2017-04-20 DIAGNOSIS — I130 Hypertensive heart and chronic kidney disease with heart failure and stage 1 through stage 4 chronic kidney disease, or unspecified chronic kidney disease: Secondary | ICD-10-CM | POA: Diagnosis not present

## 2017-04-22 DIAGNOSIS — I5023 Acute on chronic systolic (congestive) heart failure: Secondary | ICD-10-CM | POA: Diagnosis not present

## 2017-04-22 DIAGNOSIS — J439 Emphysema, unspecified: Secondary | ICD-10-CM | POA: Diagnosis not present

## 2017-04-22 DIAGNOSIS — E1122 Type 2 diabetes mellitus with diabetic chronic kidney disease: Secondary | ICD-10-CM | POA: Diagnosis not present

## 2017-04-22 DIAGNOSIS — J9611 Chronic respiratory failure with hypoxia: Secondary | ICD-10-CM | POA: Diagnosis not present

## 2017-04-22 DIAGNOSIS — I130 Hypertensive heart and chronic kidney disease with heart failure and stage 1 through stage 4 chronic kidney disease, or unspecified chronic kidney disease: Secondary | ICD-10-CM | POA: Diagnosis not present

## 2017-04-22 DIAGNOSIS — J189 Pneumonia, unspecified organism: Secondary | ICD-10-CM | POA: Diagnosis not present

## 2017-04-24 DIAGNOSIS — J9611 Chronic respiratory failure with hypoxia: Secondary | ICD-10-CM | POA: Diagnosis not present

## 2017-04-24 DIAGNOSIS — I130 Hypertensive heart and chronic kidney disease with heart failure and stage 1 through stage 4 chronic kidney disease, or unspecified chronic kidney disease: Secondary | ICD-10-CM | POA: Diagnosis not present

## 2017-04-24 DIAGNOSIS — J439 Emphysema, unspecified: Secondary | ICD-10-CM | POA: Diagnosis not present

## 2017-04-24 DIAGNOSIS — I5023 Acute on chronic systolic (congestive) heart failure: Secondary | ICD-10-CM | POA: Diagnosis not present

## 2017-04-24 DIAGNOSIS — E1122 Type 2 diabetes mellitus with diabetic chronic kidney disease: Secondary | ICD-10-CM | POA: Diagnosis not present

## 2017-04-24 DIAGNOSIS — J189 Pneumonia, unspecified organism: Secondary | ICD-10-CM | POA: Diagnosis not present

## 2017-04-28 DIAGNOSIS — I130 Hypertensive heart and chronic kidney disease with heart failure and stage 1 through stage 4 chronic kidney disease, or unspecified chronic kidney disease: Secondary | ICD-10-CM | POA: Diagnosis not present

## 2017-04-28 DIAGNOSIS — E1122 Type 2 diabetes mellitus with diabetic chronic kidney disease: Secondary | ICD-10-CM | POA: Diagnosis not present

## 2017-04-28 DIAGNOSIS — J189 Pneumonia, unspecified organism: Secondary | ICD-10-CM | POA: Diagnosis not present

## 2017-04-28 DIAGNOSIS — J439 Emphysema, unspecified: Secondary | ICD-10-CM | POA: Diagnosis not present

## 2017-04-28 DIAGNOSIS — E119 Type 2 diabetes mellitus without complications: Secondary | ICD-10-CM | POA: Diagnosis not present

## 2017-04-28 DIAGNOSIS — J9611 Chronic respiratory failure with hypoxia: Secondary | ICD-10-CM | POA: Diagnosis not present

## 2017-04-28 DIAGNOSIS — I5023 Acute on chronic systolic (congestive) heart failure: Secondary | ICD-10-CM | POA: Diagnosis not present

## 2017-04-30 DIAGNOSIS — E1122 Type 2 diabetes mellitus with diabetic chronic kidney disease: Secondary | ICD-10-CM | POA: Diagnosis not present

## 2017-04-30 DIAGNOSIS — I5023 Acute on chronic systolic (congestive) heart failure: Secondary | ICD-10-CM | POA: Diagnosis not present

## 2017-04-30 DIAGNOSIS — I130 Hypertensive heart and chronic kidney disease with heart failure and stage 1 through stage 4 chronic kidney disease, or unspecified chronic kidney disease: Secondary | ICD-10-CM | POA: Diagnosis not present

## 2017-04-30 DIAGNOSIS — J439 Emphysema, unspecified: Secondary | ICD-10-CM | POA: Diagnosis not present

## 2017-04-30 DIAGNOSIS — J9611 Chronic respiratory failure with hypoxia: Secondary | ICD-10-CM | POA: Diagnosis not present

## 2017-04-30 DIAGNOSIS — J189 Pneumonia, unspecified organism: Secondary | ICD-10-CM | POA: Diagnosis not present

## 2017-05-04 DIAGNOSIS — I130 Hypertensive heart and chronic kidney disease with heart failure and stage 1 through stage 4 chronic kidney disease, or unspecified chronic kidney disease: Secondary | ICD-10-CM | POA: Diagnosis not present

## 2017-05-04 DIAGNOSIS — J9611 Chronic respiratory failure with hypoxia: Secondary | ICD-10-CM | POA: Diagnosis not present

## 2017-05-04 DIAGNOSIS — J189 Pneumonia, unspecified organism: Secondary | ICD-10-CM | POA: Diagnosis not present

## 2017-05-04 DIAGNOSIS — J439 Emphysema, unspecified: Secondary | ICD-10-CM | POA: Diagnosis not present

## 2017-05-04 DIAGNOSIS — E1122 Type 2 diabetes mellitus with diabetic chronic kidney disease: Secondary | ICD-10-CM | POA: Diagnosis not present

## 2017-05-04 DIAGNOSIS — I5023 Acute on chronic systolic (congestive) heart failure: Secondary | ICD-10-CM | POA: Diagnosis not present

## 2017-05-05 DIAGNOSIS — E1122 Type 2 diabetes mellitus with diabetic chronic kidney disease: Secondary | ICD-10-CM | POA: Diagnosis not present

## 2017-05-05 DIAGNOSIS — I130 Hypertensive heart and chronic kidney disease with heart failure and stage 1 through stage 4 chronic kidney disease, or unspecified chronic kidney disease: Secondary | ICD-10-CM | POA: Diagnosis not present

## 2017-05-05 DIAGNOSIS — J189 Pneumonia, unspecified organism: Secondary | ICD-10-CM | POA: Diagnosis not present

## 2017-05-05 DIAGNOSIS — J9611 Chronic respiratory failure with hypoxia: Secondary | ICD-10-CM | POA: Diagnosis not present

## 2017-05-05 DIAGNOSIS — I5023 Acute on chronic systolic (congestive) heart failure: Secondary | ICD-10-CM | POA: Diagnosis not present

## 2017-05-05 DIAGNOSIS — J439 Emphysema, unspecified: Secondary | ICD-10-CM | POA: Diagnosis not present

## 2017-05-07 DIAGNOSIS — J189 Pneumonia, unspecified organism: Secondary | ICD-10-CM | POA: Diagnosis not present

## 2017-05-07 DIAGNOSIS — I130 Hypertensive heart and chronic kidney disease with heart failure and stage 1 through stage 4 chronic kidney disease, or unspecified chronic kidney disease: Secondary | ICD-10-CM | POA: Diagnosis not present

## 2017-05-07 DIAGNOSIS — E1122 Type 2 diabetes mellitus with diabetic chronic kidney disease: Secondary | ICD-10-CM | POA: Diagnosis not present

## 2017-05-07 DIAGNOSIS — J9611 Chronic respiratory failure with hypoxia: Secondary | ICD-10-CM | POA: Diagnosis not present

## 2017-05-07 DIAGNOSIS — J439 Emphysema, unspecified: Secondary | ICD-10-CM | POA: Diagnosis not present

## 2017-05-07 DIAGNOSIS — I5023 Acute on chronic systolic (congestive) heart failure: Secondary | ICD-10-CM | POA: Diagnosis not present

## 2017-05-11 DIAGNOSIS — J439 Emphysema, unspecified: Secondary | ICD-10-CM | POA: Diagnosis not present

## 2017-05-11 DIAGNOSIS — J189 Pneumonia, unspecified organism: Secondary | ICD-10-CM | POA: Diagnosis not present

## 2017-05-11 DIAGNOSIS — I5023 Acute on chronic systolic (congestive) heart failure: Secondary | ICD-10-CM | POA: Diagnosis not present

## 2017-05-11 DIAGNOSIS — J9611 Chronic respiratory failure with hypoxia: Secondary | ICD-10-CM | POA: Diagnosis not present

## 2017-05-11 DIAGNOSIS — I130 Hypertensive heart and chronic kidney disease with heart failure and stage 1 through stage 4 chronic kidney disease, or unspecified chronic kidney disease: Secondary | ICD-10-CM | POA: Diagnosis not present

## 2017-05-11 DIAGNOSIS — E1122 Type 2 diabetes mellitus with diabetic chronic kidney disease: Secondary | ICD-10-CM | POA: Diagnosis not present

## 2017-05-12 DIAGNOSIS — J441 Chronic obstructive pulmonary disease with (acute) exacerbation: Secondary | ICD-10-CM | POA: Diagnosis not present

## 2017-05-12 DIAGNOSIS — F419 Anxiety disorder, unspecified: Secondary | ICD-10-CM | POA: Diagnosis not present

## 2017-05-12 DIAGNOSIS — I509 Heart failure, unspecified: Secondary | ICD-10-CM | POA: Diagnosis not present

## 2017-05-12 DIAGNOSIS — M546 Pain in thoracic spine: Secondary | ICD-10-CM | POA: Diagnosis not present

## 2017-05-12 DIAGNOSIS — N183 Chronic kidney disease, stage 3 (moderate): Secondary | ICD-10-CM | POA: Diagnosis not present

## 2017-05-12 DIAGNOSIS — J9611 Chronic respiratory failure with hypoxia: Secondary | ICD-10-CM | POA: Diagnosis not present

## 2017-05-14 DIAGNOSIS — I130 Hypertensive heart and chronic kidney disease with heart failure and stage 1 through stage 4 chronic kidney disease, or unspecified chronic kidney disease: Secondary | ICD-10-CM | POA: Diagnosis not present

## 2017-05-14 DIAGNOSIS — J439 Emphysema, unspecified: Secondary | ICD-10-CM | POA: Diagnosis not present

## 2017-05-14 DIAGNOSIS — E1122 Type 2 diabetes mellitus with diabetic chronic kidney disease: Secondary | ICD-10-CM | POA: Diagnosis not present

## 2017-05-14 DIAGNOSIS — J9611 Chronic respiratory failure with hypoxia: Secondary | ICD-10-CM | POA: Diagnosis not present

## 2017-05-14 DIAGNOSIS — I5023 Acute on chronic systolic (congestive) heart failure: Secondary | ICD-10-CM | POA: Diagnosis not present

## 2017-05-14 DIAGNOSIS — J189 Pneumonia, unspecified organism: Secondary | ICD-10-CM | POA: Diagnosis not present

## 2017-05-15 DIAGNOSIS — Z87448 Personal history of other diseases of urinary system: Secondary | ICD-10-CM | POA: Diagnosis not present

## 2017-05-15 DIAGNOSIS — Z8679 Personal history of other diseases of the circulatory system: Secondary | ICD-10-CM | POA: Diagnosis not present

## 2017-05-15 DIAGNOSIS — Z8701 Personal history of pneumonia (recurrent): Secondary | ICD-10-CM | POA: Diagnosis not present

## 2017-05-15 DIAGNOSIS — I219 Acute myocardial infarction, unspecified: Secondary | ICD-10-CM | POA: Diagnosis not present

## 2017-05-15 DIAGNOSIS — J449 Chronic obstructive pulmonary disease, unspecified: Secondary | ICD-10-CM | POA: Diagnosis not present

## 2017-05-15 DIAGNOSIS — Z95810 Presence of automatic (implantable) cardiac defibrillator: Secondary | ICD-10-CM | POA: Diagnosis not present

## 2017-05-15 DIAGNOSIS — I509 Heart failure, unspecified: Secondary | ICD-10-CM | POA: Diagnosis not present

## 2017-05-15 DIAGNOSIS — Z86711 Personal history of pulmonary embolism: Secondary | ICD-10-CM | POA: Diagnosis not present

## 2017-05-15 DIAGNOSIS — Z8639 Personal history of other endocrine, nutritional and metabolic disease: Secondary | ICD-10-CM | POA: Diagnosis not present

## 2017-05-16 DIAGNOSIS — Z888 Allergy status to other drugs, medicaments and biological substances status: Secondary | ICD-10-CM | POA: Diagnosis not present

## 2017-05-16 DIAGNOSIS — I255 Ischemic cardiomyopathy: Secondary | ICD-10-CM | POA: Diagnosis present

## 2017-05-16 DIAGNOSIS — E119 Type 2 diabetes mellitus without complications: Secondary | ICD-10-CM | POA: Diagnosis not present

## 2017-05-16 DIAGNOSIS — R531 Weakness: Secondary | ICD-10-CM | POA: Diagnosis not present

## 2017-05-16 DIAGNOSIS — Z9981 Dependence on supplemental oxygen: Secondary | ICD-10-CM | POA: Diagnosis not present

## 2017-05-16 DIAGNOSIS — J9601 Acute respiratory failure with hypoxia: Secondary | ICD-10-CM | POA: Diagnosis not present

## 2017-05-16 DIAGNOSIS — B952 Enterococcus as the cause of diseases classified elsewhere: Secondary | ICD-10-CM | POA: Diagnosis present

## 2017-05-16 DIAGNOSIS — N289 Disorder of kidney and ureter, unspecified: Secondary | ICD-10-CM | POA: Diagnosis not present

## 2017-05-16 DIAGNOSIS — I509 Heart failure, unspecified: Secondary | ICD-10-CM | POA: Diagnosis not present

## 2017-05-16 DIAGNOSIS — K219 Gastro-esophageal reflux disease without esophagitis: Secondary | ICD-10-CM | POA: Diagnosis present

## 2017-05-16 DIAGNOSIS — Z951 Presence of aortocoronary bypass graft: Secondary | ICD-10-CM | POA: Diagnosis not present

## 2017-05-16 DIAGNOSIS — Z880 Allergy status to penicillin: Secondary | ICD-10-CM | POA: Diagnosis not present

## 2017-05-16 DIAGNOSIS — R0602 Shortness of breath: Secondary | ICD-10-CM | POA: Diagnosis not present

## 2017-05-16 DIAGNOSIS — F418 Other specified anxiety disorders: Secondary | ICD-10-CM | POA: Diagnosis present

## 2017-05-16 DIAGNOSIS — R404 Transient alteration of awareness: Secondary | ICD-10-CM | POA: Diagnosis not present

## 2017-05-16 DIAGNOSIS — I2510 Atherosclerotic heart disease of native coronary artery without angina pectoris: Secondary | ICD-10-CM | POA: Diagnosis not present

## 2017-05-16 DIAGNOSIS — Z7982 Long term (current) use of aspirin: Secondary | ICD-10-CM | POA: Diagnosis not present

## 2017-05-16 DIAGNOSIS — I252 Old myocardial infarction: Secondary | ICD-10-CM | POA: Diagnosis not present

## 2017-05-16 DIAGNOSIS — M1990 Unspecified osteoarthritis, unspecified site: Secondary | ICD-10-CM | POA: Diagnosis present

## 2017-05-16 DIAGNOSIS — Z950 Presence of cardiac pacemaker: Secondary | ICD-10-CM | POA: Diagnosis not present

## 2017-05-16 DIAGNOSIS — J9691 Respiratory failure, unspecified with hypoxia: Secondary | ICD-10-CM | POA: Diagnosis not present

## 2017-05-16 DIAGNOSIS — I5023 Acute on chronic systolic (congestive) heart failure: Secondary | ICD-10-CM | POA: Diagnosis not present

## 2017-05-16 DIAGNOSIS — J9602 Acute respiratory failure with hypercapnia: Secondary | ICD-10-CM | POA: Diagnosis not present

## 2017-05-16 DIAGNOSIS — J440 Chronic obstructive pulmonary disease with acute lower respiratory infection: Secondary | ICD-10-CM | POA: Diagnosis present

## 2017-05-16 DIAGNOSIS — Z8701 Personal history of pneumonia (recurrent): Secondary | ICD-10-CM | POA: Diagnosis not present

## 2017-05-16 DIAGNOSIS — J441 Chronic obstructive pulmonary disease with (acute) exacerbation: Secondary | ICD-10-CM | POA: Diagnosis not present

## 2017-05-16 DIAGNOSIS — Z881 Allergy status to other antibiotic agents status: Secondary | ICD-10-CM | POA: Diagnosis not present

## 2017-05-16 DIAGNOSIS — N390 Urinary tract infection, site not specified: Secondary | ICD-10-CM | POA: Diagnosis not present

## 2017-05-16 DIAGNOSIS — J189 Pneumonia, unspecified organism: Secondary | ICD-10-CM | POA: Diagnosis not present

## 2017-05-16 DIAGNOSIS — I110 Hypertensive heart disease with heart failure: Secondary | ICD-10-CM | POA: Diagnosis not present

## 2017-05-21 DIAGNOSIS — N183 Chronic kidney disease, stage 3 (moderate): Secondary | ICD-10-CM | POA: Diagnosis not present

## 2017-05-21 DIAGNOSIS — Z9181 History of falling: Secondary | ICD-10-CM | POA: Diagnosis not present

## 2017-05-21 DIAGNOSIS — Z87891 Personal history of nicotine dependence: Secondary | ICD-10-CM | POA: Diagnosis not present

## 2017-05-21 DIAGNOSIS — Z951 Presence of aortocoronary bypass graft: Secondary | ICD-10-CM | POA: Diagnosis not present

## 2017-05-21 DIAGNOSIS — Z7952 Long term (current) use of systemic steroids: Secondary | ICD-10-CM | POA: Diagnosis not present

## 2017-05-21 DIAGNOSIS — N390 Urinary tract infection, site not specified: Secondary | ICD-10-CM | POA: Diagnosis not present

## 2017-05-21 DIAGNOSIS — I2510 Atherosclerotic heart disease of native coronary artery without angina pectoris: Secondary | ICD-10-CM | POA: Diagnosis not present

## 2017-05-21 DIAGNOSIS — E1122 Type 2 diabetes mellitus with diabetic chronic kidney disease: Secondary | ICD-10-CM | POA: Diagnosis not present

## 2017-05-21 DIAGNOSIS — I482 Chronic atrial fibrillation: Secondary | ICD-10-CM | POA: Diagnosis not present

## 2017-05-21 DIAGNOSIS — B952 Enterococcus as the cause of diseases classified elsewhere: Secondary | ICD-10-CM | POA: Diagnosis not present

## 2017-05-21 DIAGNOSIS — Z7951 Long term (current) use of inhaled steroids: Secondary | ICD-10-CM | POA: Diagnosis not present

## 2017-05-21 DIAGNOSIS — I130 Hypertensive heart and chronic kidney disease with heart failure and stage 1 through stage 4 chronic kidney disease, or unspecified chronic kidney disease: Secondary | ICD-10-CM | POA: Diagnosis not present

## 2017-05-21 DIAGNOSIS — Z95810 Presence of automatic (implantable) cardiac defibrillator: Secondary | ICD-10-CM | POA: Diagnosis not present

## 2017-05-21 DIAGNOSIS — B954 Other streptococcus as the cause of diseases classified elsewhere: Secondary | ICD-10-CM | POA: Diagnosis not present

## 2017-05-21 DIAGNOSIS — J189 Pneumonia, unspecified organism: Secondary | ICD-10-CM | POA: Diagnosis not present

## 2017-05-21 DIAGNOSIS — F419 Anxiety disorder, unspecified: Secondary | ICD-10-CM | POA: Diagnosis not present

## 2017-05-21 DIAGNOSIS — I5023 Acute on chronic systolic (congestive) heart failure: Secondary | ICD-10-CM | POA: Diagnosis not present

## 2017-05-21 DIAGNOSIS — Z7982 Long term (current) use of aspirin: Secondary | ICD-10-CM | POA: Diagnosis not present

## 2017-05-21 DIAGNOSIS — J439 Emphysema, unspecified: Secondary | ICD-10-CM | POA: Diagnosis not present

## 2017-05-21 DIAGNOSIS — J9611 Chronic respiratory failure with hypoxia: Secondary | ICD-10-CM | POA: Diagnosis not present

## 2017-05-21 DIAGNOSIS — Z9981 Dependence on supplemental oxygen: Secondary | ICD-10-CM | POA: Diagnosis not present

## 2017-05-21 DIAGNOSIS — I255 Ischemic cardiomyopathy: Secondary | ICD-10-CM | POA: Diagnosis not present

## 2017-05-22 DIAGNOSIS — E1122 Type 2 diabetes mellitus with diabetic chronic kidney disease: Secondary | ICD-10-CM | POA: Diagnosis not present

## 2017-05-22 DIAGNOSIS — J189 Pneumonia, unspecified organism: Secondary | ICD-10-CM | POA: Diagnosis not present

## 2017-05-22 DIAGNOSIS — J439 Emphysema, unspecified: Secondary | ICD-10-CM | POA: Diagnosis not present

## 2017-05-22 DIAGNOSIS — N183 Chronic kidney disease, stage 3 (moderate): Secondary | ICD-10-CM | POA: Diagnosis not present

## 2017-05-22 DIAGNOSIS — I130 Hypertensive heart and chronic kidney disease with heart failure and stage 1 through stage 4 chronic kidney disease, or unspecified chronic kidney disease: Secondary | ICD-10-CM | POA: Diagnosis not present

## 2017-05-22 DIAGNOSIS — J9611 Chronic respiratory failure with hypoxia: Secondary | ICD-10-CM | POA: Diagnosis not present

## 2017-05-25 DIAGNOSIS — Z6823 Body mass index (BMI) 23.0-23.9, adult: Secondary | ICD-10-CM | POA: Diagnosis not present

## 2017-05-25 DIAGNOSIS — R7303 Prediabetes: Secondary | ICD-10-CM | POA: Diagnosis not present

## 2017-05-25 DIAGNOSIS — R5382 Chronic fatigue, unspecified: Secondary | ICD-10-CM | POA: Diagnosis not present

## 2017-05-25 DIAGNOSIS — I2510 Atherosclerotic heart disease of native coronary artery without angina pectoris: Secondary | ICD-10-CM | POA: Diagnosis not present

## 2017-05-25 DIAGNOSIS — Z95810 Presence of automatic (implantable) cardiac defibrillator: Secondary | ICD-10-CM | POA: Diagnosis not present

## 2017-05-25 DIAGNOSIS — R609 Edema, unspecified: Secondary | ICD-10-CM | POA: Diagnosis not present

## 2017-05-25 DIAGNOSIS — E785 Hyperlipidemia, unspecified: Secondary | ICD-10-CM | POA: Diagnosis not present

## 2017-05-25 DIAGNOSIS — J449 Chronic obstructive pulmonary disease, unspecified: Secondary | ICD-10-CM | POA: Diagnosis not present

## 2017-05-25 DIAGNOSIS — I429 Cardiomyopathy, unspecified: Secondary | ICD-10-CM | POA: Diagnosis not present

## 2017-05-25 DIAGNOSIS — I10 Essential (primary) hypertension: Secondary | ICD-10-CM | POA: Diagnosis not present

## 2017-05-25 DIAGNOSIS — I509 Heart failure, unspecified: Secondary | ICD-10-CM | POA: Diagnosis not present

## 2017-05-25 DIAGNOSIS — I513 Intracardiac thrombosis, not elsewhere classified: Secondary | ICD-10-CM | POA: Diagnosis not present

## 2017-05-26 DIAGNOSIS — J439 Emphysema, unspecified: Secondary | ICD-10-CM | POA: Diagnosis not present

## 2017-05-26 DIAGNOSIS — J189 Pneumonia, unspecified organism: Secondary | ICD-10-CM | POA: Diagnosis not present

## 2017-05-26 DIAGNOSIS — E1122 Type 2 diabetes mellitus with diabetic chronic kidney disease: Secondary | ICD-10-CM | POA: Diagnosis not present

## 2017-05-26 DIAGNOSIS — J9611 Chronic respiratory failure with hypoxia: Secondary | ICD-10-CM | POA: Diagnosis not present

## 2017-05-26 DIAGNOSIS — I130 Hypertensive heart and chronic kidney disease with heart failure and stage 1 through stage 4 chronic kidney disease, or unspecified chronic kidney disease: Secondary | ICD-10-CM | POA: Diagnosis not present

## 2017-05-26 DIAGNOSIS — N183 Chronic kidney disease, stage 3 (moderate): Secondary | ICD-10-CM | POA: Diagnosis not present

## 2017-05-29 DIAGNOSIS — J439 Emphysema, unspecified: Secondary | ICD-10-CM | POA: Diagnosis not present

## 2017-05-29 DIAGNOSIS — J189 Pneumonia, unspecified organism: Secondary | ICD-10-CM | POA: Diagnosis not present

## 2017-05-29 DIAGNOSIS — J9611 Chronic respiratory failure with hypoxia: Secondary | ICD-10-CM | POA: Diagnosis not present

## 2017-05-29 DIAGNOSIS — E1122 Type 2 diabetes mellitus with diabetic chronic kidney disease: Secondary | ICD-10-CM | POA: Diagnosis not present

## 2017-05-29 DIAGNOSIS — I130 Hypertensive heart and chronic kidney disease with heart failure and stage 1 through stage 4 chronic kidney disease, or unspecified chronic kidney disease: Secondary | ICD-10-CM | POA: Diagnosis not present

## 2017-05-29 DIAGNOSIS — N183 Chronic kidney disease, stage 3 (moderate): Secondary | ICD-10-CM | POA: Diagnosis not present

## 2017-06-01 DIAGNOSIS — J439 Emphysema, unspecified: Secondary | ICD-10-CM | POA: Diagnosis not present

## 2017-06-01 DIAGNOSIS — I130 Hypertensive heart and chronic kidney disease with heart failure and stage 1 through stage 4 chronic kidney disease, or unspecified chronic kidney disease: Secondary | ICD-10-CM | POA: Diagnosis not present

## 2017-06-01 DIAGNOSIS — J189 Pneumonia, unspecified organism: Secondary | ICD-10-CM | POA: Diagnosis not present

## 2017-06-01 DIAGNOSIS — N183 Chronic kidney disease, stage 3 (moderate): Secondary | ICD-10-CM | POA: Diagnosis not present

## 2017-06-01 DIAGNOSIS — J9611 Chronic respiratory failure with hypoxia: Secondary | ICD-10-CM | POA: Diagnosis not present

## 2017-06-01 DIAGNOSIS — E1122 Type 2 diabetes mellitus with diabetic chronic kidney disease: Secondary | ICD-10-CM | POA: Diagnosis not present

## 2017-06-05 DIAGNOSIS — J9611 Chronic respiratory failure with hypoxia: Secondary | ICD-10-CM | POA: Diagnosis not present

## 2017-06-05 DIAGNOSIS — R609 Edema, unspecified: Secondary | ICD-10-CM | POA: Diagnosis not present

## 2017-06-05 DIAGNOSIS — I429 Cardiomyopathy, unspecified: Secondary | ICD-10-CM | POA: Diagnosis not present

## 2017-06-05 DIAGNOSIS — I513 Intracardiac thrombosis, not elsewhere classified: Secondary | ICD-10-CM | POA: Diagnosis not present

## 2017-06-05 DIAGNOSIS — Z95810 Presence of automatic (implantable) cardiac defibrillator: Secondary | ICD-10-CM | POA: Diagnosis not present

## 2017-06-05 DIAGNOSIS — I10 Essential (primary) hypertension: Secondary | ICD-10-CM | POA: Diagnosis not present

## 2017-06-05 DIAGNOSIS — R7303 Prediabetes: Secondary | ICD-10-CM | POA: Diagnosis not present

## 2017-06-05 DIAGNOSIS — J449 Chronic obstructive pulmonary disease, unspecified: Secondary | ICD-10-CM | POA: Diagnosis not present

## 2017-06-05 DIAGNOSIS — E785 Hyperlipidemia, unspecified: Secondary | ICD-10-CM | POA: Diagnosis not present

## 2017-06-05 DIAGNOSIS — I509 Heart failure, unspecified: Secondary | ICD-10-CM | POA: Diagnosis not present

## 2017-06-05 DIAGNOSIS — D649 Anemia, unspecified: Secondary | ICD-10-CM | POA: Diagnosis not present

## 2017-06-05 DIAGNOSIS — I2510 Atherosclerotic heart disease of native coronary artery without angina pectoris: Secondary | ICD-10-CM | POA: Diagnosis not present

## 2017-06-07 DIAGNOSIS — J9600 Acute respiratory failure, unspecified whether with hypoxia or hypercapnia: Secondary | ICD-10-CM | POA: Diagnosis not present

## 2017-06-07 DIAGNOSIS — J9621 Acute and chronic respiratory failure with hypoxia: Secondary | ICD-10-CM | POA: Diagnosis not present

## 2017-06-07 DIAGNOSIS — E872 Acidosis: Secondary | ICD-10-CM | POA: Diagnosis not present

## 2017-06-07 DIAGNOSIS — I2510 Atherosclerotic heart disease of native coronary artery without angina pectoris: Secondary | ICD-10-CM | POA: Diagnosis present

## 2017-06-07 DIAGNOSIS — I5023 Acute on chronic systolic (congestive) heart failure: Secondary | ICD-10-CM | POA: Diagnosis present

## 2017-06-07 DIAGNOSIS — E119 Type 2 diabetes mellitus without complications: Secondary | ICD-10-CM | POA: Diagnosis present

## 2017-06-07 DIAGNOSIS — Z95810 Presence of automatic (implantable) cardiac defibrillator: Secondary | ICD-10-CM | POA: Diagnosis not present

## 2017-06-07 DIAGNOSIS — K219 Gastro-esophageal reflux disease without esophagitis: Secondary | ICD-10-CM | POA: Diagnosis present

## 2017-06-07 DIAGNOSIS — Z7952 Long term (current) use of systemic steroids: Secondary | ICD-10-CM | POA: Diagnosis not present

## 2017-06-07 DIAGNOSIS — J9622 Acute and chronic respiratory failure with hypercapnia: Secondary | ICD-10-CM | POA: Diagnosis present

## 2017-06-07 DIAGNOSIS — D649 Anemia, unspecified: Secondary | ICD-10-CM | POA: Diagnosis present

## 2017-06-07 DIAGNOSIS — J441 Chronic obstructive pulmonary disease with (acute) exacerbation: Secondary | ICD-10-CM | POA: Diagnosis not present

## 2017-06-07 DIAGNOSIS — Z9981 Dependence on supplemental oxygen: Secondary | ICD-10-CM | POA: Diagnosis not present

## 2017-06-07 DIAGNOSIS — J449 Chronic obstructive pulmonary disease, unspecified: Secondary | ICD-10-CM | POA: Diagnosis not present

## 2017-06-07 DIAGNOSIS — J9601 Acute respiratory failure with hypoxia: Secondary | ICD-10-CM | POA: Diagnosis not present

## 2017-06-07 DIAGNOSIS — R0902 Hypoxemia: Secondary | ICD-10-CM | POA: Diagnosis not present

## 2017-06-07 DIAGNOSIS — I252 Old myocardial infarction: Secondary | ICD-10-CM | POA: Diagnosis not present

## 2017-06-07 DIAGNOSIS — R0602 Shortness of breath: Secondary | ICD-10-CM | POA: Diagnosis not present

## 2017-06-07 DIAGNOSIS — I509 Heart failure, unspecified: Secondary | ICD-10-CM | POA: Diagnosis not present

## 2017-06-07 DIAGNOSIS — Z79899 Other long term (current) drug therapy: Secondary | ICD-10-CM | POA: Diagnosis not present

## 2017-06-07 DIAGNOSIS — Z87891 Personal history of nicotine dependence: Secondary | ICD-10-CM | POA: Diagnosis not present

## 2017-06-07 DIAGNOSIS — I110 Hypertensive heart disease with heart failure: Secondary | ICD-10-CM | POA: Diagnosis not present

## 2017-06-07 DIAGNOSIS — Z7982 Long term (current) use of aspirin: Secondary | ICD-10-CM | POA: Diagnosis not present

## 2017-06-07 DIAGNOSIS — R069 Unspecified abnormalities of breathing: Secondary | ICD-10-CM | POA: Diagnosis not present

## 2017-06-07 DIAGNOSIS — J9602 Acute respiratory failure with hypercapnia: Secondary | ICD-10-CM | POA: Diagnosis not present

## 2017-06-07 DIAGNOSIS — Z951 Presence of aortocoronary bypass graft: Secondary | ICD-10-CM | POA: Diagnosis not present

## 2017-06-07 DIAGNOSIS — I255 Ischemic cardiomyopathy: Secondary | ICD-10-CM | POA: Diagnosis present

## 2017-06-09 ENCOUNTER — Ambulatory Visit: Payer: Medicare Other | Admitting: Cardiology

## 2017-06-09 DIAGNOSIS — R0602 Shortness of breath: Secondary | ICD-10-CM | POA: Diagnosis not present

## 2017-06-09 DIAGNOSIS — Z95810 Presence of automatic (implantable) cardiac defibrillator: Secondary | ICD-10-CM

## 2017-06-09 DIAGNOSIS — J441 Chronic obstructive pulmonary disease with (acute) exacerbation: Secondary | ICD-10-CM | POA: Diagnosis not present

## 2017-06-09 DIAGNOSIS — J9601 Acute respiratory failure with hypoxia: Secondary | ICD-10-CM | POA: Diagnosis not present

## 2017-06-09 DIAGNOSIS — J9600 Acute respiratory failure, unspecified whether with hypoxia or hypercapnia: Secondary | ICD-10-CM

## 2017-06-09 DIAGNOSIS — J9602 Acute respiratory failure with hypercapnia: Secondary | ICD-10-CM | POA: Diagnosis not present

## 2017-06-10 DIAGNOSIS — J9600 Acute respiratory failure, unspecified whether with hypoxia or hypercapnia: Secondary | ICD-10-CM | POA: Diagnosis not present

## 2017-06-10 DIAGNOSIS — R0602 Shortness of breath: Secondary | ICD-10-CM | POA: Diagnosis not present

## 2017-06-10 DIAGNOSIS — J9602 Acute respiratory failure with hypercapnia: Secondary | ICD-10-CM | POA: Diagnosis not present

## 2017-06-10 DIAGNOSIS — Z95810 Presence of automatic (implantable) cardiac defibrillator: Secondary | ICD-10-CM | POA: Diagnosis not present

## 2017-06-11 DIAGNOSIS — D473 Essential (hemorrhagic) thrombocythemia: Secondary | ICD-10-CM | POA: Diagnosis present

## 2017-06-11 DIAGNOSIS — N390 Urinary tract infection, site not specified: Secondary | ICD-10-CM | POA: Diagnosis present

## 2017-06-11 DIAGNOSIS — J9600 Acute respiratory failure, unspecified whether with hypoxia or hypercapnia: Secondary | ICD-10-CM | POA: Diagnosis not present

## 2017-06-11 DIAGNOSIS — N179 Acute kidney failure, unspecified: Secondary | ICD-10-CM | POA: Diagnosis not present

## 2017-06-11 DIAGNOSIS — Z95810 Presence of automatic (implantable) cardiac defibrillator: Secondary | ICD-10-CM | POA: Diagnosis not present

## 2017-06-11 DIAGNOSIS — Z951 Presence of aortocoronary bypass graft: Secondary | ICD-10-CM | POA: Diagnosis not present

## 2017-06-11 DIAGNOSIS — N281 Cyst of kidney, acquired: Secondary | ICD-10-CM | POA: Diagnosis not present

## 2017-06-11 DIAGNOSIS — E785 Hyperlipidemia, unspecified: Secondary | ICD-10-CM | POA: Diagnosis present

## 2017-06-11 DIAGNOSIS — R5381 Other malaise: Secondary | ICD-10-CM | POA: Diagnosis present

## 2017-06-11 DIAGNOSIS — R000 Tachycardia, unspecified: Secondary | ICD-10-CM | POA: Diagnosis present

## 2017-06-11 DIAGNOSIS — F419 Anxiety disorder, unspecified: Secondary | ICD-10-CM | POA: Diagnosis present

## 2017-06-11 DIAGNOSIS — Z9981 Dependence on supplemental oxygen: Secondary | ICD-10-CM | POA: Diagnosis not present

## 2017-06-11 DIAGNOSIS — I428 Other cardiomyopathies: Secondary | ICD-10-CM | POA: Diagnosis present

## 2017-06-11 DIAGNOSIS — J9602 Acute respiratory failure with hypercapnia: Secondary | ICD-10-CM | POA: Diagnosis not present

## 2017-06-11 DIAGNOSIS — I2510 Atherosclerotic heart disease of native coronary artery without angina pectoris: Secondary | ICD-10-CM | POA: Diagnosis present

## 2017-06-11 DIAGNOSIS — R0602 Shortness of breath: Secondary | ICD-10-CM | POA: Diagnosis not present

## 2017-06-11 DIAGNOSIS — E876 Hypokalemia: Secondary | ICD-10-CM | POA: Diagnosis not present

## 2017-06-11 DIAGNOSIS — K219 Gastro-esophageal reflux disease without esophagitis: Secondary | ICD-10-CM | POA: Diagnosis present

## 2017-06-11 DIAGNOSIS — J9622 Acute and chronic respiratory failure with hypercapnia: Secondary | ICD-10-CM | POA: Diagnosis present

## 2017-06-11 DIAGNOSIS — Z2821 Immunization not carried out because of patient refusal: Secondary | ICD-10-CM | POA: Diagnosis not present

## 2017-06-11 DIAGNOSIS — I5033 Acute on chronic diastolic (congestive) heart failure: Secondary | ICD-10-CM | POA: Diagnosis present

## 2017-06-11 DIAGNOSIS — E119 Type 2 diabetes mellitus without complications: Secondary | ICD-10-CM | POA: Diagnosis present

## 2017-06-11 DIAGNOSIS — J449 Chronic obstructive pulmonary disease, unspecified: Secondary | ICD-10-CM | POA: Diagnosis present

## 2017-06-11 DIAGNOSIS — I110 Hypertensive heart disease with heart failure: Secondary | ICD-10-CM | POA: Diagnosis present

## 2017-06-15 DIAGNOSIS — I130 Hypertensive heart and chronic kidney disease with heart failure and stage 1 through stage 4 chronic kidney disease, or unspecified chronic kidney disease: Secondary | ICD-10-CM | POA: Diagnosis not present

## 2017-06-15 DIAGNOSIS — N183 Chronic kidney disease, stage 3 (moderate): Secondary | ICD-10-CM | POA: Diagnosis not present

## 2017-06-15 DIAGNOSIS — J189 Pneumonia, unspecified organism: Secondary | ICD-10-CM | POA: Diagnosis not present

## 2017-06-15 DIAGNOSIS — E1122 Type 2 diabetes mellitus with diabetic chronic kidney disease: Secondary | ICD-10-CM | POA: Diagnosis not present

## 2017-06-15 DIAGNOSIS — J9611 Chronic respiratory failure with hypoxia: Secondary | ICD-10-CM | POA: Diagnosis not present

## 2017-06-15 DIAGNOSIS — J439 Emphysema, unspecified: Secondary | ICD-10-CM | POA: Diagnosis not present

## 2017-06-16 ENCOUNTER — Institutional Professional Consult (permissible substitution): Payer: Medicare Other | Admitting: Emergency Medicine

## 2017-06-16 DIAGNOSIS — I130 Hypertensive heart and chronic kidney disease with heart failure and stage 1 through stage 4 chronic kidney disease, or unspecified chronic kidney disease: Secondary | ICD-10-CM | POA: Diagnosis not present

## 2017-06-16 DIAGNOSIS — N183 Chronic kidney disease, stage 3 (moderate): Secondary | ICD-10-CM | POA: Diagnosis not present

## 2017-06-16 DIAGNOSIS — J9611 Chronic respiratory failure with hypoxia: Secondary | ICD-10-CM | POA: Diagnosis not present

## 2017-06-16 DIAGNOSIS — E1122 Type 2 diabetes mellitus with diabetic chronic kidney disease: Secondary | ICD-10-CM | POA: Diagnosis not present

## 2017-06-16 DIAGNOSIS — J189 Pneumonia, unspecified organism: Secondary | ICD-10-CM | POA: Diagnosis not present

## 2017-06-16 DIAGNOSIS — J439 Emphysema, unspecified: Secondary | ICD-10-CM | POA: Diagnosis not present

## 2017-06-17 DIAGNOSIS — R7303 Prediabetes: Secondary | ICD-10-CM | POA: Diagnosis not present

## 2017-06-17 DIAGNOSIS — R609 Edema, unspecified: Secondary | ICD-10-CM | POA: Diagnosis not present

## 2017-06-17 DIAGNOSIS — F419 Anxiety disorder, unspecified: Secondary | ICD-10-CM | POA: Diagnosis not present

## 2017-06-17 DIAGNOSIS — I429 Cardiomyopathy, unspecified: Secondary | ICD-10-CM | POA: Diagnosis not present

## 2017-06-17 DIAGNOSIS — I2510 Atherosclerotic heart disease of native coronary artery without angina pectoris: Secondary | ICD-10-CM | POA: Diagnosis not present

## 2017-06-17 DIAGNOSIS — J9621 Acute and chronic respiratory failure with hypoxia: Secondary | ICD-10-CM | POA: Diagnosis not present

## 2017-06-17 DIAGNOSIS — I509 Heart failure, unspecified: Secondary | ICD-10-CM | POA: Diagnosis not present

## 2017-06-17 DIAGNOSIS — E785 Hyperlipidemia, unspecified: Secondary | ICD-10-CM | POA: Diagnosis not present

## 2017-06-17 DIAGNOSIS — I10 Essential (primary) hypertension: Secondary | ICD-10-CM | POA: Diagnosis not present

## 2017-06-17 DIAGNOSIS — Z95810 Presence of automatic (implantable) cardiac defibrillator: Secondary | ICD-10-CM | POA: Diagnosis not present

## 2017-06-17 DIAGNOSIS — J449 Chronic obstructive pulmonary disease, unspecified: Secondary | ICD-10-CM | POA: Diagnosis not present

## 2017-06-17 DIAGNOSIS — I513 Intracardiac thrombosis, not elsewhere classified: Secondary | ICD-10-CM | POA: Diagnosis not present

## 2017-06-18 DIAGNOSIS — J189 Pneumonia, unspecified organism: Secondary | ICD-10-CM | POA: Diagnosis not present

## 2017-06-18 DIAGNOSIS — I130 Hypertensive heart and chronic kidney disease with heart failure and stage 1 through stage 4 chronic kidney disease, or unspecified chronic kidney disease: Secondary | ICD-10-CM | POA: Diagnosis not present

## 2017-06-18 DIAGNOSIS — N183 Chronic kidney disease, stage 3 (moderate): Secondary | ICD-10-CM | POA: Diagnosis not present

## 2017-06-18 DIAGNOSIS — J439 Emphysema, unspecified: Secondary | ICD-10-CM | POA: Diagnosis not present

## 2017-06-18 DIAGNOSIS — E1122 Type 2 diabetes mellitus with diabetic chronic kidney disease: Secondary | ICD-10-CM | POA: Diagnosis not present

## 2017-06-18 DIAGNOSIS — J9611 Chronic respiratory failure with hypoxia: Secondary | ICD-10-CM | POA: Diagnosis not present

## 2017-06-19 ENCOUNTER — Ambulatory Visit (INDEPENDENT_AMBULATORY_CARE_PROVIDER_SITE_OTHER): Payer: Medicare Other | Admitting: Cardiology

## 2017-06-19 ENCOUNTER — Encounter: Payer: Self-pay | Admitting: Cardiology

## 2017-06-19 VITALS — BP 128/62 | HR 80 | Ht 64.0 in | Wt 127.1 lb

## 2017-06-19 DIAGNOSIS — Z951 Presence of aortocoronary bypass graft: Secondary | ICD-10-CM

## 2017-06-19 DIAGNOSIS — I5023 Acute on chronic systolic (congestive) heart failure: Secondary | ICD-10-CM | POA: Diagnosis not present

## 2017-06-19 DIAGNOSIS — J441 Chronic obstructive pulmonary disease with (acute) exacerbation: Secondary | ICD-10-CM | POA: Diagnosis not present

## 2017-06-19 DIAGNOSIS — Z4502 Encounter for adjustment and management of automatic implantable cardiac defibrillator: Secondary | ICD-10-CM | POA: Diagnosis not present

## 2017-06-19 DIAGNOSIS — I255 Ischemic cardiomyopathy: Secondary | ICD-10-CM | POA: Diagnosis not present

## 2017-06-19 DIAGNOSIS — Z95810 Presence of automatic (implantable) cardiac defibrillator: Secondary | ICD-10-CM | POA: Diagnosis not present

## 2017-06-19 DIAGNOSIS — I340 Nonrheumatic mitral (valve) insufficiency: Secondary | ICD-10-CM | POA: Diagnosis not present

## 2017-06-19 NOTE — Progress Notes (Signed)
Cardiology Office Note:    Date:  06/19/2017   ID:  Brandi Gardner, DOB 05/16/1934, MRN [id number]  PCP:  Cyndi Bender, PA-C  Cardiologist:  Jenean Lindau, MD   Referring MD: Cyndi Bender, PA-C    ASSESSMENT:    1. Acute on chronic systolic (congestive) heart failure ([place])   2. Ischemic cardiomyopathy   3. Mitral valve insufficiency, unspecified etiology   4. S/P CABG (coronary artery bypass graft)   5. Cardiac defibrillator in place   6. Chronic obstructive pulmonary disease with acute exacerbation (H[facility])    PLAN:    In order of problems listed above:  1. Secondary prevention stressed with patient.  Importance of compliance with diet and medications stressed and she vocalized understanding.  Her blood pressure stable.  Diet was discussed for dyslipidemia.  Congestive heart failure education was given salt restriction was stressed.  She gets a defibrillator check in [place] and asked her to schedule an appointment in the near future.  Also mentions to her that once we come back in July 2 Kay we will resume her defibrillator check and AP follow-ups.  Her blood pressure stable her blood work from hospital was reviewed and she will be seen in follow-up appointment in the end of June or earlier if she has any concerns.   Medication Adjustments/Labs and Tests Ordered: Current medicines are reviewed at length with the patient today.  Concerns regarding medicines are outlined above.  No orders of the defined types were placed in this encounter.  No orders of the defined types were placed in this encounter.    Chief Complaint  Patient presents with  . Referral    Self     History of Present Illness:    Brandi Dosh is a 83 y.o. female.  Patient has history of advanced cardiomyopathy with an ejection fraction of 15-20% with moderate to severe mitral regurgitation.  She has history of multiple coronary interventions and 2 bypass surgeries in the past.  She has renal  insufficiency and has significant anemia.  She was admitted to the hospital for congestive heart failure.  She is on oxygen 24 hours.  Her family member accompanies her.  She has been here as her primary care physician saw her after discharge and referred her to [place].  At the time of my evaluation she is alert awake oriented and in no distress.  She is stable at low level.  She denies any shortness of breath on her routine at home.  Past Medical History:  Diagnosis Date  . Acute on chronic systolic (congestive) heart failure ([place]) 04/24/2015  . AICD (automatic cardioverter/defibrillator) present   . Anxiety   . Cardiac defibrillator in place 04/25/2015  . CHF (congestive heart failure) ([place])   . Chronic obstructive pulmonary disease with acute exacerbation ([place]) 04/22/2015  . COPD (chronic obstructive pulmonary disease) ([place])   . Coronary artery disease   . Hx of pulmonary embolus 1980  . Hyperlipidemia   . Hypertension   . Ischemic cardiomyopathy 05/24/2015  . S/P CABG (coronary artery bypass graft) 05/24/2015    Past Surgical History:  Procedure Laterality Date  . CARDIAC CATHETERIZATION    . CORONARY ARTERY BYPASS GRAFT    . IR GENERIC HISTORICAL  11/08/2015   IR RADIOLOGIST EVAL & MGMT 11/08/2015 [facility]  . IR GENERIC HISTORICAL  11/13/2015   IR VERTEBROPLASTY LUMBAR BX INC UNI/BIL INC/INJECT/IMAGING 11/13/2015 Luanne Bras, MD [facility]  . IR GENERIC HISTORICAL  12/07/2015   IR  RADIOLOGIST EVAL & MGMT 12/07/2015 [facility]  . TUBAL LIGATION      Current Medications: Current Meds  Medication Sig  . acetaminophen (TYLENOL) 500 MG tablet Take 500 mg by mouth every 6 (six) hours as needed for moderate pain or headache.  . albuterol (PROVENTIL HFA;VENTOLIN HFA) 108 (90 Base) MCG/ACT inhaler Inhale 2 puffs into the lungs every 6 (six) hours as needed for wheezing or shortness of breath.  Marland Kitchen albuterol (PROVENTIL) (2.5 MG/3ML) 0.083% nebulizer solution Take 2.5 mg by nebulization  every 6 (six) hours as needed for wheezing or shortness of breath.  . ALPRAZolam (XANAX) 1 MG tablet TAKE 1 TABLET BY MOUTH TWICE A DAY AS NEEDED FOR ANXIETY  . aspirin EC 81 MG tablet Take 81 mg by mouth daily.  . carvedilol (COREG) 3.125 MG tablet Take 3.125 mg by mouth 2 (two) times daily with a meal.  . cyanocobalamin (,VITAMIN B-12,) 1000 MCG/ML injection INJECT IM ONCE DAILY FOR 5 DAYS THEN ONCE WEEKLY FOR 4 WEEKS THEN INJECT ONCE MONTHLY AS DIRECTED  . ferrous sulfate 324 (65 Fe) MG TBEC Take 1 tablet by mouth daily.  . fluticasone (FLONASE) 50 MCG/ACT nasal spray Place 2 sprays into both nostrils daily.  . fluticasone (FLOVENT HFA) 110 MCG/ACT inhaler Inhale 2 puffs into the lungs daily.  . furosemide (LASIX) 40 MG tablet Take 40 mg by mouth 2 (two) times daily.  . isosorbide mononitrate (IMDUR) 30 MG 24 hr tablet Take 30 mg by mouth daily.  . nitroGLYCERIN (NITROSTAT) 0.4 MG SL tablet Place 0.4 mg under the tongue every 5 (five) minutes as needed for chest pain.  . pantoprazole (PROTONIX) 40 MG tablet Take 40 mg by mouth daily.  Marland Kitchen spironolactone (ALDACTONE) 25 MG tablet Take 25 mg by mouth daily.  Marland Kitchen tiotropium (SPIRIVA) 18 MCG inhalation capsule Place 18 mcg into inhaler and inhale daily.  . [DISCONTINUED] ALPRAZolam (XANAX) 0.5 MG tablet Take 0.5 mg by mouth 2 (two) times daily.  . [DISCONTINUED] lisinopril (PRINIVIL,ZESTRIL) 2.5 MG tablet Take 2.5 mg by mouth daily.  . [DISCONTINUED] loratadine (CLARITIN) 10 MG tablet Take 10 mg by mouth.     Allergies:   Benadryl [diphenhydramine]; Penicillins; Potassium-containing compounds; Percocet [oxycodone-acetaminophen]; Vicodin [hydrocodone-acetaminophen]; Potassium; Statins; and Tramadol   Social History   Socioeconomic History  . Marital status: Widowed    Spouse name: Not on file  . Number of children: Not on file  . Years of education: Not on file  . Highest education level: Not on file  Occupational History  . Not on file    Social Needs  . Financial resource strain: Not on file  . Food insecurity:    Worry: Not on file    Inability: Not on file  . Transportation needs:    Medical: Not on file    Non-medical: Not on file  Tobacco Use  . Smoking status: Former Smoker    Last attempt to quit: 11/12/1992    Years since quitting: 24.6  . Smokeless tobacco: Never Used  Substance and Sexual Activity  . Alcohol use: No  . Drug use: No  . Sexual activity: Not on file  Lifestyle  . Physical activity:    Days per week: Not on file    Minutes per session: Not on file  . Stress: Not on file  Relationships  . Social connections:    Talks on phone: Not on file    Gets together: Not on file    Attends religious service: Not  on file    Active member of club or organization: Not on file    Attends meetings of clubs or organizations: Not on file    Relationship status: Not on file  Other Topics Concern  . Not on file  Social History Narrative  . Not on file     Family History: The patient's family history includes Hypertension in her father and mother; Uterine cancer in her mother.  ROS:   Please see the history of present illness.    All other systems reviewed and are negative.  EKGs/Labs/Other Studies Reviewed:    The following studies were reviewed today: I reviewed hospital records and discussed with the patient including echocardiogram.   Recent Labs: No results found for requested labs within last 8760 hours.  Recent Lipid Panel No results found for: CHOL, TRIG, HDL, CHOLHDL, VLDL, LDLCALC, LDLDIRECT  Physical Exam:    VS:  BP 128/62 (BP Location: Right Arm, Patient Position: Sitting, Cuff Size: Normal)   Pulse 80   Ht 5\' 4"  (1.626 m)   Wt 127 lb 1.9 oz (57.7 kg)   SpO2 96%   BMI 21.82 kg/m     Wt Readings from Last 3 Encounters:  06/19/17 127 lb 1.9 oz (57.7 kg)  11/13/15 123 lb (55.8 kg)     GEN: Patient is in no acute distress HEENT: Normal NECK: No JVD; No carotid  bruits LYMPHATICS: No lymphadenopathy CARDIAC: Hear sounds regular, 2/6 systolic murmur at the apex. RESPIRATORY:  Clear to auscultation without rales, wheezing or rhonchi  ABDOMEN: Soft, non-tender, non-distended MUSCULOSKELETAL:  No edema; No deformity  SKIN: Warm and dry NEUROLOGIC:  Alert and oriented x 3 PSYCHIATRIC:  Normal affect   Signed, Jenean Lindau, MD  06/19/2017 2:10 PM    [facility]

## 2017-06-19 NOTE — Patient Instructions (Signed)
Medication Instructions:  Your physician recommends that you continue on your current medications as directed. Please refer to the Current Medication list given to you today.   Labwork: None  Testing/Procedures: None  Follow-Up: Your physician recommends that you schedule a follow-up appointment in: June   Any Other Special Instructions Will Be Listed Below (If Applicable).     If you need a refill on your cardiac medications before your next appointment, please call your pharmacy.

## 2017-06-22 ENCOUNTER — Telehealth: Payer: Self-pay | Admitting: Cardiology

## 2017-06-22 NOTE — Telephone Encounter (Signed)
Carvedilol called into [facility] in [place] on [address] please

## 2017-06-23 ENCOUNTER — Other Ambulatory Visit: Payer: Self-pay

## 2017-06-23 MED ORDER — CARVEDILOL 3.125 MG PO TABS: 3.1250 mg | ORAL_TABLET | Freq: Two times a day (BID) | ORAL | 0 refills | Status: DC

## 2017-06-23 NOTE — Telephone Encounter (Signed)
Med refill sent

## 2017-06-24 DIAGNOSIS — J9611 Chronic respiratory failure with hypoxia: Secondary | ICD-10-CM | POA: Diagnosis not present

## 2017-06-24 DIAGNOSIS — J439 Emphysema, unspecified: Secondary | ICD-10-CM | POA: Diagnosis not present

## 2017-06-24 DIAGNOSIS — I130 Hypertensive heart and chronic kidney disease with heart failure and stage 1 through stage 4 chronic kidney disease, or unspecified chronic kidney disease: Secondary | ICD-10-CM | POA: Diagnosis not present

## 2017-06-24 DIAGNOSIS — E1122 Type 2 diabetes mellitus with diabetic chronic kidney disease: Secondary | ICD-10-CM | POA: Diagnosis not present

## 2017-06-24 DIAGNOSIS — N183 Chronic kidney disease, stage 3 (moderate): Secondary | ICD-10-CM | POA: Diagnosis not present

## 2017-06-24 DIAGNOSIS — J189 Pneumonia, unspecified organism: Secondary | ICD-10-CM | POA: Diagnosis not present

## 2017-06-25 DIAGNOSIS — J439 Emphysema, unspecified: Secondary | ICD-10-CM | POA: Diagnosis not present

## 2017-06-25 DIAGNOSIS — J9611 Chronic respiratory failure with hypoxia: Secondary | ICD-10-CM | POA: Diagnosis not present

## 2017-06-25 DIAGNOSIS — N183 Chronic kidney disease, stage 3 (moderate): Secondary | ICD-10-CM | POA: Diagnosis not present

## 2017-06-25 DIAGNOSIS — J189 Pneumonia, unspecified organism: Secondary | ICD-10-CM | POA: Diagnosis not present

## 2017-06-25 DIAGNOSIS — E1122 Type 2 diabetes mellitus with diabetic chronic kidney disease: Secondary | ICD-10-CM | POA: Diagnosis not present

## 2017-06-25 DIAGNOSIS — I130 Hypertensive heart and chronic kidney disease with heart failure and stage 1 through stage 4 chronic kidney disease, or unspecified chronic kidney disease: Secondary | ICD-10-CM | POA: Diagnosis not present

## 2017-06-29 DIAGNOSIS — I130 Hypertensive heart and chronic kidney disease with heart failure and stage 1 through stage 4 chronic kidney disease, or unspecified chronic kidney disease: Secondary | ICD-10-CM | POA: Diagnosis not present

## 2017-06-29 DIAGNOSIS — J189 Pneumonia, unspecified organism: Secondary | ICD-10-CM | POA: Diagnosis not present

## 2017-06-29 DIAGNOSIS — E1122 Type 2 diabetes mellitus with diabetic chronic kidney disease: Secondary | ICD-10-CM | POA: Diagnosis not present

## 2017-06-29 DIAGNOSIS — J439 Emphysema, unspecified: Secondary | ICD-10-CM | POA: Diagnosis not present

## 2017-06-29 DIAGNOSIS — J9611 Chronic respiratory failure with hypoxia: Secondary | ICD-10-CM | POA: Diagnosis not present

## 2017-06-29 DIAGNOSIS — N183 Chronic kidney disease, stage 3 (moderate): Secondary | ICD-10-CM | POA: Diagnosis not present

## 2017-07-01 DIAGNOSIS — J189 Pneumonia, unspecified organism: Secondary | ICD-10-CM | POA: Diagnosis not present

## 2017-07-01 DIAGNOSIS — J9611 Chronic respiratory failure with hypoxia: Secondary | ICD-10-CM | POA: Diagnosis not present

## 2017-07-01 DIAGNOSIS — I429 Cardiomyopathy, unspecified: Secondary | ICD-10-CM | POA: Diagnosis not present

## 2017-07-01 DIAGNOSIS — K219 Gastro-esophageal reflux disease without esophagitis: Secondary | ICD-10-CM | POA: Diagnosis not present

## 2017-07-01 DIAGNOSIS — J9621 Acute and chronic respiratory failure with hypoxia: Secondary | ICD-10-CM | POA: Diagnosis not present

## 2017-07-01 DIAGNOSIS — N183 Chronic kidney disease, stage 3 (moderate): Secondary | ICD-10-CM | POA: Diagnosis not present

## 2017-07-01 DIAGNOSIS — I130 Hypertensive heart and chronic kidney disease with heart failure and stage 1 through stage 4 chronic kidney disease, or unspecified chronic kidney disease: Secondary | ICD-10-CM | POA: Diagnosis not present

## 2017-07-01 DIAGNOSIS — J439 Emphysema, unspecified: Secondary | ICD-10-CM | POA: Diagnosis not present

## 2017-07-01 DIAGNOSIS — F419 Anxiety disorder, unspecified: Secondary | ICD-10-CM | POA: Diagnosis not present

## 2017-07-01 DIAGNOSIS — E1122 Type 2 diabetes mellitus with diabetic chronic kidney disease: Secondary | ICD-10-CM | POA: Diagnosis not present

## 2017-07-06 DIAGNOSIS — N183 Chronic kidney disease, stage 3 (moderate): Secondary | ICD-10-CM | POA: Diagnosis not present

## 2017-07-06 DIAGNOSIS — J189 Pneumonia, unspecified organism: Secondary | ICD-10-CM | POA: Diagnosis not present

## 2017-07-06 DIAGNOSIS — J439 Emphysema, unspecified: Secondary | ICD-10-CM | POA: Diagnosis not present

## 2017-07-06 DIAGNOSIS — E1122 Type 2 diabetes mellitus with diabetic chronic kidney disease: Secondary | ICD-10-CM | POA: Diagnosis not present

## 2017-07-06 DIAGNOSIS — I130 Hypertensive heart and chronic kidney disease with heart failure and stage 1 through stage 4 chronic kidney disease, or unspecified chronic kidney disease: Secondary | ICD-10-CM | POA: Diagnosis not present

## 2017-07-06 DIAGNOSIS — J9611 Chronic respiratory failure with hypoxia: Secondary | ICD-10-CM | POA: Diagnosis not present

## 2017-07-16 DIAGNOSIS — H353232 Exudative age-related macular degeneration, bilateral, with inactive choroidal neovascularization: Secondary | ICD-10-CM | POA: Diagnosis not present

## 2017-07-17 DIAGNOSIS — J189 Pneumonia, unspecified organism: Secondary | ICD-10-CM | POA: Diagnosis not present

## 2017-07-17 DIAGNOSIS — N183 Chronic kidney disease, stage 3 (moderate): Secondary | ICD-10-CM | POA: Diagnosis not present

## 2017-07-17 DIAGNOSIS — J9611 Chronic respiratory failure with hypoxia: Secondary | ICD-10-CM | POA: Diagnosis not present

## 2017-07-17 DIAGNOSIS — E1122 Type 2 diabetes mellitus with diabetic chronic kidney disease: Secondary | ICD-10-CM | POA: Diagnosis not present

## 2017-07-17 DIAGNOSIS — J439 Emphysema, unspecified: Secondary | ICD-10-CM | POA: Diagnosis not present

## 2017-07-17 DIAGNOSIS — I130 Hypertensive heart and chronic kidney disease with heart failure and stage 1 through stage 4 chronic kidney disease, or unspecified chronic kidney disease: Secondary | ICD-10-CM | POA: Diagnosis not present

## 2017-07-21 ENCOUNTER — Encounter: Payer: Self-pay | Admitting: Pulmonary Disease

## 2017-07-21 ENCOUNTER — Ambulatory Visit (INDEPENDENT_AMBULATORY_CARE_PROVIDER_SITE_OTHER): Payer: Medicare Other | Admitting: Pulmonary Disease

## 2017-07-21 VITALS — BP 126/70 | HR 76 | Ht 64.5 in | Wt 130.0 lb

## 2017-07-21 DIAGNOSIS — J441 Chronic obstructive pulmonary disease with (acute) exacerbation: Secondary | ICD-10-CM | POA: Diagnosis not present

## 2017-07-21 DIAGNOSIS — J449 Chronic obstructive pulmonary disease, unspecified: Secondary | ICD-10-CM | POA: Diagnosis not present

## 2017-07-21 MED ORDER — FLUTICASONE FUROATE-VILANTEROL 200-25 MCG/INH IN AEPB: 1.0000 | INHALATION_SPRAY | Freq: Every day | RESPIRATORY_TRACT | 4 refills | Status: DC

## 2017-07-21 NOTE — Patient Instructions (Signed)
Continue the Spiriva.  We will stop the Flovent and start Breo 200 Continue with your nebulizer and albuterol as you are doing now Continue supplemental oxygen We will schedule you for pulmonary function test to be done at [place] in the next 3 months Follow-up in clinic in 3 months time.

## 2017-07-21 NOTE — Progress Notes (Addendum)
Brandi Gardner    [id number]    1934-05-01  Primary Care Physician:Conroy, Tamala Julian  Referring Physician: Cher Nakai, MD [place], [place] [address]  Chief complaint: Consult for COPD  HPI: 83 year old with acute on chronic systolic heart failure, ischemic cardiomyopathy (EF 15-20%), AICD placement, severe mitral insufficiency, COPD History noted for multiple hospitalizations at [facility] hospital for pneumonia, respiratory failure.  She has been on supplemental oxygen since April 2019.  Diagnosed with COPD in [place] about years 8 years ago with PFTs.  She has not followed up with a pulmonologist since her move to [place]. Maintained on Spiriva and Flovent.  She also uses pro-air and nebulizers as needed.  Complains of chronic dyspnea on exertion and at rest.  Productive cough with white mucus, wheezing.  Pets: No pets Occupation: Used to work as a Health and safety inspector, book making. Exposures: No known exposures, no mold, hot tub, Jacuzzi, hot tub Smoking history: 40-pack-year smoking.  Quit in 1994 Travel history: Originally from [place].  Lived in [place] for 2 years in the past.  Moved to [place] around 2011. Relevant family history: No significant family history of lung issues.  Outpatient Encounter Medications as of 07/21/2017  Medication Sig  . acetaminophen (TYLENOL) 500 MG tablet Take 500 mg by mouth every 6 (six) hours as needed for moderate pain or headache.  . albuterol (PROVENTIL HFA;VENTOLIN HFA) 108 (90 Base) MCG/ACT inhaler Inhale 2 puffs into the lungs every 6 (six) hours as needed for wheezing or shortness of breath.  Marland Kitchen albuterol (PROVENTIL) (2.5 MG/3ML) 0.083% nebulizer solution Take 2.5 mg by nebulization every 6 (six) hours as needed for wheezing or shortness of breath.  . ALPRAZolam (XANAX) 1 MG tablet TAKE 1 TABLET BY MOUTH TWICE A DAY AS NEEDED FOR ANXIETY  . aspirin EC 81 MG tablet Take 81 mg by mouth daily.  .  carvedilol (COREG) 3.125 MG tablet Take 1 tablet (3.125 mg total) by mouth 2 (two) times daily with a meal.  . cyanocobalamin (,VITAMIN B-12,) 1000 MCG/ML injection INJECT IM ONCE DAILY FOR 5 DAYS THEN ONCE WEEKLY FOR 4 WEEKS THEN INJECT ONCE MONTHLY AS DIRECTED  . ferrous sulfate 324 (65 Fe) MG TBEC Take 1 tablet by mouth daily.  . fluticasone (FLONASE) 50 MCG/ACT nasal spray Place 2 sprays into both nostrils daily.  . fluticasone (FLOVENT HFA) 110 MCG/ACT inhaler Inhale 2 puffs into the lungs daily.  . furosemide (LASIX) 40 MG tablet Take 40 mg by mouth 2 (two) times daily.  . isosorbide mononitrate (IMDUR) 30 MG 24 hr tablet Take 30 mg by mouth daily.  . nitroGLYCERIN (NITROSTAT) 0.4 MG SL tablet Place 0.4 mg under the tongue every 5 (five) minutes as needed for chest pain.  . pantoprazole (PROTONIX) 40 MG tablet Take 40 mg by mouth daily.  Marland Kitchen spironolactone (ALDACTONE) 25 MG tablet Take 25 mg by mouth daily.  Marland Kitchen tiotropium (SPIRIVA) 18 MCG inhalation capsule Place 18 mcg into inhaler and inhale daily.   No facility-administered encounter medications on file as of 07/21/2017.     Allergies as of 07/21/2017 - Review Complete 07/21/2017  Allergen Reaction Noted  . Benadryl [diphenhydramine] Shortness Of Breath and Other (See Comments) 11/13/2015  . Penicillins Other (See Comments) 04/22/2015  . Potassium-containing compounds Other (See Comments) 11/13/2015  . Percocet [oxycodone-acetaminophen] Hives 11/13/2015  . Vicodin [hydrocodone-acetaminophen] Hives 11/13/2015  . Potassium  08/01/2015  . Statins Nausea Only 10/07/2016  .  Tramadol Itching 10/07/2016    Past Medical History:  Diagnosis Date  . Acute on chronic systolic (congestive) heart failure ([place]) 04/24/2015  . AICD (automatic cardioverter/defibrillator) present   . Anxiety   . Cardiac defibrillator in place 04/25/2015  . CHF (congestive heart failure) ([place])   . Chronic obstructive pulmonary disease with acute exacerbation ([place])  04/22/2015  . COPD (chronic obstructive pulmonary disease) ([place])   . Coronary artery disease   . Hx of pulmonary embolus 1980  . Hyperlipidemia   . Hypertension   . Ischemic cardiomyopathy 05/24/2015  . S/P CABG (coronary artery bypass graft) 05/24/2015    Past Surgical History:  Procedure Laterality Date  . CARDIAC CATHETERIZATION    . CORONARY ARTERY BYPASS GRAFT    . IR GENERIC HISTORICAL  11/08/2015   IR RADIOLOGIST EVAL & MGMT 11/08/2015 [facility]  . IR GENERIC HISTORICAL  11/13/2015   IR VERTEBROPLASTY LUMBAR BX INC UNI/BIL INC/INJECT/IMAGING 11/13/2015 Luanne Bras, MD [facility]  . IR GENERIC HISTORICAL  12/07/2015   IR RADIOLOGIST EVAL & MGMT 12/07/2015 [facility]  . TUBAL LIGATION      Family History  Problem Relation Age of Onset  . Hypertension Mother   . Uterine cancer Mother   . Hypertension Father     Social History   Socioeconomic History  . Marital status: Widowed    Spouse name: Not on file  . Number of children: Not on file  . Years of education: Not on file  . Highest education level: Not on file  Occupational History  . Not on file  Social Needs  . Financial resource strain: Not on file  . Food insecurity:    Worry: Not on file    Inability: Not on file  . Transportation needs:    Medical: Not on file    Non-medical: Not on file  Tobacco Use  . Smoking status: Former Smoker    Last attempt to quit: 11/12/1992    Years since quitting: 24.7  . Smokeless tobacco: Never Used  Substance and Sexual Activity  . Alcohol use: No  . Drug use: No  . Sexual activity: Not on file  Lifestyle  . Physical activity:    Days per week: Not on file    Minutes per session: Not on file  . Stress: Not on file  Relationships  . Social connections:    Talks on phone: Not on file    Gets together: Not on file    Attends religious service: Not on file    Active member of club or organization: Not on file    Attends meetings of clubs or organizations:  Not on file    Relationship status: Not on file  . Intimate partner violence:    Fear of current or ex partner: Not on file    Emotionally abused: Not on file    Physically abused: Not on file    Forced sexual activity: Not on file  Other Topics Concern  . Not on file  Social History Narrative  . Not on file    Review of systems: Review of Systems  Constitutional: Negative for fever and chills.  HENT: Negative.   Eyes: Negative for blurred vision.  Respiratory: as per HPI  Cardiovascular: Negative for chest pain and palpitations.  Gastrointestinal: Negative for vomiting, diarrhea, blood per rectum. Genitourinary: Negative for dysuria, urgency, frequency and hematuria.  Musculoskeletal: Negative for myalgias, back pain and joint pain.  Skin: Negative for itching and rash.  Neurological: Negative for dizziness, tremors, focal weakness, seizures and loss of consciousness.  Endo/Heme/Allergies: Negative for environmental allergies.  Psychiatric/Behavioral: Negative for depression, suicidal ideas and hallucinations.  All other systems reviewed and are negative.  Physical Exam: Blood pressure 126/70, pulse 76, height 5' 4.5" (1.638 m), weight 130 lb (59 kg), SpO2 99 %. Gen:      No acute distress HEENT:  EOMI, sclera anicteric Neck:     No masses; no thyromegaly Lungs:    Clear to auscultation bilaterally; normal respiratory effort CV:         Regular rate and rhythm; no murmurs Abd:      + bowel sounds; soft, non-tender; no palpable masses, no distension Ext:    No edema; adequate peripheral perfusion Skin:      Warm and dry; no rash Neuro: alert and oriented x 3 Psych: normal mood and affect  Data Reviewed: CT chest 05/11/2017- bilateral groundglass opacity, bilateral pleural effusion. CT chest 04/05/2017- groundglass opacity right greater than left, small pleural effusion. I have reviewed the images personally.  Labs from primary care 05/25/2017 CBC-WBC 9.9, hemoglobin 10.3,  platelets 236, eos 0% Hepatic panel within normal limits.  Assessment:  Evaluation for emphysema, COPD She has history of COPD with emphysematous changes on CT scan.  We will stop the Flovent and started on Breo which will help her better with COPD.  Continue Spiriva and albuterol as needed Scheduled for pulmonary function test.  She wants this to be done at [facility] which is closer to home. Continue supplemental oxygen  Abnormal CT scan I reviewed her CT scans dating back to 2016.  She has groundglass opacities with small pleural effusions which is consistent with a history of severe congestive heart failure, mitral insufficiency.  There is no clear evidence of underlying interstitial lung disease. While most of the groundglass opacities appear to be fleeting, there was persistent right upper lobe ground glass lesion which needs to be monitored.  She will need a follow-up CT in 1 year to make sure this is not enlarging.  Health maintenance March 2016-Pneumovax June 2016-Prevnar 13  Plan/Recommendations: - Stop Flovent, start Breo 200 - Continue Spiriva, nebulizer and albuterol as needed - Continue supplemental oxygen - CT chest in 1 year.  Marshell Garfinkel MD [facility] 07/21/2017, 12:18 PM  CC: Cher Nakai, MD

## 2017-07-24 ENCOUNTER — Inpatient Hospital Stay (HOSPITAL_COMMUNITY)
Admission: AD | Admit: 2017-07-24 | Payer: Medicare Other | Source: Other Acute Inpatient Hospital | Admitting: Pulmonary Disease

## 2017-07-24 DIAGNOSIS — J9621 Acute and chronic respiratory failure with hypoxia: Secondary | ICD-10-CM | POA: Diagnosis not present

## 2017-07-24 DIAGNOSIS — Z87891 Personal history of nicotine dependence: Secondary | ICD-10-CM | POA: Diagnosis not present

## 2017-07-24 DIAGNOSIS — R0682 Tachypnea, not elsewhere classified: Secondary | ICD-10-CM | POA: Diagnosis not present

## 2017-07-24 DIAGNOSIS — I2510 Atherosclerotic heart disease of native coronary artery without angina pectoris: Secondary | ICD-10-CM | POA: Diagnosis not present

## 2017-07-24 DIAGNOSIS — I469 Cardiac arrest, cause unspecified: Secondary | ICD-10-CM | POA: Diagnosis not present

## 2017-07-24 DIAGNOSIS — I468 Cardiac arrest due to other underlying condition: Secondary | ICD-10-CM | POA: Diagnosis not present

## 2017-07-24 DIAGNOSIS — Z951 Presence of aortocoronary bypass graft: Secondary | ICD-10-CM | POA: Diagnosis not present

## 2017-07-24 DIAGNOSIS — J9622 Acute and chronic respiratory failure with hypercapnia: Secondary | ICD-10-CM | POA: Diagnosis not present

## 2017-07-24 DIAGNOSIS — J189 Pneumonia, unspecified organism: Secondary | ICD-10-CM | POA: Diagnosis not present

## 2017-07-24 DIAGNOSIS — I110 Hypertensive heart disease with heart failure: Secondary | ICD-10-CM | POA: Diagnosis not present

## 2017-07-24 DIAGNOSIS — R140 Abdominal distension (gaseous): Secondary | ICD-10-CM | POA: Diagnosis not present

## 2017-07-24 DIAGNOSIS — I509 Heart failure, unspecified: Secondary | ICD-10-CM | POA: Diagnosis not present

## 2017-07-24 DIAGNOSIS — A419 Sepsis, unspecified organism: Secondary | ICD-10-CM | POA: Diagnosis not present

## 2017-07-24 DIAGNOSIS — R4182 Altered mental status, unspecified: Secondary | ICD-10-CM | POA: Diagnosis not present

## 2017-07-24 DIAGNOSIS — J449 Chronic obstructive pulmonary disease, unspecified: Secondary | ICD-10-CM | POA: Diagnosis not present

## 2017-07-25 DIAGNOSIS — Z4682 Encounter for fitting and adjustment of non-vascular catheter: Secondary | ICD-10-CM | POA: Diagnosis not present

## 2017-07-25 DIAGNOSIS — I517 Cardiomegaly: Secondary | ICD-10-CM | POA: Diagnosis not present

## 2017-07-25 DIAGNOSIS — J9601 Acute respiratory failure with hypoxia: Secondary | ICD-10-CM | POA: Diagnosis not present

## 2017-07-25 DIAGNOSIS — J9602 Acute respiratory failure with hypercapnia: Secondary | ICD-10-CM | POA: Diagnosis not present

## 2017-07-25 DIAGNOSIS — J90 Pleural effusion, not elsewhere classified: Secondary | ICD-10-CM | POA: Diagnosis not present

## 2017-07-25 DIAGNOSIS — R0989 Other specified symptoms and signs involving the circulatory and respiratory systems: Secondary | ICD-10-CM | POA: Diagnosis not present

## 2017-07-25 DIAGNOSIS — J441 Chronic obstructive pulmonary disease with (acute) exacerbation: Secondary | ICD-10-CM | POA: Diagnosis not present

## 2017-07-25 DIAGNOSIS — I469 Cardiac arrest, cause unspecified: Secondary | ICD-10-CM | POA: Diagnosis not present

## 2017-07-26 DIAGNOSIS — I071 Rheumatic tricuspid insufficiency: Secondary | ICD-10-CM | POA: Diagnosis not present

## 2017-07-26 DIAGNOSIS — I517 Cardiomegaly: Secondary | ICD-10-CM | POA: Diagnosis not present

## 2017-07-26 DIAGNOSIS — I272 Pulmonary hypertension, unspecified: Secondary | ICD-10-CM | POA: Diagnosis not present

## 2017-07-26 DIAGNOSIS — I5189 Other ill-defined heart diseases: Secondary | ICD-10-CM | POA: Diagnosis not present

## 2017-07-26 DIAGNOSIS — I519 Heart disease, unspecified: Secondary | ICD-10-CM | POA: Diagnosis not present

## 2017-07-26 DIAGNOSIS — I340 Nonrheumatic mitral (valve) insufficiency: Secondary | ICD-10-CM | POA: Diagnosis not present

## 2017-07-26 DIAGNOSIS — J9622 Acute and chronic respiratory failure with hypercapnia: Secondary | ICD-10-CM | POA: Diagnosis not present

## 2017-07-26 DIAGNOSIS — I059 Rheumatic mitral valve disease, unspecified: Secondary | ICD-10-CM | POA: Diagnosis not present

## 2017-07-26 DIAGNOSIS — J9621 Acute and chronic respiratory failure with hypoxia: Secondary | ICD-10-CM | POA: Diagnosis not present

## 2017-07-26 DIAGNOSIS — J441 Chronic obstructive pulmonary disease with (acute) exacerbation: Secondary | ICD-10-CM | POA: Diagnosis not present

## 2017-07-27 DIAGNOSIS — J9622 Acute and chronic respiratory failure with hypercapnia: Secondary | ICD-10-CM | POA: Diagnosis not present

## 2017-07-27 DIAGNOSIS — J9602 Acute respiratory failure with hypercapnia: Secondary | ICD-10-CM | POA: Diagnosis not present

## 2017-07-27 DIAGNOSIS — R0603 Acute respiratory distress: Secondary | ICD-10-CM | POA: Diagnosis not present

## 2017-07-27 DIAGNOSIS — J9601 Acute respiratory failure with hypoxia: Secondary | ICD-10-CM | POA: Diagnosis not present

## 2017-07-27 DIAGNOSIS — R918 Other nonspecific abnormal finding of lung field: Secondary | ICD-10-CM | POA: Diagnosis not present

## 2017-07-27 DIAGNOSIS — J811 Chronic pulmonary edema: Secondary | ICD-10-CM | POA: Diagnosis not present

## 2017-07-27 DIAGNOSIS — J156 Pneumonia due to other aerobic Gram-negative bacteria: Secondary | ICD-10-CM | POA: Diagnosis not present

## 2017-07-27 DIAGNOSIS — J441 Chronic obstructive pulmonary disease with (acute) exacerbation: Secondary | ICD-10-CM | POA: Diagnosis not present

## 2017-07-27 DIAGNOSIS — J90 Pleural effusion, not elsewhere classified: Secondary | ICD-10-CM | POA: Diagnosis not present

## 2017-07-27 DIAGNOSIS — J9621 Acute and chronic respiratory failure with hypoxia: Secondary | ICD-10-CM | POA: Diagnosis not present

## 2017-07-28 DIAGNOSIS — J9602 Acute respiratory failure with hypercapnia: Secondary | ICD-10-CM | POA: Diagnosis not present

## 2017-07-28 DIAGNOSIS — J156 Pneumonia due to other aerobic Gram-negative bacteria: Secondary | ICD-10-CM | POA: Diagnosis not present

## 2017-07-28 DIAGNOSIS — J9622 Acute and chronic respiratory failure with hypercapnia: Secondary | ICD-10-CM | POA: Diagnosis not present

## 2017-07-28 DIAGNOSIS — J441 Chronic obstructive pulmonary disease with (acute) exacerbation: Secondary | ICD-10-CM | POA: Diagnosis not present

## 2017-07-28 DIAGNOSIS — J9601 Acute respiratory failure with hypoxia: Secondary | ICD-10-CM | POA: Diagnosis not present

## 2017-07-28 DIAGNOSIS — J9621 Acute and chronic respiratory failure with hypoxia: Secondary | ICD-10-CM | POA: Diagnosis not present

## 2017-07-29 DIAGNOSIS — J9811 Atelectasis: Secondary | ICD-10-CM | POA: Diagnosis not present

## 2017-07-29 DIAGNOSIS — R918 Other nonspecific abnormal finding of lung field: Secondary | ICD-10-CM | POA: Diagnosis not present

## 2017-07-29 DIAGNOSIS — J9602 Acute respiratory failure with hypercapnia: Secondary | ICD-10-CM | POA: Diagnosis not present

## 2017-07-29 DIAGNOSIS — J984 Other disorders of lung: Secondary | ICD-10-CM | POA: Diagnosis not present

## 2017-07-29 DIAGNOSIS — J90 Pleural effusion, not elsewhere classified: Secondary | ICD-10-CM | POA: Diagnosis not present

## 2017-07-29 DIAGNOSIS — J9601 Acute respiratory failure with hypoxia: Secondary | ICD-10-CM | POA: Diagnosis not present

## 2017-07-30 DIAGNOSIS — J9602 Acute respiratory failure with hypercapnia: Secondary | ICD-10-CM | POA: Diagnosis not present

## 2017-07-30 DIAGNOSIS — J9601 Acute respiratory failure with hypoxia: Secondary | ICD-10-CM | POA: Diagnosis not present

## 2017-07-30 DIAGNOSIS — I248 Other forms of acute ischemic heart disease: Secondary | ICD-10-CM | POA: Diagnosis not present

## 2017-07-31 DIAGNOSIS — J9601 Acute respiratory failure with hypoxia: Secondary | ICD-10-CM | POA: Diagnosis not present

## 2017-07-31 DIAGNOSIS — J9602 Acute respiratory failure with hypercapnia: Secondary | ICD-10-CM | POA: Diagnosis not present

## 2017-08-01 DIAGNOSIS — Z7189 Other specified counseling: Secondary | ICD-10-CM | POA: Diagnosis not present

## 2017-08-01 DIAGNOSIS — I5021 Acute systolic (congestive) heart failure: Secondary | ICD-10-CM | POA: Diagnosis not present

## 2017-08-01 DIAGNOSIS — F05 Delirium due to known physiological condition: Secondary | ICD-10-CM | POA: Diagnosis not present

## 2017-08-01 DIAGNOSIS — G9341 Metabolic encephalopathy: Secondary | ICD-10-CM | POA: Diagnosis not present

## 2017-08-01 DIAGNOSIS — J9622 Acute and chronic respiratory failure with hypercapnia: Secondary | ICD-10-CM | POA: Diagnosis not present

## 2017-08-01 DIAGNOSIS — J441 Chronic obstructive pulmonary disease with (acute) exacerbation: Secondary | ICD-10-CM | POA: Diagnosis not present

## 2017-08-01 DIAGNOSIS — K5901 Slow transit constipation: Secondary | ICD-10-CM | POA: Diagnosis not present

## 2017-08-01 DIAGNOSIS — J9621 Acute and chronic respiratory failure with hypoxia: Secondary | ICD-10-CM | POA: Diagnosis not present

## 2017-08-01 DIAGNOSIS — Z515 Encounter for palliative care: Secondary | ICD-10-CM | POA: Diagnosis not present

## 2017-08-01 DIAGNOSIS — Z79899 Other long term (current) drug therapy: Secondary | ICD-10-CM | POA: Diagnosis not present

## 2017-08-01 DIAGNOSIS — J9601 Acute respiratory failure with hypoxia: Secondary | ICD-10-CM | POA: Diagnosis not present

## 2017-08-01 DIAGNOSIS — I429 Cardiomyopathy, unspecified: Secondary | ICD-10-CM | POA: Diagnosis not present

## 2017-08-01 DIAGNOSIS — J9602 Acute respiratory failure with hypercapnia: Secondary | ICD-10-CM | POA: Diagnosis not present

## 2017-08-01 DIAGNOSIS — R0789 Other chest pain: Secondary | ICD-10-CM | POA: Diagnosis not present

## 2017-08-01 DIAGNOSIS — N390 Urinary tract infection, site not specified: Secondary | ICD-10-CM | POA: Diagnosis not present

## 2017-08-01 DIAGNOSIS — J156 Pneumonia due to other aerobic Gram-negative bacteria: Secondary | ICD-10-CM | POA: Diagnosis not present

## 2017-08-02 DIAGNOSIS — F05 Delirium due to known physiological condition: Secondary | ICD-10-CM | POA: Diagnosis not present

## 2017-08-02 DIAGNOSIS — J9601 Acute respiratory failure with hypoxia: Secondary | ICD-10-CM | POA: Diagnosis not present

## 2017-08-02 DIAGNOSIS — J441 Chronic obstructive pulmonary disease with (acute) exacerbation: Secondary | ICD-10-CM | POA: Diagnosis not present

## 2017-08-02 DIAGNOSIS — G9341 Metabolic encephalopathy: Secondary | ICD-10-CM | POA: Diagnosis not present

## 2017-08-02 DIAGNOSIS — J9622 Acute and chronic respiratory failure with hypercapnia: Secondary | ICD-10-CM | POA: Diagnosis not present

## 2017-08-02 DIAGNOSIS — I5021 Acute systolic (congestive) heart failure: Secondary | ICD-10-CM | POA: Diagnosis not present

## 2017-08-02 DIAGNOSIS — I429 Cardiomyopathy, unspecified: Secondary | ICD-10-CM | POA: Diagnosis not present

## 2017-08-02 DIAGNOSIS — J156 Pneumonia due to other aerobic Gram-negative bacteria: Secondary | ICD-10-CM | POA: Diagnosis not present

## 2017-08-02 DIAGNOSIS — Z79899 Other long term (current) drug therapy: Secondary | ICD-10-CM | POA: Diagnosis not present

## 2017-08-02 DIAGNOSIS — J9602 Acute respiratory failure with hypercapnia: Secondary | ICD-10-CM | POA: Diagnosis not present

## 2017-08-02 DIAGNOSIS — J9621 Acute and chronic respiratory failure with hypoxia: Secondary | ICD-10-CM | POA: Diagnosis not present

## 2017-08-02 DIAGNOSIS — R0789 Other chest pain: Secondary | ICD-10-CM | POA: Diagnosis not present

## 2017-08-02 DIAGNOSIS — Z515 Encounter for palliative care: Secondary | ICD-10-CM | POA: Diagnosis not present

## 2017-08-02 DIAGNOSIS — Z7189 Other specified counseling: Secondary | ICD-10-CM | POA: Diagnosis not present

## 2017-08-02 DIAGNOSIS — N390 Urinary tract infection, site not specified: Secondary | ICD-10-CM | POA: Diagnosis not present

## 2017-08-02 DIAGNOSIS — K5901 Slow transit constipation: Secondary | ICD-10-CM | POA: Diagnosis not present

## 2017-08-03 DIAGNOSIS — Z515 Encounter for palliative care: Secondary | ICD-10-CM | POA: Diagnosis not present

## 2017-08-03 DIAGNOSIS — K5901 Slow transit constipation: Secondary | ICD-10-CM | POA: Diagnosis not present

## 2017-08-03 DIAGNOSIS — R0789 Other chest pain: Secondary | ICD-10-CM | POA: Diagnosis not present

## 2017-08-03 DIAGNOSIS — J9622 Acute and chronic respiratory failure with hypercapnia: Secondary | ICD-10-CM | POA: Diagnosis not present

## 2017-08-03 DIAGNOSIS — Z7189 Other specified counseling: Secondary | ICD-10-CM | POA: Diagnosis not present

## 2017-08-03 DIAGNOSIS — J9621 Acute and chronic respiratory failure with hypoxia: Secondary | ICD-10-CM | POA: Diagnosis not present

## 2017-08-03 DIAGNOSIS — I429 Cardiomyopathy, unspecified: Secondary | ICD-10-CM | POA: Diagnosis not present

## 2017-08-03 DIAGNOSIS — Z79899 Other long term (current) drug therapy: Secondary | ICD-10-CM | POA: Diagnosis not present

## 2017-08-03 DIAGNOSIS — J156 Pneumonia due to other aerobic Gram-negative bacteria: Secondary | ICD-10-CM | POA: Diagnosis not present

## 2017-08-03 DIAGNOSIS — G9341 Metabolic encephalopathy: Secondary | ICD-10-CM | POA: Diagnosis not present

## 2017-08-03 DIAGNOSIS — J441 Chronic obstructive pulmonary disease with (acute) exacerbation: Secondary | ICD-10-CM | POA: Diagnosis not present

## 2017-08-03 DIAGNOSIS — J9601 Acute respiratory failure with hypoxia: Secondary | ICD-10-CM | POA: Diagnosis not present

## 2017-08-03 DIAGNOSIS — J9602 Acute respiratory failure with hypercapnia: Secondary | ICD-10-CM | POA: Diagnosis not present

## 2017-08-03 DIAGNOSIS — F05 Delirium due to known physiological condition: Secondary | ICD-10-CM | POA: Diagnosis not present

## 2017-08-04 DIAGNOSIS — N3000 Acute cystitis without hematuria: Secondary | ICD-10-CM | POA: Diagnosis not present

## 2017-08-04 DIAGNOSIS — I420 Dilated cardiomyopathy: Secondary | ICD-10-CM | POA: Diagnosis not present

## 2017-08-04 DIAGNOSIS — J441 Chronic obstructive pulmonary disease with (acute) exacerbation: Secondary | ICD-10-CM | POA: Diagnosis not present

## 2017-08-04 DIAGNOSIS — I5021 Acute systolic (congestive) heart failure: Secondary | ICD-10-CM | POA: Diagnosis not present

## 2017-08-04 DIAGNOSIS — I2510 Atherosclerotic heart disease of native coronary artery without angina pectoris: Secondary | ICD-10-CM | POA: Diagnosis not present

## 2017-08-04 DIAGNOSIS — A498 Other bacterial infections of unspecified site: Secondary | ICD-10-CM | POA: Diagnosis not present

## 2017-08-04 DIAGNOSIS — J9601 Acute respiratory failure with hypoxia: Secondary | ICD-10-CM | POA: Diagnosis not present

## 2017-08-04 DIAGNOSIS — N182 Chronic kidney disease, stage 2 (mild): Secondary | ICD-10-CM | POA: Diagnosis not present

## 2017-08-04 DIAGNOSIS — G9341 Metabolic encephalopathy: Secondary | ICD-10-CM | POA: Diagnosis not present

## 2017-08-04 DIAGNOSIS — J9602 Acute respiratory failure with hypercapnia: Secondary | ICD-10-CM | POA: Diagnosis not present

## 2017-08-05 DIAGNOSIS — N3000 Acute cystitis without hematuria: Secondary | ICD-10-CM | POA: Diagnosis not present

## 2017-08-05 DIAGNOSIS — I5021 Acute systolic (congestive) heart failure: Secondary | ICD-10-CM | POA: Diagnosis not present

## 2017-08-05 DIAGNOSIS — A498 Other bacterial infections of unspecified site: Secondary | ICD-10-CM | POA: Diagnosis not present

## 2017-08-05 DIAGNOSIS — G9341 Metabolic encephalopathy: Secondary | ICD-10-CM | POA: Diagnosis not present

## 2017-08-05 DIAGNOSIS — J9601 Acute respiratory failure with hypoxia: Secondary | ICD-10-CM | POA: Diagnosis not present

## 2017-08-05 DIAGNOSIS — J441 Chronic obstructive pulmonary disease with (acute) exacerbation: Secondary | ICD-10-CM | POA: Diagnosis not present

## 2017-08-05 DIAGNOSIS — I2510 Atherosclerotic heart disease of native coronary artery without angina pectoris: Secondary | ICD-10-CM | POA: Diagnosis not present

## 2017-08-05 DIAGNOSIS — I420 Dilated cardiomyopathy: Secondary | ICD-10-CM | POA: Diagnosis not present

## 2017-08-05 DIAGNOSIS — J9602 Acute respiratory failure with hypercapnia: Secondary | ICD-10-CM | POA: Diagnosis not present

## 2017-08-05 DIAGNOSIS — N182 Chronic kidney disease, stage 2 (mild): Secondary | ICD-10-CM | POA: Diagnosis not present

## 2017-08-06 DIAGNOSIS — N3000 Acute cystitis without hematuria: Secondary | ICD-10-CM | POA: Diagnosis not present

## 2017-08-06 DIAGNOSIS — I420 Dilated cardiomyopathy: Secondary | ICD-10-CM | POA: Diagnosis not present

## 2017-08-06 DIAGNOSIS — I2510 Atherosclerotic heart disease of native coronary artery without angina pectoris: Secondary | ICD-10-CM | POA: Diagnosis not present

## 2017-08-06 DIAGNOSIS — N182 Chronic kidney disease, stage 2 (mild): Secondary | ICD-10-CM | POA: Diagnosis not present

## 2017-08-06 DIAGNOSIS — A498 Other bacterial infections of unspecified site: Secondary | ICD-10-CM | POA: Diagnosis not present

## 2017-08-06 DIAGNOSIS — J441 Chronic obstructive pulmonary disease with (acute) exacerbation: Secondary | ICD-10-CM | POA: Diagnosis not present

## 2017-08-06 DIAGNOSIS — J9601 Acute respiratory failure with hypoxia: Secondary | ICD-10-CM | POA: Diagnosis not present

## 2017-08-06 DIAGNOSIS — I5021 Acute systolic (congestive) heart failure: Secondary | ICD-10-CM | POA: Diagnosis not present

## 2017-08-06 DIAGNOSIS — G9341 Metabolic encephalopathy: Secondary | ICD-10-CM | POA: Diagnosis not present

## 2017-08-06 DIAGNOSIS — J9602 Acute respiratory failure with hypercapnia: Secondary | ICD-10-CM | POA: Diagnosis not present

## 2017-08-07 DIAGNOSIS — N3000 Acute cystitis without hematuria: Secondary | ICD-10-CM | POA: Diagnosis not present

## 2017-08-07 DIAGNOSIS — I5022 Chronic systolic (congestive) heart failure: Secondary | ICD-10-CM | POA: Diagnosis not present

## 2017-08-07 DIAGNOSIS — G8929 Other chronic pain: Secondary | ICD-10-CM | POA: Diagnosis not present

## 2017-08-07 DIAGNOSIS — I950 Idiopathic hypotension: Secondary | ICD-10-CM | POA: Diagnosis not present

## 2017-08-07 DIAGNOSIS — F419 Anxiety disorder, unspecified: Secondary | ICD-10-CM | POA: Diagnosis not present

## 2017-08-07 DIAGNOSIS — J9621 Acute and chronic respiratory failure with hypoxia: Secondary | ICD-10-CM | POA: Diagnosis not present

## 2017-08-07 DIAGNOSIS — M6281 Muscle weakness (generalized): Secondary | ICD-10-CM | POA: Diagnosis not present

## 2017-08-07 DIAGNOSIS — N182 Chronic kidney disease, stage 2 (mild): Secondary | ICD-10-CM | POA: Diagnosis not present

## 2017-08-07 DIAGNOSIS — J9601 Acute respiratory failure with hypoxia: Secondary | ICD-10-CM | POA: Diagnosis not present

## 2017-08-07 DIAGNOSIS — I2510 Atherosclerotic heart disease of native coronary artery without angina pectoris: Secondary | ICD-10-CM | POA: Diagnosis not present

## 2017-08-07 DIAGNOSIS — J9602 Acute respiratory failure with hypercapnia: Secondary | ICD-10-CM | POA: Diagnosis not present

## 2017-08-07 DIAGNOSIS — J441 Chronic obstructive pulmonary disease with (acute) exacerbation: Secondary | ICD-10-CM | POA: Diagnosis not present

## 2017-08-07 DIAGNOSIS — R2689 Other abnormalities of gait and mobility: Secondary | ICD-10-CM | POA: Diagnosis not present

## 2017-08-07 DIAGNOSIS — I272 Pulmonary hypertension, unspecified: Secondary | ICD-10-CM | POA: Diagnosis not present

## 2017-08-07 DIAGNOSIS — I5021 Acute systolic (congestive) heart failure: Secondary | ICD-10-CM | POA: Diagnosis not present

## 2017-08-07 DIAGNOSIS — R262 Difficulty in walking, not elsewhere classified: Secondary | ICD-10-CM | POA: Diagnosis not present

## 2017-08-07 DIAGNOSIS — Z743 Need for continuous supervision: Secondary | ICD-10-CM | POA: Diagnosis not present

## 2017-08-07 DIAGNOSIS — I429 Cardiomyopathy, unspecified: Secondary | ICD-10-CM | POA: Diagnosis not present

## 2017-08-07 DIAGNOSIS — R05 Cough: Secondary | ICD-10-CM | POA: Diagnosis not present

## 2017-08-07 DIAGNOSIS — A498 Other bacterial infections of unspecified site: Secondary | ICD-10-CM | POA: Diagnosis not present

## 2017-08-07 DIAGNOSIS — J449 Chronic obstructive pulmonary disease, unspecified: Secondary | ICD-10-CM | POA: Diagnosis not present

## 2017-08-07 DIAGNOSIS — D649 Anemia, unspecified: Secondary | ICD-10-CM | POA: Diagnosis not present

## 2017-08-07 DIAGNOSIS — G9341 Metabolic encephalopathy: Secondary | ICD-10-CM | POA: Diagnosis not present

## 2017-08-07 DIAGNOSIS — I255 Ischemic cardiomyopathy: Secondary | ICD-10-CM | POA: Diagnosis not present

## 2017-08-07 DIAGNOSIS — R300 Dysuria: Secondary | ICD-10-CM | POA: Diagnosis not present

## 2017-08-07 DIAGNOSIS — I10 Essential (primary) hypertension: Secondary | ICD-10-CM | POA: Diagnosis not present

## 2017-08-07 DIAGNOSIS — I420 Dilated cardiomyopathy: Secondary | ICD-10-CM | POA: Diagnosis not present

## 2017-08-07 DIAGNOSIS — N183 Chronic kidney disease, stage 3 (moderate): Secondary | ICD-10-CM | POA: Diagnosis not present

## 2017-08-07 DIAGNOSIS — J431 Panlobular emphysema: Secondary | ICD-10-CM | POA: Diagnosis not present

## 2017-08-07 DIAGNOSIS — Z9181 History of falling: Secondary | ICD-10-CM | POA: Diagnosis not present

## 2017-08-12 DIAGNOSIS — R300 Dysuria: Secondary | ICD-10-CM | POA: Diagnosis not present

## 2017-08-12 DIAGNOSIS — I5022 Chronic systolic (congestive) heart failure: Secondary | ICD-10-CM | POA: Diagnosis not present

## 2017-08-12 DIAGNOSIS — R05 Cough: Secondary | ICD-10-CM | POA: Diagnosis not present

## 2017-08-12 DIAGNOSIS — J449 Chronic obstructive pulmonary disease, unspecified: Secondary | ICD-10-CM | POA: Diagnosis not present

## 2017-08-19 DIAGNOSIS — I5022 Chronic systolic (congestive) heart failure: Secondary | ICD-10-CM | POA: Diagnosis not present

## 2017-08-19 DIAGNOSIS — D649 Anemia, unspecified: Secondary | ICD-10-CM | POA: Diagnosis not present

## 2017-08-20 ENCOUNTER — Ambulatory Visit: Payer: Medicare Other | Admitting: Cardiology

## 2017-08-21 DIAGNOSIS — J449 Chronic obstructive pulmonary disease, unspecified: Secondary | ICD-10-CM | POA: Diagnosis not present

## 2017-08-21 DIAGNOSIS — I5022 Chronic systolic (congestive) heart failure: Secondary | ICD-10-CM | POA: Diagnosis not present

## 2017-08-21 DIAGNOSIS — N183 Chronic kidney disease, stage 3 (moderate): Secondary | ICD-10-CM | POA: Diagnosis not present

## 2017-08-21 DIAGNOSIS — I950 Idiopathic hypotension: Secondary | ICD-10-CM | POA: Diagnosis not present

## 2017-08-23 DIAGNOSIS — Z7951 Long term (current) use of inhaled steroids: Secondary | ICD-10-CM | POA: Diagnosis not present

## 2017-08-23 DIAGNOSIS — Z9181 History of falling: Secondary | ICD-10-CM | POA: Diagnosis not present

## 2017-08-23 DIAGNOSIS — J9621 Acute and chronic respiratory failure with hypoxia: Secondary | ICD-10-CM | POA: Diagnosis not present

## 2017-08-23 DIAGNOSIS — Z87440 Personal history of urinary (tract) infections: Secondary | ICD-10-CM | POA: Diagnosis not present

## 2017-08-23 DIAGNOSIS — I130 Hypertensive heart and chronic kidney disease with heart failure and stage 1 through stage 4 chronic kidney disease, or unspecified chronic kidney disease: Secondary | ICD-10-CM | POA: Diagnosis not present

## 2017-08-23 DIAGNOSIS — F419 Anxiety disorder, unspecified: Secondary | ICD-10-CM | POA: Diagnosis not present

## 2017-08-23 DIAGNOSIS — I2510 Atherosclerotic heart disease of native coronary artery without angina pectoris: Secondary | ICD-10-CM | POA: Diagnosis not present

## 2017-08-23 DIAGNOSIS — I255 Ischemic cardiomyopathy: Secondary | ICD-10-CM | POA: Diagnosis not present

## 2017-08-23 DIAGNOSIS — Z951 Presence of aortocoronary bypass graft: Secondary | ICD-10-CM | POA: Diagnosis not present

## 2017-08-23 DIAGNOSIS — Z95810 Presence of automatic (implantable) cardiac defibrillator: Secondary | ICD-10-CM | POA: Diagnosis not present

## 2017-08-23 DIAGNOSIS — Z87891 Personal history of nicotine dependence: Secondary | ICD-10-CM | POA: Diagnosis not present

## 2017-08-23 DIAGNOSIS — I482 Chronic atrial fibrillation: Secondary | ICD-10-CM | POA: Diagnosis not present

## 2017-08-23 DIAGNOSIS — N183 Chronic kidney disease, stage 3 (moderate): Secondary | ICD-10-CM | POA: Diagnosis not present

## 2017-08-23 DIAGNOSIS — E1122 Type 2 diabetes mellitus with diabetic chronic kidney disease: Secondary | ICD-10-CM | POA: Diagnosis not present

## 2017-08-23 DIAGNOSIS — Z9981 Dependence on supplemental oxygen: Secondary | ICD-10-CM | POA: Diagnosis not present

## 2017-08-23 DIAGNOSIS — Z7982 Long term (current) use of aspirin: Secondary | ICD-10-CM | POA: Diagnosis not present

## 2017-08-23 DIAGNOSIS — J449 Chronic obstructive pulmonary disease, unspecified: Secondary | ICD-10-CM | POA: Diagnosis not present

## 2017-08-23 DIAGNOSIS — I5023 Acute on chronic systolic (congestive) heart failure: Secondary | ICD-10-CM | POA: Diagnosis not present

## 2017-08-25 DIAGNOSIS — N183 Chronic kidney disease, stage 3 (moderate): Secondary | ICD-10-CM | POA: Diagnosis not present

## 2017-08-25 DIAGNOSIS — I2510 Atherosclerotic heart disease of native coronary artery without angina pectoris: Secondary | ICD-10-CM | POA: Diagnosis not present

## 2017-08-25 DIAGNOSIS — I130 Hypertensive heart and chronic kidney disease with heart failure and stage 1 through stage 4 chronic kidney disease, or unspecified chronic kidney disease: Secondary | ICD-10-CM | POA: Diagnosis not present

## 2017-08-25 DIAGNOSIS — I482 Chronic atrial fibrillation: Secondary | ICD-10-CM | POA: Diagnosis not present

## 2017-08-25 DIAGNOSIS — J449 Chronic obstructive pulmonary disease, unspecified: Secondary | ICD-10-CM | POA: Diagnosis not present

## 2017-08-25 DIAGNOSIS — E1122 Type 2 diabetes mellitus with diabetic chronic kidney disease: Secondary | ICD-10-CM | POA: Diagnosis not present

## 2017-08-26 ENCOUNTER — Emergency Department (HOSPITAL_COMMUNITY): Payer: Medicare Other

## 2017-08-26 ENCOUNTER — Encounter (HOSPITAL_COMMUNITY): Payer: Self-pay | Admitting: Emergency Medicine

## 2017-08-26 ENCOUNTER — Inpatient Hospital Stay (HOSPITAL_COMMUNITY)
Admission: EM | Admit: 2017-08-26 | Discharge: 2017-08-30 | DRG: 291 | Disposition: A | Discharge status: Home Health | Payer: Medicare Other | Attending: Family Medicine | Admitting: Family Medicine

## 2017-08-26 ENCOUNTER — Other Ambulatory Visit: Payer: Self-pay

## 2017-08-26 DIAGNOSIS — X58XXXD Exposure to other specified factors, subsequent encounter: Secondary | ICD-10-CM | POA: Diagnosis present

## 2017-08-26 DIAGNOSIS — I447 Left bundle-branch block, unspecified: Secondary | ICD-10-CM | POA: Diagnosis not present

## 2017-08-26 DIAGNOSIS — Z7951 Long term (current) use of inhaled steroids: Secondary | ICD-10-CM

## 2017-08-26 DIAGNOSIS — Z880 Allergy status to penicillin: Secondary | ICD-10-CM

## 2017-08-26 DIAGNOSIS — Z9981 Dependence on supplemental oxygen: Secondary | ICD-10-CM

## 2017-08-26 DIAGNOSIS — Z951 Presence of aortocoronary bypass graft: Secondary | ICD-10-CM

## 2017-08-26 DIAGNOSIS — Z95810 Presence of automatic (implantable) cardiac defibrillator: Secondary | ICD-10-CM

## 2017-08-26 DIAGNOSIS — I5023 Acute on chronic systolic (congestive) heart failure: Secondary | ICD-10-CM | POA: Diagnosis present

## 2017-08-26 DIAGNOSIS — R072 Precordial pain: Secondary | ICD-10-CM | POA: Diagnosis not present

## 2017-08-26 DIAGNOSIS — Z888 Allergy status to other drugs, medicaments and biological substances status: Secondary | ICD-10-CM

## 2017-08-26 DIAGNOSIS — Z7982 Long term (current) use of aspirin: Secondary | ICD-10-CM

## 2017-08-26 DIAGNOSIS — I509 Heart failure, unspecified: Secondary | ICD-10-CM | POA: Insufficient documentation

## 2017-08-26 DIAGNOSIS — S2249XD Multiple fractures of ribs, unspecified side, subsequent encounter for fracture with routine healing: Secondary | ICD-10-CM | POA: Diagnosis not present

## 2017-08-26 DIAGNOSIS — R0789 Other chest pain: Secondary | ICD-10-CM | POA: Diagnosis not present

## 2017-08-26 DIAGNOSIS — Z8249 Family history of ischemic heart disease and other diseases of the circulatory system: Secondary | ICD-10-CM

## 2017-08-26 DIAGNOSIS — I110 Hypertensive heart disease with heart failure: Secondary | ICD-10-CM | POA: Diagnosis not present

## 2017-08-26 DIAGNOSIS — J9601 Acute respiratory failure with hypoxia: Secondary | ICD-10-CM | POA: Diagnosis not present

## 2017-08-26 DIAGNOSIS — Z79899 Other long term (current) drug therapy: Secondary | ICD-10-CM

## 2017-08-26 DIAGNOSIS — Z885 Allergy status to narcotic agent status: Secondary | ICD-10-CM | POA: Diagnosis not present

## 2017-08-26 DIAGNOSIS — I255 Ischemic cardiomyopathy: Secondary | ICD-10-CM | POA: Diagnosis present

## 2017-08-26 DIAGNOSIS — R0902 Hypoxemia: Secondary | ICD-10-CM | POA: Diagnosis not present

## 2017-08-26 DIAGNOSIS — I340 Nonrheumatic mitral (valve) insufficiency: Secondary | ICD-10-CM | POA: Diagnosis present

## 2017-08-26 DIAGNOSIS — E785 Hyperlipidemia, unspecified: Secondary | ICD-10-CM | POA: Diagnosis present

## 2017-08-26 DIAGNOSIS — R079 Chest pain, unspecified: Secondary | ICD-10-CM | POA: Diagnosis not present

## 2017-08-26 DIAGNOSIS — I2510 Atherosclerotic heart disease of native coronary artery without angina pectoris: Secondary | ICD-10-CM | POA: Diagnosis present

## 2017-08-26 DIAGNOSIS — J9602 Acute respiratory failure with hypercapnia: Secondary | ICD-10-CM | POA: Diagnosis not present

## 2017-08-26 DIAGNOSIS — J449 Chronic obstructive pulmonary disease, unspecified: Secondary | ICD-10-CM | POA: Diagnosis not present

## 2017-08-26 DIAGNOSIS — Z87891 Personal history of nicotine dependence: Secondary | ICD-10-CM

## 2017-08-26 DIAGNOSIS — F419 Anxiety disorder, unspecified: Secondary | ICD-10-CM | POA: Diagnosis present

## 2017-08-26 DIAGNOSIS — Z8674 Personal history of sudden cardiac arrest: Secondary | ICD-10-CM

## 2017-08-26 DIAGNOSIS — R0602 Shortness of breath: Secondary | ICD-10-CM | POA: Diagnosis not present

## 2017-08-26 DIAGNOSIS — I959 Hypotension, unspecified: Secondary | ICD-10-CM | POA: Diagnosis not present

## 2017-08-26 LAB — BASIC METABOLIC PANEL
ANION GAP: 8 (ref 5–15)
BUN: 14 mg/dL (ref 6–20)
CALCIUM: 9.2 mg/dL (ref 8.9–10.3)
CO2: 27 mmol/L (ref 22–32)
CREATININE: 1.1 mg/dL — AB (ref 0.44–1.00)
Chloride: 105 mmol/L (ref 101–111)
GFR calc non Af Amer: 46 mL/min — ABNORMAL LOW (ref >60)
GFR, EST AFRICAN AMERICAN: 53 mL/min — AB (ref >60)
GLUCOSE: 236 mg/dL — AB (ref 65–99)
Potassium: 4.9 mmol/L (ref 3.5–5.1)
Sodium: 140 mmol/L (ref 135–145)

## 2017-08-26 LAB — I-STAT ARTERIAL BLOOD GAS, ED
Bicarbonate: 26.3 mmol/L (ref 20.0–28.0)
O2 Saturation: 99 %
PO2 ART: 150 mmHg — AB (ref 83.0–108.0)
Patient temperature: 98.6
TCO2: 28 mmol/L (ref 22–32)
pCO2 arterial: 52.1 mmHg — ABNORMAL HIGH (ref 32.0–48.0)
pH, Arterial: 7.311 — ABNORMAL LOW (ref 7.350–7.450)

## 2017-08-26 LAB — CBC WITH DIFFERENTIAL/PLATELET
ABS IMMATURE GRANULOCYTES: 0.1 10*3/uL (ref 0.0–0.1)
BASOS ABS: 0.1 10*3/uL (ref 0.0–0.1)
Basophils Relative: 1 %
EOS PCT: 2 %
Eosinophils Absolute: 0.2 10*3/uL (ref 0.0–0.7)
HCT: 34.1 % — ABNORMAL LOW (ref 36.0–46.0)
Hemoglobin: 10.2 g/dL — ABNORMAL LOW (ref 12.0–15.0)
Immature Granulocytes: 1 %
Lymphocytes Relative: 15 %
Lymphs Abs: 1.5 10*3/uL (ref 0.7–4.0)
MCH: 28.9 pg (ref 26.0–34.0)
MCHC: 29.9 g/dL — ABNORMAL LOW (ref 30.0–36.0)
MCV: 96.6 fL (ref 78.0–100.0)
MONO ABS: 0.6 10*3/uL (ref 0.1–1.0)
Monocytes Relative: 7 %
NEUTROS ABS: 7.3 10*3/uL (ref 1.7–7.7)
Neutrophils Relative %: 76 %
Platelets: 253 10*3/uL (ref 150–400)
RBC: 3.53 MIL/uL — ABNORMAL LOW (ref 3.87–5.11)
RDW: 14.6 % (ref 11.5–15.5)
WBC: 9.7 10*3/uL (ref 4.0–10.5)

## 2017-08-26 LAB — I-STAT CG4 LACTIC ACID, ED
LACTIC ACID, VENOUS: 1.68 mmol/L (ref 0.5–1.9)
LACTIC ACID, VENOUS: 1.25 mmol/L (ref 0.5–1.9)

## 2017-08-26 LAB — BRAIN NATRIURETIC PEPTIDE: B Natriuretic Peptide: 914.1 pg/mL — ABNORMAL HIGH (ref 0.0–100.0)

## 2017-08-26 LAB — URINALYSIS, ROUTINE W REFLEX MICROSCOPIC
Bilirubin Urine: NEGATIVE
Glucose, UA: NEGATIVE mg/dL
Hgb urine dipstick: NEGATIVE
Ketones, ur: NEGATIVE mg/dL
Leukocytes, UA: NEGATIVE
NITRITE: NEGATIVE
Protein, ur: NEGATIVE mg/dL
SPECIFIC GRAVITY, URINE: 1.006 (ref 1.005–1.030)
pH: 5 (ref 5.0–8.0)

## 2017-08-26 LAB — TROPONIN I
TROPONIN I: 0.03 ng/mL — AB (ref <0.03)
Troponin I: 0.04 ng/mL (ref <0.03)

## 2017-08-26 LAB — I-STAT TROPONIN, ED: TROPONIN I, POC: 0 ng/mL (ref 0.00–0.08)

## 2017-08-26 MED ORDER — NITROGLYCERIN 0.4 MG SL SUBL: 0.4000 mg | SUBLINGUAL_TABLET | SUBLINGUAL | Status: DC | PRN

## 2017-08-26 MED ORDER — PANTOPRAZOLE SODIUM 40 MG PO TBEC
40.0000 mg | DELAYED_RELEASE_TABLET | Freq: Every day | ORAL | Status: DC
  Administered 2017-08-26 – 2017-08-30 (×5): 40 mg via ORAL
  Filled 2017-08-26 (×5): qty 1

## 2017-08-26 MED ORDER — ALBUTEROL SULFATE (2.5 MG/3ML) 0.083% IN NEBU
2.5000 mg | INHALATION_SOLUTION | Freq: Four times a day (QID) | RESPIRATORY_TRACT | Status: DC | PRN
  Administered 2017-08-27 – 2017-08-30 (×7): 2.5 mg via RESPIRATORY_TRACT
  Filled 2017-08-26 (×7): qty 3

## 2017-08-26 MED ORDER — ONDANSETRON HCL 4 MG PO TABS: 4.0000 mg | ORAL_TABLET | Freq: Four times a day (QID) | ORAL | Status: DC | PRN

## 2017-08-26 MED ORDER — SPIRONOLACTONE 25 MG PO TABS
25.0000 mg | ORAL_TABLET | Freq: Every day | ORAL | Status: DC
  Administered 2017-08-27: 25 mg via ORAL
  Filled 2017-08-26: qty 1

## 2017-08-26 MED ORDER — SODIUM CHLORIDE 0.9 % IV SOLN
250.0000 mL | INTRAVENOUS | Status: DC | PRN
Start: 2017-08-26 — End: 2017-08-30

## 2017-08-26 MED ORDER — ONDANSETRON HCL 4 MG/2ML IJ SOLN: 4.0000 mg | Freq: Four times a day (QID) | INTRAMUSCULAR | Status: DC | PRN

## 2017-08-26 MED ORDER — FLUTICASONE FUROATE-VILANTEROL 200-25 MCG/INH IN AEPB
1.0000 | INHALATION_SPRAY | Freq: Every day | RESPIRATORY_TRACT | Status: DC
  Administered 2017-08-27 – 2017-08-30 (×4): 1 via RESPIRATORY_TRACT
  Filled 2017-08-26: qty 28

## 2017-08-26 MED ORDER — FUROSEMIDE 10 MG/ML IJ SOLN
40.0000 mg | Freq: Once | INTRAMUSCULAR | Status: AC
  Administered 2017-08-26: 40 mg via INTRAVENOUS
  Filled 2017-08-26: qty 4

## 2017-08-26 MED ORDER — LISINOPRIL 10 MG PO TABS
10.0000 mg | ORAL_TABLET | Freq: Every day | ORAL | Status: DC
  Administered 2017-08-27: 10 mg via ORAL
  Filled 2017-08-26 (×2): qty 1

## 2017-08-26 MED ORDER — SODIUM CHLORIDE 0.9% FLUSH
3.0000 mL | Freq: Two times a day (BID) | INTRAVENOUS | Status: DC
  Administered 2017-08-26 – 2017-08-28 (×5): 3 mL via INTRAVENOUS
  Administered 2017-08-29: 22:00:00 via INTRAVENOUS

## 2017-08-26 MED ORDER — SERTRALINE HCL 50 MG PO TABS
50.0000 mg | ORAL_TABLET | Freq: Every day | ORAL | Status: DC
  Administered 2017-08-27 – 2017-08-30 (×4): 50 mg via ORAL
  Filled 2017-08-26 (×4): qty 1

## 2017-08-26 MED ORDER — ENOXAPARIN SODIUM 40 MG/0.4ML ~~LOC~~ SOLN
40.0000 mg | SUBCUTANEOUS | Status: DC
  Administered 2017-08-26 – 2017-08-29 (×4): 40 mg via SUBCUTANEOUS
  Filled 2017-08-26 (×4): qty 0.4

## 2017-08-26 MED ORDER — ACETAMINOPHEN 650 MG RE SUPP: 650.0000 mg | Freq: Four times a day (QID) | RECTAL | Status: DC | PRN

## 2017-08-26 MED ORDER — ACETAMINOPHEN 325 MG PO TABS
650.0000 mg | ORAL_TABLET | ORAL | Status: DC | PRN
  Administered 2017-08-26 – 2017-08-28 (×4): 650 mg via ORAL
  Filled 2017-08-26 (×5): qty 2

## 2017-08-26 MED ORDER — ALPRAZOLAM 0.5 MG PO TABS
1.0000 mg | ORAL_TABLET | Freq: Two times a day (BID) | ORAL | Status: DC | PRN
  Administered 2017-08-26 – 2017-08-30 (×8): 1 mg via ORAL
  Filled 2017-08-26 (×8): qty 2

## 2017-08-26 MED ORDER — LORAZEPAM 2 MG/ML IJ SOLN
0.5000 mg | Freq: Once | INTRAMUSCULAR | Status: AC
  Administered 2017-08-26: 0.5 mg via INTRAVENOUS
  Filled 2017-08-26: qty 1

## 2017-08-26 MED ORDER — ASPIRIN EC 81 MG PO TBEC
81.0000 mg | DELAYED_RELEASE_TABLET | Freq: Every day | ORAL | Status: DC
  Administered 2017-08-27 – 2017-08-30 (×4): 81 mg via ORAL
  Filled 2017-08-26 (×5): qty 1

## 2017-08-26 MED ORDER — SODIUM CHLORIDE 0.9% FLUSH: 3.0000 mL | INTRAVENOUS | Status: DC | PRN

## 2017-08-26 MED ORDER — CARVEDILOL 3.125 MG PO TABS
3.1250 mg | ORAL_TABLET | Freq: Two times a day (BID) | ORAL | Status: DC
  Administered 2017-08-27 (×2): 3.125 mg via ORAL
  Filled 2017-08-26 (×2): qty 1

## 2017-08-26 MED ORDER — ISOSORBIDE MONONITRATE ER 30 MG PO TB24
30.0000 mg | ORAL_TABLET | Freq: Every day | ORAL | Status: DC
  Administered 2017-08-26 – 2017-08-27 (×2): 30 mg via ORAL
  Filled 2017-08-26 (×2): qty 1

## 2017-08-26 MED ORDER — ACETAMINOPHEN 325 MG PO TABS
650.0000 mg | ORAL_TABLET | Freq: Four times a day (QID) | ORAL | Status: DC | PRN
  Administered 2017-08-27 – 2017-08-30 (×6): 650 mg via ORAL
  Filled 2017-08-26 (×6): qty 2

## 2017-08-26 NOTE — ED Notes (Signed)
Paged Dr.Walden to nurse's station

## 2017-08-26 NOTE — Progress Notes (Signed)
Patient refuses CPAP 

## 2017-08-26 NOTE — ED Notes (Signed)
EMS had started IV in right hand but on arrival to ED room pt's IV was lost, bandage applied.  Pt also reports having some broken ribs from CPR 1 month ago.

## 2017-08-26 NOTE — ED Notes (Signed)
Attempted to give report to floor nurse ; nurse not available at this time

## 2017-08-26 NOTE — ED Provider Notes (Signed)
[facility] EMERGENCY DEPARTMENT Provider Note   CSN: [id number] Arrival date & time: 08/26/17  0932     History   Chief Complaint Chief Complaint  Patient presents with  . Chest Pain    HPI Brandi Gardner is a 83 y.o. female.  Pt presents to the ED today with sob and cp.  The pt has an extensive cardiac and pulmonary hx.  Most recently, she presented to [facility] in May with pna.  She developed cardiac arrest and was intubated.  She was transferred to [facility].  She had an AICD which revealed VF and VT.  She had a + cardiolyte stress test, but they did not do a cath due to renal function.  The pt had pna treated with levaquin and CHF.  She also had an UTI and a COPD exacerbation.  The pt was d/c to SNF on 6/1.  Pt said she was d/c from the SNF on June 15th.  The pt started getting sob last night. She does have some cp, but thinks that is from her broken ribs (from CPR).  Per EMS, pt's O2 sat on her normal 2L was in the 70s.  She was given asa 324 mg and 2 sl nitro by EMS.  Most recent labs from OSH  LABS: reviewed, see belowCBC: Recent Labs  Lab 08/05/17 0441 08/06/17 0449  WBC 9.3 12.0*  HGB 8.2* 8.2*  HCT 27.2* 26.8*  PLT 221 229   CMP: Recent Labs  Lab 08/05/17 0441 08/06/17 0449  NA 143 143  K 4.0 3.8  CL 101 98  CO2 34* 36*  BUN 40* 33*  CREATININE 1.10* 0.90  CALCIUM 8.5* 8.8   COAGS:No results for input(s): LABPROT, PTT, INR in the last 168 hours. CARDIAC: Recent Labs  Lab 08/06/17 0449  BNP 4,619*   ABG:  No results found for this or any previous visit (from the past 72 hour(s)). THYROID: Recent Labs  Lab 08/03/17 0557  TSH 1.160        Past Medical History:  Diagnosis Date  . Acute on chronic systolic (congestive) heart failure ([place]) 04/24/2015  . AICD (automatic cardioverter/defibrillator) present   . Anxiety   . Cardiac defibrillator in place 04/25/2015  . CHF (congestive heart failure) ([place])   . Chronic  obstructive pulmonary disease with acute exacerbation ([place]) 04/22/2015  . COPD (chronic obstructive pulmonary disease) ([place])   . Coronary artery disease   . Hx of pulmonary embolus 1980  . Hyperlipidemia   . Hypertension   . Ischemic cardiomyopathy 05/24/2015  . S/P CABG (coronary artery bypass graft) 05/24/2015    Patient Active Problem List   Diagnosis Date Noted  . Hyperkalemia 10/22/2016  . Mitral regurgitation 10/13/2016  . Hyperlipidemia   . Ischemic cardiomyopathy 05/24/2015  . S/P CABG (coronary artery bypass graft) 05/24/2015  . Cardiac defibrillator in place 04/25/2015  . Acute on chronic systolic (congestive) heart failure ([place]) 04/24/2015  . Chronic obstructive pulmonary disease with acute exacerbation ([place]) 04/22/2015  . Hx of pulmonary embolus 03/10/1978    Past Surgical History:  Procedure Laterality Date  . CARDIAC CATHETERIZATION    . CORONARY ARTERY BYPASS GRAFT    . IR GENERIC HISTORICAL  11/08/2015   IR RADIOLOGIST EVAL & MGMT 11/08/2015 [facility]  . IR GENERIC HISTORICAL  11/13/2015   IR VERTEBROPLASTY LUMBAR BX INC UNI/BIL INC/INJECT/IMAGING 11/13/2015 Luanne Bras, MD [facility]  . IR GENERIC HISTORICAL  12/07/2015   IR RADIOLOGIST EVAL & MGMT  12/07/2015 [facility]  . TUBAL LIGATION       OB History   None      Home Medications    Prior to Admission medications   Medication Sig Start Date End Date Taking? Authorizing Provider  acetaminophen (TYLENOL) 500 MG tablet Take 500 mg by mouth every 6 (six) hours as needed for moderate pain or headache.    [provider]  albuterol (PROVENTIL HFA;VENTOLIN HFA) 108 (90 Base) MCG/ACT inhaler Inhale 2 puffs into the lungs every 6 (six) hours as needed for wheezing or shortness of breath.    [provider]  albuterol (PROVENTIL) (2.5 MG/3ML) 0.083% nebulizer solution Take 2.5 mg by nebulization every 6 (six) hours as needed for wheezing or shortness of breath.    [provider]  ALPRAZolam (XANAX) 1 MG tablet TAKE 1 TABLET BY MOUTH TWICE A DAY AS NEEDED FOR ANXIETY 06/02/17   [provider]  aspirin EC 81 MG tablet Take 81 mg by mouth daily.    [provider]  carvedilol (COREG) 3.125 MG tablet Take 1 tablet (3.125 mg total) by mouth 2 (two) times daily with a meal. 06/23/17   Revankar, Reita Cliche, MD  cyanocobalamin (,VITAMIN B-12,) 1000 MCG/ML injection INJECT IM ONCE DAILY FOR 5 DAYS THEN ONCE WEEKLY FOR 4 WEEKS THEN INJECT ONCE MONTHLY AS DIRECTED 06/09/17   [provider]  ferrous sulfate 324 (65 Fe) MG TBEC Take 1 tablet by mouth daily. 06/09/17   [provider]  fluticasone (FLONASE) 50 MCG/ACT nasal spray Place 2 sprays into both nostrils daily.    [provider]  fluticasone (FLOVENT HFA) 110 MCG/ACT inhaler Inhale 2 puffs into the lungs daily.    [provider]  fluticasone furoate-vilanterol (BREO ELLIPTA) 200-25 MCG/INH AEPB Inhale 1 puff into the lungs daily. 07/21/17   Mannam, Hart Robinsons, MD  furosemide (LASIX) 40 MG tablet Take 40 mg by mouth 2 (two) times daily.    [provider]  isosorbide mononitrate (IMDUR) 30 MG 24 hr tablet Take 30 mg by mouth daily. 06/13/17   [provider]  nitroGLYCERIN (NITROSTAT) 0.4 MG SL tablet Place 0.4 mg under the tongue every 5 (five) minutes as needed for chest pain.    [provider]  pantoprazole (PROTONIX) 40 MG tablet Take 40 mg by mouth daily. 06/09/17   [provider]  spironolactone (ALDACTONE) 25 MG tablet Take 25 mg by mouth daily.    [provider]  tiotropium (SPIRIVA) 18 MCG inhalation capsule Place 18 mcg into inhaler and inhale daily.    [provider]    Family History Family History  Problem Relation Age of Onset  . Hypertension Mother   . Uterine cancer Mother   . Hypertension Father     Social History Social History   Tobacco Use  . Smoking status: Former Smoker    Last attempt to quit:  11/12/1992    Years since quitting: 24.8  . Smokeless tobacco: Never Used  Substance Use Topics  . Alcohol use: No  . Drug use: No     Allergies   Benadryl [diphenhydramine]; Penicillins; Potassium-containing compounds; Percocet [oxycodone-acetaminophen]; Vicodin [hydrocodone-acetaminophen]; Potassium; Statins; and Tramadol   Review of Systems Review of Systems  Respiratory: Positive for shortness of breath.   Cardiovascular: Positive for chest pain.  All other systems reviewed and are negative.    Physical Exam Updated Vital Signs BP 126/63   Pulse 78   Resp 17   SpO2  100%   Physical Exam  Constitutional: She is oriented to person, place, and time. She appears ill.  HENT:  Head: Normocephalic and atraumatic.  Eyes: Pupils are equal, round, and reactive to light. EOM are normal.  Neck: Normal range of motion. Neck supple.  Cardiovascular: Regular rhythm, intact distal pulses and normal pulses. Tachycardia present.  Pulmonary/Chest: Accessory muscle usage present. Tachypnea noted.  Abdominal: Soft. Bowel sounds are normal.  Musculoskeletal: Normal range of motion.       Right lower leg: Normal.       Left lower leg: Normal.  Neurological: She is alert and oriented to person, place, and time.  Skin: Skin is warm. Capillary refill takes less than 2 seconds.  Psychiatric: She has a normal mood and affect. Her behavior is normal.  Nursing note and vitals reviewed.    ED Treatments / Results  Labs (all labs ordered are listed, but only abnormal results are displayed) Labs Reviewed  BASIC METABOLIC PANEL - Abnormal; Notable for the following components:      Result Value   Glucose, Bld 236 (*)    Creatinine, Ser 1.10 (*)    GFR calc non Af Amer 46 (*)    GFR calc Af Amer 53 (*)    All other components within normal limits  CBC WITH DIFFERENTIAL/PLATELET - Abnormal; Notable for the following components:   RBC 3.53 (*)    Hemoglobin 10.2 (*)    HCT 34.1 (*)    MCHC  29.9 (*)    All other components within normal limits  BRAIN NATRIURETIC PEPTIDE - Abnormal; Notable for the following components:   B Natriuretic Peptide 914.1 (*)    All other components within normal limits  I-STAT ARTERIAL BLOOD GAS, ED - Abnormal; Notable for the following components:   pH, Arterial 7.311 (*)    pCO2 arterial 52.1 (*)    pO2, Arterial 150.0 (*)    All other components within normal limits  CULTURE, BLOOD (ROUTINE X 2)  CULTURE, BLOOD (ROUTINE X 2)  URINALYSIS, ROUTINE W REFLEX MICROSCOPIC  I-STAT TROPONIN, ED  I-STAT CG4 LACTIC ACID, ED  I-STAT CG4 LACTIC ACID, ED    EKG EKG Interpretation  Date/Time:  Wednesday August 26 2017 09:42:13 EDT Ventricular Rate:  125 PR Interval:    QRS Duration: 131 QT Interval:  341 QTC Calculation: 492 R Axis:   92 Text Interpretation:  Sinus tachycardia Nonspecific intraventricular conduction delay Probable anteroseptal infarct, recent Lateral leads are also involved No old tracing to compare Confirmed by Isla Pence [phone]) on 08/26/2017 10:02:17 AM   Radiology Dg Chest Port 1 View  Result Date: 08/26/2017 CLINICAL DATA:  Shortness of breath and chest pain. EXAM: PORTABLE CHEST 1 VIEW COMPARISON:  Chest x-ray dated Jul 24, 2017. FINDINGS: Unchanged left chest wall pacer device. Stable cardiomediastinal silhouette status post CABG. Diffuse, mildly increased interstitial markings throughout both lungs. No focal consolidation, pleural effusion, or pneumothorax. No acute osseous abnormality. IMPRESSION: 1. New mild interstitial pulmonary edema. Electronically Signed   By: Titus Dubin M.D.   On: 08/26/2017 10:11    Procedures Procedures (including critical care time)  Medications Ordered in ED Medications  LORazepam (ATIVAN) injection 0.5 mg (0.5 mg Intravenous Given 08/26/17 1346)  furosemide (LASIX) injection 40 mg (40 mg Intravenous Given 08/26/17 1346)     Initial Impression / Assessment and Plan / ED Course  I  have reviewed the triage vital signs and the nursing notes.  Pertinent labs & imaging results that  were available during my care of the patient were reviewed by me and considered in my medical decision making (see chart for details).    CRITICAL CARE Performed by: Isla Pence   Total critical care time: 30 minutes  Critical care time was exclusive of separately billable procedures and treating other patients.  Critical care was necessary to treat or prevent imminent or life-threatening deterioration.  Critical care was time spent personally by me on the following activities: development of treatment plan with patient and/or surrogate as well as nursing, discussions with consultants, evaluation of patient's response to treatment, examination of patient, obtaining history from patient or surrogate, ordering and performing treatments and interventions, ordering and review of laboratory studies, ordering and review of radiographic studies, pulse oximetry and re-evaluation of patient's condition.  Pt has improved significantly on bipap.  The pt was d/w FM residents for admission.  Final Clinical Impressions(s) / ED Diagnoses   Final diagnoses:  Acute on chronic congestive heart failure, unspecified heart failure type ([place])  Acute respiratory failure with hypoxia and hypercapnia [facility])    ED Discharge Orders    None       Isla Pence, MD 08/26/17 1349

## 2017-08-26 NOTE — H&P (Addendum)
[facility] Admission History and Physical Service Pager: [phone]  [name] Medical record number: [id number] Date of birth: 1934-09-17 Age: 83 y.o. Gender: female  Primary Care Provider: Cyndi Bender, PA-C Consultants: none Code Status: full  Chief Complaint: shortness of breath  Assessment and Plan: Brandi Gardner is a 83 y.o. female presenting with shortness of breath likely due to chf exacerbation.   Shortness of breath, acute, worsening Patient with one day history of shortness of breath. BNP elevated >900, pulm vascular congestion on chest xray in setting of decreased lasix dose (used to be on 40 bid and was recently decrease to 20 every other other while she was in rehab) makes this likely be a chf exacerbation. Other items on differential are PE, CAD, COPD, pneumonia. PE very unlikely with wells score of 1.5 on admission (due to tachycardia). Coronary artery disease is possible given patient's history (AICD) place and substernal chest pain with right arm numbness/tingling. It is unclear if this chest pain is all chronic from her fractured ribs 2/2 to recent chest compression after patient had cardiac arrest or is superimposed on cardiac chest pain. Troponin negative, with likely recent anteroseptal infarct (likely from past CAD episodes). COPD and pneumonia are unlikely based on imaging and history. Will ask for cardiology input based on history and symptoms. - admit to [facility], Dr. Mingo Amber, appropriate for stepdown - cardiac monitoring - continuous pulse ox - continue bipap - Lasix 40mg  IV in ED - strict I/O - daily weights - Echocardiogram - consider cardiology input on chest pain - albuterol nebs as needed for wheezing - aspirin 81mg  - repeat chest xray in am - ekg in am -Follow up on CBC and BMP in the am  Hypercarbic hypoxia respiratory failure Patient presents with worsening shortness of breath with an ABG showing pH 7.311,  pCO2 52.1, pO2 150, HCO3 26.3 consistent with CO2 retention in the setting worsening respiratory effort. CXR findings and lab results consistent with CHF and fluid overload which has resulted on worsening oxygenation. Patient placed on bipap with improved oxygenation and mentation. --Continue to monitor respiratory status --Bipap prn w --furosemide 40 mg IV --COPD home regimen   COPD on oxygen at home 2L baseline Takes breo for controller and albuterol prn. Will continue albuterol while admitted, no indication that she is having an exacerbation. Breo 1 puff daily, albuterol 2.5mg  q 4 hours prn.  CAD  CABG Patient with very complex cardiac history (ischemic cardiomyopathy, AICD, mitral valve insufficiency) . Having some chest pain radiating to R her arm with some numbness and tingling. Unclear if superimposed cardiac on top of MSK 2/2 broken ribs or if all due to broken bones. Will repeat an ekg in the am and discuss case with cardiology.  - trend troponins - ekg in am - Follow up on cards consults  Broken ribs 2/2 recent CPR Healing broken ribs. Pain well controlled on tylenol. Will continue incentive spirometry. Can increase pain meds if needed. --Acetaminophen 650 mg q6 prn   HFrEF Present today with elevated BNP >900, pulmonary congestion with LE edema and crackles. Echo from OSH showing EF 25-30%. Patient on systolic heart failure regimen of coreg, lasix, imdur, spiro. Looks like she should have been on arb after discharge but this does not appear to be part of her medication list. There is even mention of starting entresto. Will place on low dose lisinopril while heart would defer to outpatient cardiologist to start entresto. --Repeat ECHO --Lasix IV  40 mg  --Strict I/O --Daily weights --Cardiology consult, appreciate recs  Anxiety Patient reports being on Xanax 1 mg daily for "many years" was recently decrease to 0.25 mg. Patient reports anxiety is ot well controlled and would  like to returned to previous dose. --0.5 mg ativan one time concerned about confusion and patient being on bipap --Will discuss dosage when more appropriate for full assessment    PMH is significant for COPD, History of PE, s/p CABG, Chronic systolic heart failure, HLD, CAD  FEN/GI: NPO while on Bipap, rwil resume normal diet when appropriate Prophylaxis: Lovenox  Disposition: home  History of Present Illness:  Brandi Gardner is a 83 y.o. female presenting with one day history of shortness of breath. She states that she woke up this morning and was having to work much hard to breath than her baseline. She usually needs two pillows at night and states that she has increased shortness of breath even while walking distances <1 city block now.  In the ED patient had a bmp, bnp, cbc, Lactic acid, troponin, ABG, blood culture, chest xray, ekg. Cr 1.1, bnp 914, trop 0, LA 1.25,  ABG 7.3/52/150/26. Chest xray showing pulmonary vascular congestion.   Patient was eventually started on bipap for difficulty breathing. Her symptoms improved considerably with the bipap. At that point she required admission and family medicine was called.   Recently admitted to OSH where she was admitted for mixed chf, pneumonia picture. She had a cardiac arrest requiring cpr, from which she developed broken ribs. Able to get over that illness but her dose of lasix was decreased from 40mg  daily to 20mg  every other day per her report.   Review Of Systems: Per HPI with the following additions:  Review of Systems  Constitutional: Negative for chills and fever.  HENT: Negative for congestion.   Respiratory: Positive for cough, sputum production and shortness of breath. Negative for hemoptysis and wheezing.   Cardiovascular: Positive for chest pain, orthopnea, leg swelling and PND. Negative for palpitations.  Gastrointestinal: Negative for abdominal pain, constipation, diarrhea, nausea and vomiting.  Neurological: Negative for  dizziness and focal weakness.    Patient Active Problem List   Diagnosis Date Noted  . Hyperkalemia 10/22/2016  . Mitral regurgitation 10/13/2016  . Hyperlipidemia   . Ischemic cardiomyopathy 05/24/2015  . S/P CABG (coronary artery bypass graft) 05/24/2015  . Cardiac defibrillator in place 04/25/2015  . Acute on chronic systolic (congestive) heart failure ([place]) 04/24/2015  . Chronic obstructive pulmonary disease with acute exacerbation ([place]) 04/22/2015  . Hx of pulmonary embolus 03/10/1978    Past Medical History: Past Medical History:  Diagnosis Date  . Acute on chronic systolic (congestive) heart failure ([place]) 04/24/2015  . AICD (automatic cardioverter/defibrillator) present   . Anxiety   . Cardiac defibrillator in place 04/25/2015  . CHF (congestive heart failure) ([place])   . Chronic obstructive pulmonary disease with acute exacerbation ([place]) 04/22/2015  . COPD (chronic obstructive pulmonary disease) ([place])   . Coronary artery disease   . Hx of pulmonary embolus 1980  . Hyperlipidemia   . Hypertension   . Ischemic cardiomyopathy 05/24/2015  . S/P CABG (coronary artery bypass graft) 05/24/2015    Past Surgical History: Past Surgical History:  Procedure Laterality Date  . CARDIAC CATHETERIZATION    . CORONARY ARTERY BYPASS GRAFT    . IR GENERIC HISTORICAL  11/08/2015   IR RADIOLOGIST EVAL & MGMT 11/08/2015 [facility]  . IR GENERIC HISTORICAL  11/13/2015   IR VERTEBROPLASTY  LUMBAR BX INC UNI/BIL INC/INJECT/IMAGING 11/13/2015 Luanne Bras, MD [facility]  . IR GENERIC HISTORICAL  12/07/2015   IR RADIOLOGIST EVAL & MGMT 12/07/2015 [facility]  . TUBAL LIGATION      Social History: Social History   Tobacco Use  . Smoking status: Former Smoker    Last attempt to quit: 11/12/1992    Years since quitting: 24.8  . Smokeless tobacco: Never Used  Substance Use Topics  . Alcohol use: No  . Drug use: No   Additional social history: Please also refer to relevant sections of  EMR.  Family History: Family History  Problem Relation Age of Onset  . Hypertension Mother   . Uterine cancer Mother   . Hypertension Father     Allergies and Medications: Allergies  Allergen Reactions  . Benadryl [Diphenhydramine] Shortness Of Breath and Other (See Comments)    COPD  . Penicillins Other (See Comments)    Headache, childhood allergy  . Potassium-Containing Compounds Other (See Comments)    Mouth pains  . Percocet [Oxycodone-Acetaminophen] Hives  . Vicodin [Hydrocodone-Acetaminophen] Hives  . Potassium     Jaw pain  . Statins Nausea Only  . Tramadol Itching   No current facility-administered medications on file prior to encounter.    Current Outpatient Medications on File Prior to Encounter  Medication Sig Dispense Refill  . acetaminophen (TYLENOL) 500 MG tablet Take 500 mg by mouth every 6 (six) hours as needed for moderate pain or headache.    . albuterol (PROVENTIL HFA;VENTOLIN HFA) 108 (90 Base) MCG/ACT inhaler Inhale 2 puffs into the lungs every 6 (six) hours as needed for wheezing or shortness of breath.    Marland Kitchen albuterol (PROVENTIL) (2.5 MG/3ML) 0.083% nebulizer solution Take 2.5 mg by nebulization every 6 (six) hours as needed for wheezing or shortness of breath.    . ALPRAZolam (XANAX) 1 MG tablet TAKE 1 TABLET BY MOUTH TWICE A DAY AS NEEDED FOR ANXIETY  0  . aspirin EC 81 MG tablet Take 81 mg by mouth daily.    . carvedilol (COREG) 3.125 MG tablet Take 1 tablet (3.125 mg total) by mouth 2 (two) times daily with a meal. 180 tablet 0  . cyanocobalamin (,VITAMIN B-12,) 1000 MCG/ML injection INJECT IM ONCE DAILY FOR 5 DAYS THEN ONCE WEEKLY FOR 4 WEEKS THEN INJECT ONCE MONTHLY AS DIRECTED  0  . ferrous sulfate 324 (65 Fe) MG TBEC Take 1 tablet by mouth daily.  0  . fluticasone (FLONASE) 50 MCG/ACT nasal spray Place 2 sprays into both nostrils daily.    . fluticasone (FLOVENT HFA) 110 MCG/ACT inhaler Inhale 2 puffs into the lungs daily.    . fluticasone  furoate-vilanterol (BREO ELLIPTA) 200-25 MCG/INH AEPB Inhale 1 puff into the lungs daily. 1 each 4  . furosemide (LASIX) 40 MG tablet Take 40 mg by mouth 2 (two) times daily.    . isosorbide mononitrate (IMDUR) 30 MG 24 hr tablet Take 30 mg by mouth daily.  0  . nitroGLYCERIN (NITROSTAT) 0.4 MG SL tablet Place 0.4 mg under the tongue every 5 (five) minutes as needed for chest pain.    . pantoprazole (PROTONIX) 40 MG tablet Take 40 mg by mouth daily.  0  . spironolactone (ALDACTONE) 25 MG tablet Take 25 mg by mouth daily.    Marland Kitchen tiotropium (SPIRIVA) 18 MCG inhalation capsule Place 18 mcg into inhaler and inhale daily.      Objective: BP 126/63   Pulse 78   Resp 17  SpO2 100%  Exam: General: frail, elderly, caucasian female, resting comfortably in bed on Bipap Eyes: arcus senilus present ENTM: no external trauma to ears or face, bipap in place Neck: range of motion intact Cardiovascular: rrr, no m/r/g, palpable ulnar pulse bilaterally, palpable dp bilaterally, 2+ pitting edema BLE. Respiratory: crackles present in bilateral lung bases, on bipap, good work of breathing on bipap Gastrointestinal: soft, non-tender, non-distended.  MSK: 5/5 strength all muscle groups BLE, BUE Derm: bruising on bilateral arms Neuro: no focal neuro deficits Psych: appropriate, pleasant  Labs and Imaging: CBC BMET  Recent Labs  Lab 08/26/17 0955  WBC 9.7  HGB 10.2*  HCT 34.1*  PLT 253   Recent Labs  Lab 08/26/17 0955  NA 140  K 4.9  CL 105  CO2 27  BUN 14  CREATININE 1.10*  GLUCOSE 236*  CALCIUM 9.2     Dg Chest Port 1 View  Result Date: 08/26/2017 CLINICAL DATA:  Shortness of breath and chest pain. EXAM: PORTABLE CHEST 1 VIEW COMPARISON:  Chest x-ray dated Jul 24, 2017. FINDINGS: Unchanged left chest wall pacer device. Stable cardiomediastinal silhouette status post CABG. Diffuse, mildly increased interstitial markings throughout both lungs. No focal consolidation, pleural effusion, or  pneumothorax. No acute osseous abnormality. IMPRESSION: 1. New mild interstitial pulmonary edema. Electronically Signed   By: Titus Dubin M.D.   On: 08/26/2017 10:11   I have seen and evaluated the patient with Dr. Kris Mouton. I am in agreement with the note above in its revised form. My additions are in blue.  Marjie Skiff, MD Family Medicine, PGY-2   Guadalupe Dawn, MD 08/26/2017, 1:26 PM PGY-1, [place] Intern pager: [phone], text pages welcome&

## 2017-08-26 NOTE — ED Notes (Signed)
Took pt of BIPAP ; pt's 02 levels at 96%+ ; pt denies any further SOB at this time

## 2017-08-26 NOTE — ED Triage Notes (Signed)
Pt arrives by [facility] from home with CP that started this morning when she woke up. Pt has extensive cardiac history with CABG x2 and cardiac arrest in may at [facility]. Pt wears 2 L [place] oxygen at home. Pt was just discharged from rehab facility back to her home.  Pt was given 324 mg ASA and 2 SL nitro tablets en route.

## 2017-08-26 NOTE — ED Notes (Signed)
Pt states " I feel much better at this time "

## 2017-08-27 ENCOUNTER — Inpatient Hospital Stay (HOSPITAL_COMMUNITY): Payer: Medicare Other

## 2017-08-27 DIAGNOSIS — J9602 Acute respiratory failure with hypercapnia: Secondary | ICD-10-CM

## 2017-08-27 DIAGNOSIS — I509 Heart failure, unspecified: Secondary | ICD-10-CM

## 2017-08-27 DIAGNOSIS — J9601 Acute respiratory failure with hypoxia: Secondary | ICD-10-CM

## 2017-08-27 DIAGNOSIS — I340 Nonrheumatic mitral (valve) insufficiency: Secondary | ICD-10-CM

## 2017-08-27 LAB — CBC WITH DIFFERENTIAL/PLATELET
Abs Immature Granulocytes: 0 10*3/uL (ref 0.0–0.1)
Basophils Absolute: 0 10*3/uL (ref 0.0–0.1)
Basophils Relative: 0 %
EOS PCT: 1 %
Eosinophils Absolute: 0.1 10*3/uL (ref 0.0–0.7)
HEMATOCRIT: 27.3 % — AB (ref 36.0–46.0)
Hemoglobin: 8.4 g/dL — ABNORMAL LOW (ref 12.0–15.0)
Immature Granulocytes: 0 %
LYMPHS ABS: 1.3 10*3/uL (ref 0.7–4.0)
Lymphocytes Relative: 27 %
MCH: 29.3 pg (ref 26.0–34.0)
MCHC: 30.8 g/dL (ref 30.0–36.0)
MCV: 95.1 fL (ref 78.0–100.0)
MONOS PCT: 12 %
Monocytes Absolute: 0.6 10*3/uL (ref 0.1–1.0)
Neutro Abs: 2.9 10*3/uL (ref 1.7–7.7)
Neutrophils Relative %: 60 %
Platelets: 187 10*3/uL (ref 150–400)
RBC: 2.87 MIL/uL — ABNORMAL LOW (ref 3.87–5.11)
RDW: 14.6 % (ref 11.5–15.5)
WBC: 4.9 10*3/uL (ref 4.0–10.5)

## 2017-08-27 LAB — COMPREHENSIVE METABOLIC PANEL
ALT: 8 U/L — AB (ref 14–54)
AST: 11 U/L — AB (ref 15–41)
Albumin: 3 g/dL — ABNORMAL LOW (ref 3.5–5.0)
Alkaline Phosphatase: 98 U/L (ref 38–126)
Anion gap: 10 (ref 5–15)
BUN: 16 mg/dL (ref 6–20)
CHLORIDE: 103 mmol/L (ref 101–111)
CO2: 30 mmol/L (ref 22–32)
CREATININE: 1.05 mg/dL — AB (ref 0.44–1.00)
Calcium: 8.7 mg/dL — ABNORMAL LOW (ref 8.9–10.3)
GFR calc Af Amer: 57 mL/min — ABNORMAL LOW (ref >60)
GFR calc non Af Amer: 49 mL/min — ABNORMAL LOW (ref >60)
GLUCOSE: 92 mg/dL (ref 65–99)
Potassium: 3.6 mmol/L (ref 3.5–5.1)
SODIUM: 143 mmol/L (ref 135–145)
Total Bilirubin: 0.5 mg/dL (ref 0.3–1.2)
Total Protein: 5.1 g/dL — ABNORMAL LOW (ref 6.5–8.1)

## 2017-08-27 LAB — TROPONIN I: Troponin I: 0.04 ng/mL (ref <0.03)

## 2017-08-27 LAB — HEMOGLOBIN A1C
Hgb A1c MFr Bld: 5.6 % (ref 4.8–5.6)
Mean Plasma Glucose: 114.02 mg/dL

## 2017-08-27 LAB — ECHOCARDIOGRAM COMPLETE
Height: 64 in
Weight: 1961.21 oz

## 2017-08-27 LAB — MRSA PCR SCREENING: MRSA BY PCR: NEGATIVE

## 2017-08-27 MED ORDER — NITROGLYCERIN IN D5W 200-5 MCG/ML-% IV SOLN
INTRAVENOUS | Status: AC
  Filled 2017-08-27: qty 250

## 2017-08-27 MED ORDER — POLYETHYLENE GLYCOL 3350 17 G PO PACK
17.0000 g | PACK | Freq: Every day | ORAL | Status: DC
  Administered 2017-08-27: 17 g via ORAL
  Filled 2017-08-27 (×4): qty 1

## 2017-08-27 MED ORDER — TIOTROPIUM BROMIDE MONOHYDRATE 18 MCG IN CAPS
18.0000 ug | ORAL_CAPSULE | Freq: Every day | RESPIRATORY_TRACT | Status: DC
  Administered 2017-08-28 – 2017-08-30 (×3): 18 ug via RESPIRATORY_TRACT
  Filled 2017-08-27: qty 5

## 2017-08-27 MED ORDER — FUROSEMIDE 10 MG/ML IJ SOLN
40.0000 mg | Freq: Two times a day (BID) | INTRAMUSCULAR | Status: DC
  Administered 2017-08-27 – 2017-08-28 (×3): 40 mg via INTRAVENOUS
  Filled 2017-08-27 (×3): qty 4

## 2017-08-27 MED ORDER — SODIUM CHLORIDE 0.9 % IV BOLUS
1000.0000 mL | Freq: Once | INTRAVENOUS | Status: AC
  Administered 2017-08-27: 1000 mL via INTRAVENOUS

## 2017-08-27 NOTE — Plan of Care (Signed)
Patient has requested Tylenol for pain x 2 for pain, it has decreased her pain.  Patient did have a BM today. She takes Miralax daily at home.  We had it ordered this morning.

## 2017-08-27 NOTE — Progress Notes (Signed)
Family Medicine Teaching Service Daily Progress Note Intern Pager: [phone]  [name] Medical record number: [id number] Date of birth: 10-07-1934 Age: 83 y.o. Gender: female  Primary Care Provider: Cyndi Bender, PA-C Consultants: none Code Status: full  Pt Overview and Major Events to Date:  6/19 admitted to fpts  Assessment and Plan: Brandi Gardner is a 83 y.o. female presenting with shortness of breath likely due to chf exacerbation.   Shortness of breath (resolved) Likely 2/2 chf exacerbation. Has improved and is now off of bipap and is on home 2L of O2. Actually turned down to 1L and patient continued satting well. Likely 2/2 to decreased discharge dose. Will continue to diurese with 40mg  IV bid. Troponin stable at 0.03 x3. - cardiac monitoring - lasix 40mg  bid - supplemental o2 as needed - follow up echo - continue home spiriva, breo albuterol as needed - daily weight -strict I/O  COPD on home oxygen Shortness of breath doing much better. On home O2, actually able to decrease this am. Will continue breo and spiriva as controller meds. Albuterol prn.  CAD  CABG Patient with very complex cardiac history (ischemic cardiomyopathy, AICD, mitral valve insufficiency). Having some chest pain radiating to R her arm with some numbness and tingling at admission.  Stable troponins, looks to be stable ekg. - consider cardiology consult - daily ekg while admitted - continue telemetry  Broken ribs 2/2 recent CPR Healing broken ribs. Pain well controlled on tylenol. Will continue incentive spirometry. Can increase pain meds if needed. - Acetaminophen 650 mg q6 prn    HFrEF Present today with elevated BNP >900, pulmonary congestion with LE edema and crackles. Echo from OSH showing EF 25-30%. Patient on systolic heart failure regimen of coreg, lasix, imdur, spiro. Looks like she should have been on arb after discharge but this does not appear to be part of her  medication list. There is even mention of starting entresto. Will place on low dose lisinopril while heart would defer to outpatient cardiologist to start entresto. - Repeat ECHO - Lasix IV 40 mg bid - Strict I/O - Daily weights - contiunue lasix, coreg, lisinopril, spiro  Anxiety Patient reports being on Xanax 1 mg daily for "many years" was recently decrease to 0.25 mg. Patient reports anxiety is ot well controlled and would like to returned to previous dose. - 1mg  ativan bid as per home regimen - zoloft 50mg  daily  FEN/GI: heart healthy PPx: lovenox  Disposition: likely home  Subjective:  Feeling much better this am. Having some chest pain from broken ribs.  Objective: Temp:  [97.8 F (36.6 C)-98.6 F (37 C)] 97.8 F (36.6 C) (06/19 2324) Pulse Rate:  [76-132] 82 (06/20 0743) Resp:  [14-24] 19 (06/19 2324) BP: (92-158)/(49-104) 118/54 (06/20 0743) SpO2:  [90 %-100 %] 100 % (06/19 2324) FiO2 (%):  [32 %] 32 % (06/19 1451) Weight:  [122 lb 6.4 oz (55.5 kg)-122 lb 9.2 oz (55.6 kg)] 122 lb 9.2 oz (55.6 kg) (06/20 0500) Physical Exam: General: frail, elderly, caucasian female, resting comfortably on 2L [place] Cardiovascular: regular rate rhythm, no murmurs/rubs/gallops Respiratory: Improving crackles in bilateral lung bases Gastrointestinal: soft, non-tender, non-distended.  MSK: 5/5 strength all muscle groups BLE, BUE Derm: bruising on bilateral arms Neuro: no focal neuro deficits Psych: appropriate, pleasant  Laboratory: Recent Labs  Lab 08/26/17 0955 08/27/17 0252  WBC 9.7 4.9  HGB 10.2* 8.4*  HCT 34.1* 27.3*  PLT 253 187   Recent Labs  Lab 08/26/17 0955  08/27/17 0252  NA 140 143  K 4.9 3.6  CL 105 103  CO2 27 30  BUN 14 16  CREATININE 1.10* 1.05*  CALCIUM 9.2 8.7*  PROT  --  5.1*  BILITOT  --  0.5  ALKPHOS  --  98  ALT  --  8*  AST  --  11*  GLUCOSE 236* 92    Imaging/Diagnostic Tests: CLINICAL DATA:  Shortness of Breath  EXAM: PORTABLE CHEST 1  VIEW  COMPARISON:  08/26/2017  FINDINGS: Prior CABG. Left AICD remains in place, unchanged. Cardiomegaly. Improving interstitial prominence, likely improving edema. Minimal left basilar atelectasis. No visible effusions or acute bony abnormality.  IMPRESSION: Improving pulmonary edema pattern.  Cardiomegaly, left base atelectasis.   Electronically Signed   By: Rolm Baptise M.D.   On: 08/27/2017 08:39  Guadalupe Dawn, MD 08/27/2017, 9:27 AM PGY-1, [place] Intern pager: [phone], text pages welcome

## 2017-08-27 NOTE — Progress Notes (Signed)
  Echocardiogram 2D Echocardiogram has been performed.  Madelaine Etienne 08/27/2017, 12:48 PM

## 2017-08-28 DIAGNOSIS — I5023 Acute on chronic systolic (congestive) heart failure: Secondary | ICD-10-CM

## 2017-08-28 LAB — MAGNESIUM: MAGNESIUM: 1.8 mg/dL (ref 1.7–2.4)

## 2017-08-28 LAB — BASIC METABOLIC PANEL
Anion gap: 5 (ref 5–15)
BUN: 24 mg/dL — AB (ref 6–20)
CO2: 30 mmol/L (ref 22–32)
CREATININE: 1.23 mg/dL — AB (ref 0.44–1.00)
Calcium: 8.2 mg/dL — ABNORMAL LOW (ref 8.9–10.3)
Chloride: 105 mmol/L (ref 101–111)
GFR calc Af Amer: 47 mL/min — ABNORMAL LOW (ref >60)
GFR, EST NON AFRICAN AMERICAN: 40 mL/min — AB (ref >60)
Glucose, Bld: 138 mg/dL — ABNORMAL HIGH (ref 65–99)
Potassium: 3.7 mmol/L (ref 3.5–5.1)
SODIUM: 140 mmol/L (ref 135–145)

## 2017-08-28 MED ORDER — FLUTICASONE PROPIONATE 50 MCG/ACT NA SUSP
2.0000 | Freq: Every day | NASAL | Status: DC
  Administered 2017-08-28 – 2017-08-30 (×2): 2 via NASAL
  Filled 2017-08-28: qty 16

## 2017-08-28 MED ORDER — FERROUS SULFATE 325 (65 FE) MG PO TABS
325.0000 mg | ORAL_TABLET | Freq: Every day | ORAL | Status: DC
  Administered 2017-08-28 – 2017-08-30 (×3): 325 mg via ORAL
  Filled 2017-08-28 (×3): qty 1

## 2017-08-28 NOTE — Progress Notes (Signed)
Family Medicine Teaching Service Daily Progress Note Intern Pager: [phone]  [name] Medical record number: [id number] Date of birth: [birth year] Age: 83 y.o. Gender: female  Primary Care Provider: Cyndi Bender, PA-C Consultants: none Code Status: full  Pt Overview and Major Events to Date:  6/19 admitted to fpts  Assessment and Plan: Brandi Gardner is a 83 y.o. female presenting with shortness of breath likely due to chf exacerbation.   Shortness of breath (resolved) Likely 2/2 chf exacerbation. Had some hypotension on 6/20 and was given 1L of fluid. Patient now up to 125lbs from 122. Cr Stable from 1.0->1.2. Did have good diuresis per patient report although not reflected in chart. Will continue to diurese with 40mg  IV bid. Will hold imdur, spiro, and coreg 6/21. Already holding lisinopril, can add on meds if bp increases. Echo with ef 25-30% with diffuse hypokinesis. - cardiac monitoring - holding imdur, coreg, lisinopril, spiro - lasix 40mg  bid - continue home spiriva, breo, albuterol as needed  COPD on home oxygen Actually did fairly well on 1L. Did desat at night and while ambulating so will continue 2L. - cardiac monitoring - lasix 40mg  bid - supplemental o2 as needed - continue home spiriva, breo; albuterol as needed - daily weight - strict I/O  CAD  CABG Patient with very complex cardiac history (ischemic cardiomyopathy, AICD, mitral valve insufficiency). Having some chest pain radiating to R her arm with some numbness and tingling at admission.  Stable troponins, looks to be stable ekg. - consider cardiology consult - daily ekg while admitted - continue telemetry  Broken ribs 2/2 recent CPR Healing broken ribs. Pain well controlled on tylenol. Will continue incentive spirometry. Can increase pain meds if needed. - Acetaminophen 650 mg q6 prn    HFrEF Elevated BNP on admission. Echo with 25-30%. On appropriate regimen as outpatient although  lasix likely underdosed. Will hold bp meds 6/21 due to hypotension. Can re-introduce if hypertensive. - Lasix IV 40 mg bid - Strict I/O - Daily weights - holding coreg, lisinopril, spiro, imdur  Anxiety Patient reports being on Xanax 1 mg daily for "many years" was recently decrease to 0.25 mg. Patient reports anxiety is ot well controlled and would like to returned to previous dose. - 1mg  ativan bid as per home regimen - zoloft 50mg  daily  FEN/GI: heart healthy PPx: lovenox  Disposition: likely home  Subjective:  Upset this morning about this provider turning oxygen down on 6/20. Breathing is doing ok.  Objective: Temp:  [97.7 F (36.5 C)-98.7 F (37.1 C)] 98.7 F (37.1 C) (06/21 0723) Pulse Rate:  [71-84] 84 (06/21 0723) Resp:  [10-22] 20 (06/21 0723) BP: (77-140)/(37-99) 126/58 (06/21 0723) SpO2:  [98 %-100 %] 100 % (06/21 0723) Weight:  [125 lb 14.1 oz (57.1 kg)] 125 lb 14.1 oz (57.1 kg) (06/21 0500) Physical Exam: General: frail, elderly, caucasian female, resting comfortably on 2L [place] Cardiovascular: rrr, no m/r/g Respiratory:lungs clear to ausculation bilaterally, no increased work of breathing Gastrointestinal: soft, non-tender, non-distended.  MSK: 5/5 strength all muscle groups BLE, BUE Derm: bruising on bilateral arms Neuro: no focal neuro deficits Psych: appropriate, pleasant  Laboratory: Recent Labs  Lab 08/26/17 0955 08/27/17 0252  WBC 9.7 4.9  HGB 10.2* 8.4*  HCT 34.1* 27.3*  PLT 253 187   Recent Labs  Lab 08/26/17 0955 08/27/17 0252 08/28/17 0337  NA 140 143 140  K 4.9 3.6 3.7  CL 105 103 105  CO2 27 30 30   BUN 14 16  24*  CREATININE 1.10* 1.05* 1.23*  CALCIUM 9.2 8.7* 8.2*  PROT  --  5.1*  --   BILITOT  --  0.5  --   ALKPHOS  --  98  --   ALT  --  8*  --   AST  --  11*  --   GLUCOSE 236* 92 138*    Imaging/Diagnostic Tests: CLINICAL DATA:  Shortness of Breath  EXAM: PORTABLE CHEST 1 VIEW  COMPARISON:   08/26/2017  FINDINGS: Prior CABG. Left AICD remains in place, unchanged. Cardiomegaly. Improving interstitial prominence, likely improving edema. Minimal left basilar atelectasis. No visible effusions or acute bony abnormality.  IMPRESSION: Improving pulmonary edema pattern.  Cardiomegaly, left base atelectasis.   Electronically Signed   By: Rolm Baptise M.D.   On: 08/27/2017 08:39  Guadalupe Dawn, MD 08/28/2017, 9:38 AM PGY-1, [place] Intern pager: [phone], text pages welcome

## 2017-08-28 NOTE — Progress Notes (Signed)
Notified Family Med on call of patient in [place]. Potassium was 3.7 in AM and has received 80MG  IV Lasix today. He stated will check potassium level in the AM. Also I made him aware that patient has not had Mag level this admission. Will draw Mag level now. Will continue to monitor patient closely.

## 2017-08-29 LAB — CBC WITH DIFFERENTIAL/PLATELET
ABS IMMATURE GRANULOCYTES: 0 10*3/uL (ref 0.0–0.1)
BASOS ABS: 0 10*3/uL (ref 0.0–0.1)
Basophils Relative: 0 %
Eosinophils Absolute: 0.1 10*3/uL (ref 0.0–0.7)
Eosinophils Relative: 2 %
HCT: 26.4 % — ABNORMAL LOW (ref 36.0–46.0)
Hemoglobin: 8.1 g/dL — ABNORMAL LOW (ref 12.0–15.0)
IMMATURE GRANULOCYTES: 0 %
LYMPHS PCT: 29 %
Lymphs Abs: 1.4 10*3/uL (ref 0.7–4.0)
MCH: 29.1 pg (ref 26.0–34.0)
MCHC: 30.7 g/dL (ref 30.0–36.0)
MCV: 95 fL (ref 78.0–100.0)
MONOS PCT: 15 %
Monocytes Absolute: 0.7 10*3/uL (ref 0.1–1.0)
NEUTROS ABS: 2.6 10*3/uL (ref 1.7–7.7)
NEUTROS PCT: 54 %
Platelets: 189 10*3/uL (ref 150–400)
RBC: 2.78 MIL/uL — ABNORMAL LOW (ref 3.87–5.11)
RDW: 14.5 % (ref 11.5–15.5)
WBC: 4.8 10*3/uL (ref 4.0–10.5)

## 2017-08-29 LAB — BASIC METABOLIC PANEL
ANION GAP: 7 (ref 5–15)
BUN: 21 mg/dL — ABNORMAL HIGH (ref 6–20)
CALCIUM: 8.7 mg/dL — AB (ref 8.9–10.3)
CO2: 33 mmol/L — AB (ref 22–32)
Chloride: 102 mmol/L (ref 101–111)
Creatinine, Ser: 1.05 mg/dL — ABNORMAL HIGH (ref 0.44–1.00)
GFR, EST AFRICAN AMERICAN: 57 mL/min — AB (ref >60)
GFR, EST NON AFRICAN AMERICAN: 49 mL/min — AB (ref >60)
Glucose, Bld: 126 mg/dL — ABNORMAL HIGH (ref 65–99)
POTASSIUM: 3.5 mmol/L (ref 3.5–5.1)
Sodium: 142 mmol/L (ref 135–145)

## 2017-08-29 MED ORDER — FUROSEMIDE 40 MG PO TABS
40.0000 mg | ORAL_TABLET | Freq: Two times a day (BID) | ORAL | Status: DC
  Administered 2017-08-29 – 2017-08-30 (×3): 40 mg via ORAL
  Filled 2017-08-29 (×3): qty 1

## 2017-08-29 NOTE — Progress Notes (Signed)
Reported Mag level of 1.8 to Cardiology on call. No new orders given.

## 2017-08-29 NOTE — Progress Notes (Signed)
Family Medicine Teaching Service Daily Progress Note Intern Pager: [phone]  [name] Medical record number: [id number] Date of birth: August 19, 1934 Age: 83 y.o. Gender: female  Primary Care Provider: Cyndi Bender, PA-C Consultants: none Code Status: full  Pt Overview and Major Events to Date:  6/19 admitted to fpts  Assessment and Plan: Brandi Gardner is a 83 y.o. female presenting with shortness of breath likely due to chf exacerbation.   SOB 2/2 acute on chronic HFrEF  Patient with known HFrEF and lasix recently decreased at SNF prior to arrival.  Had some hypotension on 6/20 and was given 1L of fluid. Weight down 2 lbs last 24 hours, net out 950mL 6/21. Cr Stable from 1.0->1.2. Appears euvolemic today. Will hold imdur, spiro, and coreg 6/21. Already holding lisinopril, can add on meds if bp increases. Repeat cho with ef 25-30% with diffuse hypokinesis. - cardiac monitoring - holding imdur, coreg, lisinopril, spiro - CHANGE lasix to 40mg  BID PO  - continue home spiriva, breo, albuterol as needed  - BMP daily, no need for K repletion today as we are decreasing her effective lasix dose   COPD on home oxygen 2L  Documented desat while ambulating, uses 2L at home.  - supplemental o2 as needed - continue home spiriva, breo; albuterol as needed  CAD  CABG Patient with very complex cardiac history (ischemic cardiomyopathy, AICD, mitral valve insufficiency). CP on admission, trop neg, EKG unchanged, resolved pain. - continue ASA  Broken ribs 2/2 recent CPR Healing broken ribs. Pain well controlled on tylenol. Will continue incentive spirometry. Can increase pain meds if needed. - Acetaminophen 650 mg q6 prn   Anxiety Patient reports being on Xanax 1 mg daily for "many years" was recently decrease to 0.25 mg. Patient reports anxiety is not well controlled and would like to returned to previous dose. - 1mg  ativan bid as per home regimen - zoloft 50mg   daily  FEN/GI: heart healthy PPx: lovenox  Disposition: likely home  Subjective:  Patient feels well, endorses some chest pressure that feels sore when she presses on it, attributes to fractured ribs, does not feel like past MIs. Wants to go home when she is able, but worried about leaving too soon.   Objective: Temp:  [97.6 F (36.4 C)-98.3 F (36.8 C)] 98.3 F (36.8 C) (06/22 0719) Pulse Rate:  [77-87] 85 (06/22 0719) Resp:  [16-23] 23 (06/22 0719) BP: (106-129)/(42-73) 117/57 (06/22 0719) SpO2:  [97 %-100 %] 97 % (06/22 0740) Weight:  [123 lb 14.4 oz (56.2 kg)] 123 lb 14.4 oz (56.2 kg) (06/22 0550) Physical Exam: General: frail, elderly, caucasian female, resting comfortably on 2L [place] Cardiovascular: rrr, no m/r/g Respiratory:lungs clear to ausculation bilaterally, no increased work of breathing, no edema Gastrointestinal: soft, non-tender, non-distended.  MSK: 5/5 strength all muscle groups BLE, BUE Derm: bruising on bilateral arms Neuro: no focal neuro deficits Psych: appropriate, pleasant  Laboratory: Recent Labs  Lab 08/26/17 0955 08/27/17 0252 08/29/17 0244  WBC 9.7 4.9 4.8  HGB 10.2* 8.4* 8.1*  HCT 34.1* 27.3* 26.4*  PLT 253 187 189   Recent Labs  Lab 08/27/17 0252 08/28/17 0337 08/29/17 0244  NA 143 140 142  K 3.6 3.7 3.5  CL 103 105 102  CO2 30 30 33*  BUN 16 24* 21*  CREATININE 1.05* 1.23* 1.05*  CALCIUM 8.7* 8.2* 8.7*  PROT 5.1*  --   --   BILITOT 0.5  --   --   ALKPHOS 98  --   --  ALT 8*  --   --   AST 11*  --   --   GLUCOSE 92 138* 126*    Imaging/Diagnostic Tests: No new imaging.   Sela Hilding, MD 08/29/2017, 8:19 AM PGY-2, [place] Intern pager: [phone], text pages welcome

## 2017-08-29 NOTE — Evaluation (Signed)
Physical Therapy Evaluation Patient Details Name: Brandi Gardner MRN: [id number] DOB: 1934/04/15 Today's Date: 08/29/2017   History of Present Illness  83 y.o. female admitted with SOB likely due to CHF exacerbation. Pt recently home from SNF after cardiac arrest 2 weeks ago. PMH includes: Broken ribs from CPR, COPD on home O2, CABG, AICD, mitral valve insufficiency, HFrEF, HTN, HLD     Clinical Impression  Pt admitted with above diagnosis. Pt currently with functional limitations due to the deficits listed below (see PT Problem List). PTA, pt recently returned home form 2 week SNF stay. Pts daughter stays with her in 1 level apartment, avail 24/7 supervision. Upon eval pt presents with mild balance and weakness deficits , activity intolerance. Ambulating hallway 100' without AD or overt LOB, on 2L with SpO2 ranging 88-94% with activity. HR ranging 95-110 during visit. Patient is min guard/stand by assist level for all mobility at this time.  Pt will benefit from skilled PT to increase their independence and safety with mobility to allow discharge to the venue listed below.         Follow Up Recommendations Home health PT;Supervision/Assistance - 24 hour    Equipment Recommendations  None recommended by PT    Recommendations for Other Services       Precautions / Restrictions Precautions Precautions: Fall Restrictions Weight Bearing Restrictions: No      Mobility  Bed Mobility Overal bed mobility: Modified Independent                Transfers Overall transfer level: Modified independent Equipment used: None             General transfer comment: rises without assisatnce or AD  Ambulation/Gait Ambulation/Gait assistance: Min guard Gait Distance (Feet): 100 Feet Assistive device: None Gait Pattern/deviations: Step-to pattern;Step-through pattern Gait velocity: decreased   General Gait Details: Pt ambulating hallway without AD, mild unsteadiness noted but no  overt LOB. HR 95-139max during visit, SpO2 on 2L in high 90's. Pt tele box alarming for V-TACH, RN aware says widened QRS complexes but not V Tach. Pt reports feeling fine   Stairs            Wheelchair Mobility    Modified Rankin (Stroke Patients Only)       Balance Overall balance assessment: Mild deficits observed, not formally tested                                           Pertinent Vitals/Pain Pain Assessment: No/denies pain    Home Living Family/patient expects to be discharged to:: Private residence Living Arrangements: Children Available Help at Discharge: Available 24 hours/day Type of Home: Apartment Home Access: Level entry     Home Layout: One level Home Equipment: Walker - 2 wheels;Bedside commode;Shower seat;Grab bars - tub/shower;Grab bars - toilet      Prior Function Level of Independence: Needs assistance   Gait / Transfers Assistance Needed: limited community ambulation without AD, wear O2 2L   ADL's / Homemaking Assistance Needed: daugther helping with ADLs since returning from SNF        Hand Dominance        Extremity/Trunk Assessment   Upper Extremity Assessment Upper Extremity Assessment: Defer to OT evaluation    Lower Extremity Assessment Lower Extremity Assessment: Overall WFL for tasks assessed(BLE strength 4-/5)       Communication   Communication: No  difficulties  Cognition Arousal/Alertness: Awake/alert                                            General Comments      Exercises General Exercises - Lower Extremity Long Arc Quad: 10 reps   Assessment/Plan    PT Assessment Patient needs continued PT services  PT Problem List Decreased strength;Decreased range of motion;Decreased activity tolerance;Decreased balance;Decreased mobility;Cardiopulmonary status limiting activity       PT Treatment Interventions DME instruction;Gait training;Stair training;Functional mobility  training;Therapeutic activities;Therapeutic exercise;Balance training    PT Goals (Current goals can be found in the Care Plan section)  Acute Rehab PT Goals Patient Stated Goal: go home with daughters care PT Goal Formulation: With patient Time For Goal Achievement: 09/05/17 Potential to Achieve Goals: Good    Frequency Min 3X/week   Barriers to discharge        Co-evaluation               AM-PAC PT "6 Clicks" Daily Activity  Outcome Measure Difficulty turning over in bed (including adjusting bedclothes, sheets and blankets)?: None Difficulty moving from lying on back to sitting on the side of the bed? : None Difficulty sitting down on and standing up from a chair with arms (e.g., wheelchair, bedside commode, etc,.)?: None Help needed moving to and from a bed to chair (including a wheelchair)?: A Little Help needed walking in hospital room?: A Little Help needed climbing 3-5 steps with a railing? : A Little 6 Click Score: 21    End of Session Equipment Utilized During Treatment: Gait belt Activity Tolerance: Patient tolerated treatment well Patient left: in bed;with call bell/phone within reach Nurse Communication: Mobility status PT Visit Diagnosis: Unsteadiness on feet (R26.81);Difficulty in walking, not elsewhere classified (R26.2);Muscle weakness (generalized) (M62.81)    Time: 1240-1316 PT Time Calculation (min) (ACUTE ONLY): 36 min   Charges:   PT Evaluation $PT Eval Low Complexity: 1 Low PT Treatments $Gait Training: 8-22 mins   PT G Codes:        Reinaldo Berber, PT, DPT Acute Rehab Services Pager: [phone]    Reinaldo Berber 08/29/2017, 1:34 PM

## 2017-08-30 LAB — BASIC METABOLIC PANEL
Anion gap: 9 (ref 5–15)
BUN: 30 mg/dL — ABNORMAL HIGH (ref 6–20)
CALCIUM: 8.8 mg/dL — AB (ref 8.9–10.3)
CO2: 34 mmol/L — AB (ref 22–32)
CREATININE: 1.43 mg/dL — AB (ref 0.44–1.00)
Chloride: 97 mmol/L — ABNORMAL LOW (ref 101–111)
GFR calc non Af Amer: 34 mL/min — ABNORMAL LOW (ref >60)
GFR, EST AFRICAN AMERICAN: 39 mL/min — AB (ref >60)
Glucose, Bld: 144 mg/dL — ABNORMAL HIGH (ref 65–99)
Potassium: 3.7 mmol/L (ref 3.5–5.1)
Sodium: 140 mmol/L (ref 135–145)

## 2017-08-30 LAB — CBC
HEMATOCRIT: 26.3 % — AB (ref 36.0–46.0)
Hemoglobin: 8.2 g/dL — ABNORMAL LOW (ref 12.0–15.0)
MCH: 29.5 pg (ref 26.0–34.0)
MCHC: 31.2 g/dL (ref 30.0–36.0)
MCV: 94.6 fL (ref 78.0–100.0)
Platelets: 179 10*3/uL (ref 150–400)
RBC: 2.78 MIL/uL — ABNORMAL LOW (ref 3.87–5.11)
RDW: 14.5 % (ref 11.5–15.5)
WBC: 5.2 10*3/uL (ref 4.0–10.5)

## 2017-08-30 MED ORDER — FUROSEMIDE 40 MG PO TABS: 40.0000 mg | ORAL_TABLET | Freq: Two times a day (BID) | ORAL | 0 refills | Status: DC

## 2017-08-30 MED ORDER — ALPRAZOLAM 1 MG PO TABS: 1.0000 mg | ORAL_TABLET | Freq: Two times a day (BID) | ORAL | 0 refills | Status: AC | PRN

## 2017-08-30 NOTE — Care Management Note (Signed)
Case Management Note  Patient Details  Name: Brandi Gardner MRN: [id number] Date of Birth: 1934/10/27  Subjective/Objective:               Pt reports that she is active with [facility]     Action/Plan: HH orders and F2F faxed to [facility].   Expected Discharge Date:  08/30/17               Expected Discharge Plan:  [place]  In-House Referral:  NA  Discharge planning Services  CM Consult  Post Acute Care Choice:  Home Health Choice offered to:  Patient  DME Arranged:    DME Agency:     HH Arranged:  RN, PT [place] Agency:  [facility]  Status of Service:  Completed, signed off  If discussed at [place] of Stay Meetings, dates discussed:    Additional Comments:  Claudie Leach, RN 08/30/2017, 1:11 PM

## 2017-08-30 NOTE — Discharge Instructions (Signed)
You were admitted for shortness of breath likely to having to much fluid. We gave you a diuretic while you were here and you greatly improved. Your blood pressures were fairly low while you were here. For this reason I want you to stop taking the spironolactone, coreg, lisinopril, and imdur TEMPORARILY. I want you to schedule an appointment with your primary care physician on Wednesday or Thursday of this week. They will check your blood pressure and restart what is appropriate. Again, I want to reiterate that you are not stopping these permanently, you are only holding them for a couple of days to see what your pressure does. Also keep your follow up appointment with your cardiologist on 6/27.    Heart Failure Heart failure means your heart has trouble pumping blood. This makes it hard for your body to work well. Heart failure is usually a long-term (chronic) condition. You must take good care of yourself and follow your doctor's treatment plan. Follow these instructions at home:  Take your heart medicine as told by your doctor. ? Do not stop taking medicine unless your doctor tells you to. ? Do not skip any dose of medicine. ? Refill your medicines before they run out. ? Take other medicines only as told by your doctor or pharmacist.  Stay active if told by your doctor. The elderly and people with severe heart failure should talk with a doctor about physical activity.  Eat heart-healthy foods. Choose foods that are without trans fat and are low in saturated fat, cholesterol, and salt (sodium). This includes fresh or frozen fruits and vegetables, fish, lean meats, fat-free or low-fat dairy foods, whole grains, and high-fiber foods. Lentils and dried peas and beans (legumes) are also good choices.  Limit salt if told by your doctor.  Cook in a healthy way. Roast, grill, broil, bake, poach, steam, or stir-fry foods.  Limit fluids as told by your doctor.  Weigh yourself every morning. Do this  after you pee (urinate) and before you eat breakfast. Write down your weight to give to your doctor.  Take your blood pressure and write it down if your doctor tells you to.  Ask your doctor how to check your pulse. Check your pulse as told.  Lose weight if told by your doctor.  Stop smoking or chewing tobacco. Do not use gum or patches that help you quit without your doctor's approval.  Schedule and go to doctor visits as told.  Nonpregnant women should have no more than 1 drink a day. Men should have no more than 2 drinks a day. Talk to your doctor about drinking alcohol.  Stop illegal drug use.  Stay current with shots (immunizations).  Manage your health conditions as told by your doctor.  Learn to manage your stress.  Rest when you are tired.  If it is really hot outside: ? Avoid intense activities. ? Use air conditioning or fans, or get in a cooler place. ? Avoid caffeine and alcohol. ? Wear loose-fitting, lightweight, and light-colored clothing.  If it is really cold outside: ? Avoid intense activities. ? Layer your clothing. ? Wear mittens or gloves, a hat, and a scarf when going outside. ? Avoid alcohol.  Learn about heart failure and get support as needed.  Get help to maintain or improve your quality of life and your ability to care for yourself as needed. Contact a doctor if:  You gain weight quickly.  You are more short of breath than usual.  You cannot  do your normal activities.  You tire easily.  You cough more than normal, especially with activity.  You have any or more puffiness (swelling) in areas such as your hands, feet, ankles, or belly (abdomen).  You cannot sleep because it is hard to breathe.  You feel like your heart is beating fast (palpitations).  You get dizzy or light-headed when you stand up. Get help right away if:  You have trouble breathing.  There is a change in mental status, such as becoming less alert or not being able  to focus.  You have chest pain or discomfort.  You faint. This information is not intended to replace advice given to you by your health care provider. Make sure you discuss any questions you have with your health care provider. Document Released: 12/04/2007 Document Revised: 08/02/2015 Document Reviewed: 04/12/2012 Elsevier Interactive Patient Education  2017 [employer].

## 2017-08-30 NOTE — Discharge Summary (Signed)
[facility] Discharge Summary  [name] Medical record number: [id number] Date of birth: Sep 19, 1934 Age: 83 y.o. Gender: female Date of Admission: 08/26/2017  Date of Discharge: 08/30/2017 Admitting Physician: Alveda Reasons, MD  Primary Care Provider: Cyndi Bender, PA-C Consultants: none  Indication for Hospitalization: shortness of breath  Discharge Diagnoses/Problem List:  Shortness of breath 2/2 acute on chronic HFrEF COPD on home oxygen 2L CAD H/O CABG Broken ribs 2/2 recent cpr Anxiety  Disposition: home with hh pt  Discharge Condition: stable  Discharge Exam: General:frail, elderly, caucasian female, resting comfortably on 2L [place] Cardiovascular:rrr, no m/r/g Respiratory:lungs clear to ausculation bilaterally, no increased work of breathing, no edema Gastrointestinal:soft, non-tender, non-distended. MSK:5/5 strength all muscle groups BLE, BUE Derm:bruising on bilateral arms Neuro:no focal neuro deficits Psych:appropriate, pleasant  Brief Hospital Course:  83 year old who presented on 6/19 due to shortness of breath. Initially needed bipap. BNP > 900 and had 1+ pitting edema up to shins and crackles in lungs. Received lasix 40mg  which improved her respiratory status considerably. She was admitted for chf exacerbation. She received lasix 40mg  IV bid every day after admission until 6/22, when she was transitioned to 40mg  oral bid. Patient was discharged on this dose. Patient was on her home 2L of oxygen, weight stable at 125lbs, and down a net of 1.3L. She had an echo which was essentially stable at ef of 25-30%, G1DD.  HFrEF Patient with HFrEF. On lasix, lisinopril, imdur, spiro, and coreg as outpatient. [place] had hypotension during the entirety of admission. 106/64 on last bp prior to discharge. For this reason all of her home bp meds except for the lasix were held. Likely will need to be restarted as patient's BP  increases as outpatient. She is to schedule follow up with pcp on 6/26 for bp check. Can add on medications as necessary.  Issues for Follow Up:  1. Measure blood pressure, if elevated can restart home bp medications 2. If not elevated or is low, would likely continue to hold these meds 3. Follow up work of breathing 4. Consider checking bmp 2/2 increased dose of lasix 5. Monitor patient's anxiety  Significant Procedures: none  Significant Labs and Imaging:  Recent Labs  Lab 08/27/17 0252 08/29/17 0244 08/30/17 0326  WBC 4.9 4.8 5.2  HGB 8.4* 8.1* 8.2*  HCT 27.3* 26.4* 26.3*  PLT 187 189 179   Recent Labs  Lab 08/26/17 0955 08/27/17 0252 08/28/17 0337 08/28/17 2156 08/29/17 0244 08/30/17 0326  NA 140 143 140  --  142 140  K 4.9 3.6 3.7  --  3.5 3.7  CL 105 103 105  --  102 97*  CO2 27 30 30   --  33* 34*  GLUCOSE 236* 92 138*  --  126* 144*  BUN 14 16 24*  --  21* 30*  CREATININE 1.10* 1.05* 1.23*  --  1.05* 1.43*  CALCIUM 9.2 8.7* 8.2*  --  8.7* 8.8*  MG  --   --   --  1.8  --   --   ALKPHOS  --  98  --   --   --   --   AST  --  11*  --   --   --   --   ALT  --  8*  --   --   --   --   ALBUMIN  --  3.0*  --   --   --   --  Results/Tests Pending at Time of Discharge:   Discharge Medications:  Allergies as of 08/30/2017      Reactions   Benadryl [diphenhydramine] Shortness Of Breath, Other (See Comments)   COPD   Penicillins Other (See Comments)   Headache, childhood allergy   Potassium-containing Compounds Other (See Comments)   Mouth pains   Percocet [oxycodone-acetaminophen] Hives   Vicodin [hydrocodone-acetaminophen] Hives   Potassium    Jaw pain   Statins Nausea Only   Tramadol Itching      Medication List    STOP taking these medications   carvedilol 12.5 MG tablet Commonly known as:  COREG   cyanocobalamin 1000 MCG/ML injection Commonly known as:  (VITAMIN B-12)   isosorbide mononitrate 30 MG 24 hr tablet Commonly known as:  IMDUR    lisinopril 2.5 MG tablet Commonly known as:  PRINIVIL,ZESTRIL   spironolactone 25 MG tablet Commonly known as:  ALDACTONE     TAKE these medications   acetaminophen 500 MG tablet Commonly known as:  TYLENOL Take 500 mg by mouth every 6 (six) hours as needed for moderate pain or headache.   albuterol 108 (90 Base) MCG/ACT inhaler Commonly known as:  PROVENTIL HFA;VENTOLIN HFA Inhale 2 puffs into the lungs every 6 (six) hours as needed for wheezing or shortness of breath.   albuterol (2.5 MG/3ML) 0.083% nebulizer solution Commonly known as:  PROVENTIL Take 2.5 mg by nebulization every 6 (six) hours as needed for wheezing or shortness of breath.   ALPRAZolam 1 MG tablet Commonly known as:  XANAX Take 1 tablet (1 mg total) by mouth 2 (two) times daily as needed for anxiety. What changed:    medication strength  See the new instructions.   aspirin EC 81 MG tablet Take 81 mg by mouth daily.   D3-50 50000 units capsule Generic drug:  Cholecalciferol Take 50,000 Units by mouth once a week. Tuesday   ferrous sulfate 324 (65 Fe) MG Tbec Take 1 tablet by mouth daily.   fluticasone 110 MCG/ACT inhaler Commonly known as:  FLOVENT HFA Inhale 2 puffs into the lungs daily.   fluticasone 50 MCG/ACT nasal spray Commonly known as:  FLONASE Place 2 sprays into both nostrils daily.   fluticasone furoate-vilanterol 200-25 MCG/INH Aepb Commonly known as:  BREO ELLIPTA Inhale 1 puff into the lungs daily.   furosemide 40 MG tablet Commonly known as:  LASIX Take 1 tablet (40 mg total) by mouth 2 (two) times daily. What changed:    when to take this  additional instructions   nitroGLYCERIN 0.4 MG SL tablet Commonly known as:  NITROSTAT Place 0.4 mg under the tongue every 5 (five) minutes as needed for chest pain.   pantoprazole 40 MG tablet Commonly known as:  PROTONIX Take 40 mg by mouth daily.   sertraline 50 MG tablet Commonly known as:  ZOLOFT Take 50 mg by mouth  daily.   tiotropium 18 MCG inhalation capsule Commonly known as:  SPIRIVA Place 18 mcg into inhaler and inhale daily.   tiZANidine 4 MG tablet Commonly known as:  ZANAFLEX Take 4 mg by mouth every 8 (eight) hours as needed (Back Pain).       Discharge Instructions: Please refer to Patient Instructions section of EMR for full details.  Patient was counseled important signs and symptoms that should prompt return to medical care, changes in medications, dietary instructions, activity restrictions, and follow up appointments.   Follow-Up Appointments: Needs to follow up with PCP on 6/26, card follow up scheduled  for 6/27.   Guadalupe Dawn, MD 08/30/2017, 6:57 PM PGY-1, [place]

## 2017-08-31 LAB — CULTURE, BLOOD (ROUTINE X 2)
Culture: NO GROWTH
Culture: NO GROWTH
Special Requests: ADEQUATE
Special Requests: ADEQUATE

## 2017-09-01 DIAGNOSIS — I130 Hypertensive heart and chronic kidney disease with heart failure and stage 1 through stage 4 chronic kidney disease, or unspecified chronic kidney disease: Secondary | ICD-10-CM | POA: Diagnosis not present

## 2017-09-01 DIAGNOSIS — I482 Chronic atrial fibrillation: Secondary | ICD-10-CM | POA: Diagnosis not present

## 2017-09-01 DIAGNOSIS — N183 Chronic kidney disease, stage 3 (moderate): Secondary | ICD-10-CM | POA: Diagnosis not present

## 2017-09-01 DIAGNOSIS — J449 Chronic obstructive pulmonary disease, unspecified: Secondary | ICD-10-CM | POA: Diagnosis not present

## 2017-09-01 DIAGNOSIS — E1122 Type 2 diabetes mellitus with diabetic chronic kidney disease: Secondary | ICD-10-CM | POA: Diagnosis not present

## 2017-09-01 DIAGNOSIS — I2510 Atherosclerotic heart disease of native coronary artery without angina pectoris: Secondary | ICD-10-CM | POA: Diagnosis not present

## 2017-09-02 DIAGNOSIS — I513 Intracardiac thrombosis, not elsewhere classified: Secondary | ICD-10-CM | POA: Diagnosis not present

## 2017-09-02 DIAGNOSIS — D649 Anemia, unspecified: Secondary | ICD-10-CM | POA: Diagnosis not present

## 2017-09-02 DIAGNOSIS — I509 Heart failure, unspecified: Secondary | ICD-10-CM | POA: Diagnosis not present

## 2017-09-02 DIAGNOSIS — J9621 Acute and chronic respiratory failure with hypoxia: Secondary | ICD-10-CM | POA: Diagnosis not present

## 2017-09-02 DIAGNOSIS — I2510 Atherosclerotic heart disease of native coronary artery without angina pectoris: Secondary | ICD-10-CM | POA: Diagnosis not present

## 2017-09-03 DIAGNOSIS — I255 Ischemic cardiomyopathy: Secondary | ICD-10-CM | POA: Diagnosis not present

## 2017-09-03 DIAGNOSIS — Z95810 Presence of automatic (implantable) cardiac defibrillator: Secondary | ICD-10-CM | POA: Diagnosis not present

## 2017-09-03 DIAGNOSIS — Z4502 Encounter for adjustment and management of automatic implantable cardiac defibrillator: Secondary | ICD-10-CM | POA: Diagnosis not present

## 2017-09-03 DIAGNOSIS — I5023 Acute on chronic systolic (congestive) heart failure: Secondary | ICD-10-CM | POA: Diagnosis not present

## 2017-09-04 DIAGNOSIS — J9621 Acute and chronic respiratory failure with hypoxia: Secondary | ICD-10-CM | POA: Diagnosis not present

## 2017-09-04 DIAGNOSIS — F5101 Primary insomnia: Secondary | ICD-10-CM | POA: Diagnosis not present

## 2017-09-04 DIAGNOSIS — I509 Heart failure, unspecified: Secondary | ICD-10-CM | POA: Diagnosis not present

## 2017-09-04 DIAGNOSIS — I513 Intracardiac thrombosis, not elsewhere classified: Secondary | ICD-10-CM | POA: Diagnosis not present

## 2017-09-07 DIAGNOSIS — I130 Hypertensive heart and chronic kidney disease with heart failure and stage 1 through stage 4 chronic kidney disease, or unspecified chronic kidney disease: Secondary | ICD-10-CM | POA: Diagnosis not present

## 2017-09-07 DIAGNOSIS — E1122 Type 2 diabetes mellitus with diabetic chronic kidney disease: Secondary | ICD-10-CM | POA: Diagnosis not present

## 2017-09-07 DIAGNOSIS — N183 Chronic kidney disease, stage 3 (moderate): Secondary | ICD-10-CM | POA: Diagnosis not present

## 2017-09-07 DIAGNOSIS — I2510 Atherosclerotic heart disease of native coronary artery without angina pectoris: Secondary | ICD-10-CM | POA: Diagnosis not present

## 2017-09-07 DIAGNOSIS — J449 Chronic obstructive pulmonary disease, unspecified: Secondary | ICD-10-CM | POA: Diagnosis not present

## 2017-09-07 DIAGNOSIS — I482 Chronic atrial fibrillation: Secondary | ICD-10-CM | POA: Diagnosis not present

## 2017-09-08 DIAGNOSIS — Z9181 History of falling: Secondary | ICD-10-CM | POA: Diagnosis not present

## 2017-09-08 DIAGNOSIS — I513 Intracardiac thrombosis, not elsewhere classified: Secondary | ICD-10-CM | POA: Diagnosis not present

## 2017-09-08 DIAGNOSIS — J449 Chronic obstructive pulmonary disease, unspecified: Secondary | ICD-10-CM | POA: Diagnosis not present

## 2017-09-08 DIAGNOSIS — I10 Essential (primary) hypertension: Secondary | ICD-10-CM | POA: Diagnosis not present

## 2017-09-08 DIAGNOSIS — I2510 Atherosclerotic heart disease of native coronary artery without angina pectoris: Secondary | ICD-10-CM | POA: Diagnosis not present

## 2017-09-08 DIAGNOSIS — I130 Hypertensive heart and chronic kidney disease with heart failure and stage 1 through stage 4 chronic kidney disease, or unspecified chronic kidney disease: Secondary | ICD-10-CM | POA: Diagnosis not present

## 2017-09-08 DIAGNOSIS — E1122 Type 2 diabetes mellitus with diabetic chronic kidney disease: Secondary | ICD-10-CM | POA: Diagnosis not present

## 2017-09-08 DIAGNOSIS — I482 Chronic atrial fibrillation: Secondary | ICD-10-CM | POA: Diagnosis not present

## 2017-09-08 DIAGNOSIS — Z1331 Encounter for screening for depression: Secondary | ICD-10-CM | POA: Diagnosis not present

## 2017-09-08 DIAGNOSIS — J9621 Acute and chronic respiratory failure with hypoxia: Secondary | ICD-10-CM | POA: Diagnosis not present

## 2017-09-08 DIAGNOSIS — Z1339 Encounter for screening examination for other mental health and behavioral disorders: Secondary | ICD-10-CM | POA: Diagnosis not present

## 2017-09-08 DIAGNOSIS — N183 Chronic kidney disease, stage 3 (moderate): Secondary | ICD-10-CM | POA: Diagnosis not present

## 2017-09-10 DIAGNOSIS — J449 Chronic obstructive pulmonary disease, unspecified: Secondary | ICD-10-CM | POA: Diagnosis not present

## 2017-09-10 DIAGNOSIS — F419 Anxiety disorder, unspecified: Secondary | ICD-10-CM | POA: Diagnosis not present

## 2017-09-10 DIAGNOSIS — I5022 Chronic systolic (congestive) heart failure: Secondary | ICD-10-CM | POA: Diagnosis not present

## 2017-09-10 DIAGNOSIS — J9602 Acute respiratory failure with hypercapnia: Secondary | ICD-10-CM | POA: Diagnosis not present

## 2017-09-10 DIAGNOSIS — Z79899 Other long term (current) drug therapy: Secondary | ICD-10-CM | POA: Diagnosis not present

## 2017-09-10 DIAGNOSIS — I5023 Acute on chronic systolic (congestive) heart failure: Secondary | ICD-10-CM | POA: Diagnosis not present

## 2017-09-10 DIAGNOSIS — R404 Transient alteration of awareness: Secondary | ICD-10-CM | POA: Diagnosis not present

## 2017-09-10 DIAGNOSIS — Z9981 Dependence on supplemental oxygen: Secondary | ICD-10-CM | POA: Diagnosis not present

## 2017-09-10 DIAGNOSIS — R279 Unspecified lack of coordination: Secondary | ICD-10-CM | POA: Diagnosis not present

## 2017-09-10 DIAGNOSIS — I340 Nonrheumatic mitral (valve) insufficiency: Secondary | ICD-10-CM | POA: Diagnosis not present

## 2017-09-10 DIAGNOSIS — R0602 Shortness of breath: Secondary | ICD-10-CM | POA: Diagnosis not present

## 2017-09-10 DIAGNOSIS — Z951 Presence of aortocoronary bypass graft: Secondary | ICD-10-CM | POA: Diagnosis not present

## 2017-09-10 DIAGNOSIS — I272 Pulmonary hypertension, unspecified: Secondary | ICD-10-CM | POA: Diagnosis not present

## 2017-09-10 DIAGNOSIS — R062 Wheezing: Secondary | ICD-10-CM | POA: Diagnosis not present

## 2017-09-10 DIAGNOSIS — K219 Gastro-esophageal reflux disease without esophagitis: Secondary | ICD-10-CM | POA: Diagnosis not present

## 2017-09-10 DIAGNOSIS — J189 Pneumonia, unspecified organism: Secondary | ICD-10-CM | POA: Diagnosis not present

## 2017-09-10 DIAGNOSIS — I509 Heart failure, unspecified: Secondary | ICD-10-CM | POA: Diagnosis not present

## 2017-09-10 DIAGNOSIS — R4020 Unspecified coma: Secondary | ICD-10-CM | POA: Diagnosis not present

## 2017-09-10 DIAGNOSIS — I2510 Atherosclerotic heart disease of native coronary artery without angina pectoris: Secondary | ICD-10-CM | POA: Diagnosis not present

## 2017-09-10 DIAGNOSIS — R918 Other nonspecific abnormal finding of lung field: Secondary | ICD-10-CM | POA: Diagnosis not present

## 2017-09-10 DIAGNOSIS — I350 Nonrheumatic aortic (valve) stenosis: Secondary | ICD-10-CM | POA: Diagnosis not present

## 2017-09-10 DIAGNOSIS — A419 Sepsis, unspecified organism: Secondary | ICD-10-CM | POA: Diagnosis not present

## 2017-09-10 DIAGNOSIS — I5021 Acute systolic (congestive) heart failure: Secondary | ICD-10-CM | POA: Diagnosis not present

## 2017-09-10 DIAGNOSIS — Z95810 Presence of automatic (implantable) cardiac defibrillator: Secondary | ICD-10-CM | POA: Diagnosis not present

## 2017-09-10 DIAGNOSIS — Z7401 Bed confinement status: Secondary | ICD-10-CM | POA: Diagnosis not present

## 2017-09-10 DIAGNOSIS — N183 Chronic kidney disease, stage 3 (moderate): Secondary | ICD-10-CM | POA: Diagnosis present

## 2017-09-10 DIAGNOSIS — J9601 Acute respiratory failure with hypoxia: Secondary | ICD-10-CM | POA: Diagnosis not present

## 2017-09-10 DIAGNOSIS — E785 Hyperlipidemia, unspecified: Secondary | ICD-10-CM | POA: Diagnosis not present

## 2017-09-10 DIAGNOSIS — Z452 Encounter for adjustment and management of vascular access device: Secondary | ICD-10-CM | POA: Diagnosis not present

## 2017-09-10 DIAGNOSIS — R000 Tachycardia, unspecified: Secondary | ICD-10-CM | POA: Diagnosis not present

## 2017-09-10 DIAGNOSIS — J9621 Acute and chronic respiratory failure with hypoxia: Secondary | ICD-10-CM | POA: Diagnosis not present

## 2017-09-10 DIAGNOSIS — J9600 Acute respiratory failure, unspecified whether with hypoxia or hypercapnia: Secondary | ICD-10-CM | POA: Diagnosis not present

## 2017-09-10 DIAGNOSIS — R54 Age-related physical debility: Secondary | ICD-10-CM | POA: Diagnosis present

## 2017-09-10 DIAGNOSIS — J9811 Atelectasis: Secondary | ICD-10-CM | POA: Diagnosis not present

## 2017-09-10 DIAGNOSIS — I130 Hypertensive heart and chronic kidney disease with heart failure and stage 1 through stage 4 chronic kidney disease, or unspecified chronic kidney disease: Secondary | ICD-10-CM | POA: Diagnosis not present

## 2017-09-10 DIAGNOSIS — I10 Essential (primary) hypertension: Secondary | ICD-10-CM | POA: Diagnosis not present

## 2017-09-10 DIAGNOSIS — J90 Pleural effusion, not elsewhere classified: Secondary | ICD-10-CM | POA: Diagnosis not present

## 2017-09-10 DIAGNOSIS — J431 Panlobular emphysema: Secondary | ICD-10-CM | POA: Diagnosis not present

## 2017-09-10 DIAGNOSIS — R61 Generalized hyperhidrosis: Secondary | ICD-10-CM | POA: Diagnosis not present

## 2017-09-10 DIAGNOSIS — R4182 Altered mental status, unspecified: Secondary | ICD-10-CM | POA: Diagnosis not present

## 2017-09-11 DIAGNOSIS — I5021 Acute systolic (congestive) heart failure: Secondary | ICD-10-CM

## 2017-09-11 DIAGNOSIS — I2510 Atherosclerotic heart disease of native coronary artery without angina pectoris: Secondary | ICD-10-CM

## 2017-09-11 DIAGNOSIS — R4182 Altered mental status, unspecified: Secondary | ICD-10-CM

## 2017-09-12 DIAGNOSIS — J9600 Acute respiratory failure, unspecified whether with hypoxia or hypercapnia: Secondary | ICD-10-CM

## 2017-09-14 DIAGNOSIS — J449 Chronic obstructive pulmonary disease, unspecified: Secondary | ICD-10-CM

## 2017-09-17 DIAGNOSIS — I5022 Chronic systolic (congestive) heart failure: Secondary | ICD-10-CM | POA: Diagnosis not present

## 2017-09-17 DIAGNOSIS — K219 Gastro-esophageal reflux disease without esophagitis: Secondary | ICD-10-CM | POA: Diagnosis not present

## 2017-09-17 DIAGNOSIS — I272 Pulmonary hypertension, unspecified: Secondary | ICD-10-CM | POA: Diagnosis not present

## 2017-09-17 DIAGNOSIS — J431 Panlobular emphysema: Secondary | ICD-10-CM | POA: Diagnosis not present

## 2017-09-18 DIAGNOSIS — R4182 Altered mental status, unspecified: Secondary | ICD-10-CM | POA: Diagnosis not present

## 2017-09-18 DIAGNOSIS — I10 Essential (primary) hypertension: Secondary | ICD-10-CM | POA: Diagnosis not present

## 2017-09-18 DIAGNOSIS — J449 Chronic obstructive pulmonary disease, unspecified: Secondary | ICD-10-CM | POA: Diagnosis not present

## 2017-09-18 DIAGNOSIS — I517 Cardiomegaly: Secondary | ICD-10-CM | POA: Diagnosis not present

## 2017-09-18 DIAGNOSIS — I5022 Chronic systolic (congestive) heart failure: Secondary | ICD-10-CM | POA: Diagnosis not present

## 2017-09-18 DIAGNOSIS — J9602 Acute respiratory failure with hypercapnia: Secondary | ICD-10-CM | POA: Diagnosis not present

## 2017-09-18 DIAGNOSIS — J9601 Acute respiratory failure with hypoxia: Secondary | ICD-10-CM | POA: Diagnosis not present

## 2017-09-18 DIAGNOSIS — Z95810 Presence of automatic (implantable) cardiac defibrillator: Secondary | ICD-10-CM | POA: Diagnosis not present

## 2017-09-18 DIAGNOSIS — E785 Hyperlipidemia, unspecified: Secondary | ICD-10-CM | POA: Diagnosis not present

## 2017-09-18 DIAGNOSIS — J9621 Acute and chronic respiratory failure with hypoxia: Secondary | ICD-10-CM | POA: Diagnosis not present

## 2017-09-18 DIAGNOSIS — F419 Anxiety disorder, unspecified: Secondary | ICD-10-CM | POA: Diagnosis not present

## 2017-09-18 DIAGNOSIS — R279 Unspecified lack of coordination: Secondary | ICD-10-CM | POA: Diagnosis not present

## 2017-09-18 DIAGNOSIS — J431 Panlobular emphysema: Secondary | ICD-10-CM | POA: Diagnosis not present

## 2017-09-18 DIAGNOSIS — I5021 Acute systolic (congestive) heart failure: Secondary | ICD-10-CM | POA: Diagnosis not present

## 2017-09-18 DIAGNOSIS — I272 Pulmonary hypertension, unspecified: Secondary | ICD-10-CM | POA: Diagnosis not present

## 2017-09-18 DIAGNOSIS — M436 Torticollis: Secondary | ICD-10-CM | POA: Diagnosis not present

## 2017-09-18 DIAGNOSIS — K219 Gastro-esophageal reflux disease without esophagitis: Secondary | ICD-10-CM | POA: Diagnosis not present

## 2017-09-18 DIAGNOSIS — I255 Ischemic cardiomyopathy: Secondary | ICD-10-CM | POA: Diagnosis not present

## 2017-09-18 DIAGNOSIS — R05 Cough: Secondary | ICD-10-CM | POA: Diagnosis not present

## 2017-09-18 DIAGNOSIS — Z7401 Bed confinement status: Secondary | ICD-10-CM | POA: Diagnosis not present

## 2017-09-18 DIAGNOSIS — N183 Chronic kidney disease, stage 3 (moderate): Secondary | ICD-10-CM | POA: Diagnosis not present

## 2017-09-21 DIAGNOSIS — F419 Anxiety disorder, unspecified: Secondary | ICD-10-CM | POA: Diagnosis not present

## 2017-09-22 ENCOUNTER — Telehealth: Payer: Self-pay | Admitting: Pulmonary Disease

## 2017-09-22 NOTE — Telephone Encounter (Signed)
Noted. Will send to Dr. Vaughan Browner as an Juluis Rainier

## 2017-09-25 DIAGNOSIS — I5022 Chronic systolic (congestive) heart failure: Secondary | ICD-10-CM | POA: Diagnosis not present

## 2017-09-25 DIAGNOSIS — J431 Panlobular emphysema: Secondary | ICD-10-CM | POA: Diagnosis not present

## 2017-09-25 DIAGNOSIS — I255 Ischemic cardiomyopathy: Secondary | ICD-10-CM | POA: Diagnosis not present

## 2017-09-25 DIAGNOSIS — I272 Pulmonary hypertension, unspecified: Secondary | ICD-10-CM | POA: Diagnosis not present

## 2017-09-28 DIAGNOSIS — J449 Chronic obstructive pulmonary disease, unspecified: Secondary | ICD-10-CM | POA: Diagnosis not present

## 2017-09-28 DIAGNOSIS — M436 Torticollis: Secondary | ICD-10-CM | POA: Diagnosis not present

## 2017-09-28 DIAGNOSIS — F419 Anxiety disorder, unspecified: Secondary | ICD-10-CM | POA: Diagnosis not present

## 2017-09-28 DIAGNOSIS — I5022 Chronic systolic (congestive) heart failure: Secondary | ICD-10-CM | POA: Diagnosis not present

## 2017-10-01 DIAGNOSIS — N183 Chronic kidney disease, stage 3 (moderate): Secondary | ICD-10-CM | POA: Diagnosis not present

## 2017-10-01 DIAGNOSIS — I255 Ischemic cardiomyopathy: Secondary | ICD-10-CM | POA: Diagnosis not present

## 2017-10-01 DIAGNOSIS — I5022 Chronic systolic (congestive) heart failure: Secondary | ICD-10-CM | POA: Diagnosis not present

## 2017-10-01 DIAGNOSIS — I272 Pulmonary hypertension, unspecified: Secondary | ICD-10-CM | POA: Diagnosis not present

## 2017-10-05 DIAGNOSIS — J449 Chronic obstructive pulmonary disease, unspecified: Secondary | ICD-10-CM | POA: Diagnosis not present

## 2017-10-05 DIAGNOSIS — E1122 Type 2 diabetes mellitus with diabetic chronic kidney disease: Secondary | ICD-10-CM | POA: Diagnosis not present

## 2017-10-05 DIAGNOSIS — I2510 Atherosclerotic heart disease of native coronary artery without angina pectoris: Secondary | ICD-10-CM | POA: Diagnosis not present

## 2017-10-05 DIAGNOSIS — N183 Chronic kidney disease, stage 3 (moderate): Secondary | ICD-10-CM | POA: Diagnosis not present

## 2017-10-05 DIAGNOSIS — I482 Chronic atrial fibrillation: Secondary | ICD-10-CM | POA: Diagnosis not present

## 2017-10-05 DIAGNOSIS — I130 Hypertensive heart and chronic kidney disease with heart failure and stage 1 through stage 4 chronic kidney disease, or unspecified chronic kidney disease: Secondary | ICD-10-CM | POA: Diagnosis not present

## 2017-10-07 DIAGNOSIS — I130 Hypertensive heart and chronic kidney disease with heart failure and stage 1 through stage 4 chronic kidney disease, or unspecified chronic kidney disease: Secondary | ICD-10-CM | POA: Diagnosis not present

## 2017-10-07 DIAGNOSIS — J449 Chronic obstructive pulmonary disease, unspecified: Secondary | ICD-10-CM | POA: Diagnosis not present

## 2017-10-07 DIAGNOSIS — E1122 Type 2 diabetes mellitus with diabetic chronic kidney disease: Secondary | ICD-10-CM | POA: Diagnosis not present

## 2017-10-07 DIAGNOSIS — I482 Chronic atrial fibrillation: Secondary | ICD-10-CM | POA: Diagnosis not present

## 2017-10-07 DIAGNOSIS — N183 Chronic kidney disease, stage 3 (moderate): Secondary | ICD-10-CM | POA: Diagnosis not present

## 2017-10-07 DIAGNOSIS — I2510 Atherosclerotic heart disease of native coronary artery without angina pectoris: Secondary | ICD-10-CM | POA: Diagnosis not present

## 2017-10-08 DIAGNOSIS — J9621 Acute and chronic respiratory failure with hypoxia: Secondary | ICD-10-CM | POA: Diagnosis not present

## 2017-10-08 DIAGNOSIS — I513 Intracardiac thrombosis, not elsewhere classified: Secondary | ICD-10-CM | POA: Diagnosis not present

## 2017-10-08 DIAGNOSIS — I10 Essential (primary) hypertension: Secondary | ICD-10-CM | POA: Diagnosis not present

## 2017-10-08 DIAGNOSIS — I2510 Atherosclerotic heart disease of native coronary artery without angina pectoris: Secondary | ICD-10-CM | POA: Diagnosis not present

## 2017-10-12 DIAGNOSIS — I130 Hypertensive heart and chronic kidney disease with heart failure and stage 1 through stage 4 chronic kidney disease, or unspecified chronic kidney disease: Secondary | ICD-10-CM | POA: Diagnosis not present

## 2017-10-12 DIAGNOSIS — I2510 Atherosclerotic heart disease of native coronary artery without angina pectoris: Secondary | ICD-10-CM | POA: Diagnosis not present

## 2017-10-12 DIAGNOSIS — I482 Chronic atrial fibrillation: Secondary | ICD-10-CM | POA: Diagnosis not present

## 2017-10-12 DIAGNOSIS — E1122 Type 2 diabetes mellitus with diabetic chronic kidney disease: Secondary | ICD-10-CM | POA: Diagnosis not present

## 2017-10-12 DIAGNOSIS — N183 Chronic kidney disease, stage 3 (moderate): Secondary | ICD-10-CM | POA: Diagnosis not present

## 2017-10-12 DIAGNOSIS — J449 Chronic obstructive pulmonary disease, unspecified: Secondary | ICD-10-CM | POA: Diagnosis not present

## 2017-10-16 DIAGNOSIS — I482 Chronic atrial fibrillation: Secondary | ICD-10-CM | POA: Diagnosis not present

## 2017-10-16 DIAGNOSIS — N183 Chronic kidney disease, stage 3 (moderate): Secondary | ICD-10-CM | POA: Diagnosis not present

## 2017-10-16 DIAGNOSIS — E1122 Type 2 diabetes mellitus with diabetic chronic kidney disease: Secondary | ICD-10-CM | POA: Diagnosis not present

## 2017-10-16 DIAGNOSIS — I130 Hypertensive heart and chronic kidney disease with heart failure and stage 1 through stage 4 chronic kidney disease, or unspecified chronic kidney disease: Secondary | ICD-10-CM | POA: Diagnosis not present

## 2017-10-16 DIAGNOSIS — J449 Chronic obstructive pulmonary disease, unspecified: Secondary | ICD-10-CM | POA: Diagnosis not present

## 2017-10-16 DIAGNOSIS — I2510 Atherosclerotic heart disease of native coronary artery without angina pectoris: Secondary | ICD-10-CM | POA: Diagnosis not present

## 2017-10-21 DIAGNOSIS — I2510 Atherosclerotic heart disease of native coronary artery without angina pectoris: Secondary | ICD-10-CM | POA: Diagnosis not present

## 2017-10-21 DIAGNOSIS — N183 Chronic kidney disease, stage 3 (moderate): Secondary | ICD-10-CM | POA: Diagnosis not present

## 2017-10-21 DIAGNOSIS — J449 Chronic obstructive pulmonary disease, unspecified: Secondary | ICD-10-CM | POA: Diagnosis not present

## 2017-10-21 DIAGNOSIS — E1122 Type 2 diabetes mellitus with diabetic chronic kidney disease: Secondary | ICD-10-CM | POA: Diagnosis not present

## 2017-10-21 DIAGNOSIS — I482 Chronic atrial fibrillation: Secondary | ICD-10-CM | POA: Diagnosis not present

## 2017-10-21 DIAGNOSIS — I130 Hypertensive heart and chronic kidney disease with heart failure and stage 1 through stage 4 chronic kidney disease, or unspecified chronic kidney disease: Secondary | ICD-10-CM | POA: Diagnosis not present

## 2017-10-22 DIAGNOSIS — F419 Anxiety disorder, unspecified: Secondary | ICD-10-CM | POA: Diagnosis not present

## 2017-10-22 DIAGNOSIS — I255 Ischemic cardiomyopathy: Secondary | ICD-10-CM | POA: Diagnosis not present

## 2017-10-22 DIAGNOSIS — Z87440 Personal history of urinary (tract) infections: Secondary | ICD-10-CM | POA: Diagnosis not present

## 2017-10-22 DIAGNOSIS — I2510 Atherosclerotic heart disease of native coronary artery without angina pectoris: Secondary | ICD-10-CM | POA: Diagnosis not present

## 2017-10-22 DIAGNOSIS — Z7982 Long term (current) use of aspirin: Secondary | ICD-10-CM | POA: Diagnosis not present

## 2017-10-22 DIAGNOSIS — J9621 Acute and chronic respiratory failure with hypoxia: Secondary | ICD-10-CM | POA: Diagnosis not present

## 2017-10-22 DIAGNOSIS — I130 Hypertensive heart and chronic kidney disease with heart failure and stage 1 through stage 4 chronic kidney disease, or unspecified chronic kidney disease: Secondary | ICD-10-CM | POA: Diagnosis not present

## 2017-10-22 DIAGNOSIS — Z95810 Presence of automatic (implantable) cardiac defibrillator: Secondary | ICD-10-CM | POA: Diagnosis not present

## 2017-10-22 DIAGNOSIS — I5023 Acute on chronic systolic (congestive) heart failure: Secondary | ICD-10-CM | POA: Diagnosis not present

## 2017-10-22 DIAGNOSIS — Z87891 Personal history of nicotine dependence: Secondary | ICD-10-CM | POA: Diagnosis not present

## 2017-10-22 DIAGNOSIS — I513 Intracardiac thrombosis, not elsewhere classified: Secondary | ICD-10-CM | POA: Diagnosis not present

## 2017-10-22 DIAGNOSIS — I482 Chronic atrial fibrillation: Secondary | ICD-10-CM | POA: Diagnosis not present

## 2017-10-22 DIAGNOSIS — J449 Chronic obstructive pulmonary disease, unspecified: Secondary | ICD-10-CM | POA: Diagnosis not present

## 2017-10-22 DIAGNOSIS — Z951 Presence of aortocoronary bypass graft: Secondary | ICD-10-CM | POA: Diagnosis not present

## 2017-10-22 DIAGNOSIS — E1122 Type 2 diabetes mellitus with diabetic chronic kidney disease: Secondary | ICD-10-CM | POA: Diagnosis not present

## 2017-10-22 DIAGNOSIS — J9611 Chronic respiratory failure with hypoxia: Secondary | ICD-10-CM | POA: Diagnosis not present

## 2017-10-22 DIAGNOSIS — J9612 Chronic respiratory failure with hypercapnia: Secondary | ICD-10-CM | POA: Diagnosis not present

## 2017-10-22 DIAGNOSIS — N183 Chronic kidney disease, stage 3 (moderate): Secondary | ICD-10-CM | POA: Diagnosis not present

## 2017-10-22 DIAGNOSIS — Z7951 Long term (current) use of inhaled steroids: Secondary | ICD-10-CM | POA: Diagnosis not present

## 2017-10-22 DIAGNOSIS — Z9981 Dependence on supplemental oxygen: Secondary | ICD-10-CM | POA: Diagnosis not present

## 2017-10-22 DIAGNOSIS — Z9181 History of falling: Secondary | ICD-10-CM | POA: Diagnosis not present

## 2017-10-22 DIAGNOSIS — I10 Essential (primary) hypertension: Secondary | ICD-10-CM | POA: Diagnosis not present

## 2017-10-22 DIAGNOSIS — E538 Deficiency of other specified B group vitamins: Secondary | ICD-10-CM | POA: Diagnosis not present

## 2017-11-03 DIAGNOSIS — E1122 Type 2 diabetes mellitus with diabetic chronic kidney disease: Secondary | ICD-10-CM | POA: Diagnosis not present

## 2017-11-03 DIAGNOSIS — I130 Hypertensive heart and chronic kidney disease with heart failure and stage 1 through stage 4 chronic kidney disease, or unspecified chronic kidney disease: Secondary | ICD-10-CM | POA: Diagnosis not present

## 2017-11-03 DIAGNOSIS — I5023 Acute on chronic systolic (congestive) heart failure: Secondary | ICD-10-CM | POA: Diagnosis not present

## 2017-11-03 DIAGNOSIS — N183 Chronic kidney disease, stage 3 (moderate): Secondary | ICD-10-CM | POA: Diagnosis not present

## 2017-11-03 DIAGNOSIS — J9612 Chronic respiratory failure with hypercapnia: Secondary | ICD-10-CM | POA: Diagnosis not present

## 2017-11-03 DIAGNOSIS — J449 Chronic obstructive pulmonary disease, unspecified: Secondary | ICD-10-CM | POA: Diagnosis not present

## 2017-11-05 DIAGNOSIS — E538 Deficiency of other specified B group vitamins: Secondary | ICD-10-CM | POA: Diagnosis not present

## 2017-11-05 DIAGNOSIS — H6013 Cellulitis of external ear, bilateral: Secondary | ICD-10-CM | POA: Diagnosis not present

## 2017-11-05 DIAGNOSIS — I513 Intracardiac thrombosis, not elsewhere classified: Secondary | ICD-10-CM | POA: Diagnosis not present

## 2017-11-05 DIAGNOSIS — J9621 Acute and chronic respiratory failure with hypoxia: Secondary | ICD-10-CM | POA: Diagnosis not present

## 2017-11-11 DIAGNOSIS — E1122 Type 2 diabetes mellitus with diabetic chronic kidney disease: Secondary | ICD-10-CM | POA: Diagnosis not present

## 2017-11-11 DIAGNOSIS — J449 Chronic obstructive pulmonary disease, unspecified: Secondary | ICD-10-CM | POA: Diagnosis not present

## 2017-11-11 DIAGNOSIS — I5023 Acute on chronic systolic (congestive) heart failure: Secondary | ICD-10-CM | POA: Diagnosis not present

## 2017-11-11 DIAGNOSIS — N183 Chronic kidney disease, stage 3 (moderate): Secondary | ICD-10-CM | POA: Diagnosis not present

## 2017-11-11 DIAGNOSIS — I130 Hypertensive heart and chronic kidney disease with heart failure and stage 1 through stage 4 chronic kidney disease, or unspecified chronic kidney disease: Secondary | ICD-10-CM | POA: Diagnosis not present

## 2017-11-11 DIAGNOSIS — J9612 Chronic respiratory failure with hypercapnia: Secondary | ICD-10-CM | POA: Diagnosis not present

## 2017-11-18 DIAGNOSIS — N183 Chronic kidney disease, stage 3 (moderate): Secondary | ICD-10-CM | POA: Diagnosis not present

## 2017-11-18 DIAGNOSIS — I5023 Acute on chronic systolic (congestive) heart failure: Secondary | ICD-10-CM | POA: Diagnosis not present

## 2017-11-18 DIAGNOSIS — I130 Hypertensive heart and chronic kidney disease with heart failure and stage 1 through stage 4 chronic kidney disease, or unspecified chronic kidney disease: Secondary | ICD-10-CM | POA: Diagnosis not present

## 2017-11-18 DIAGNOSIS — E1122 Type 2 diabetes mellitus with diabetic chronic kidney disease: Secondary | ICD-10-CM | POA: Diagnosis not present

## 2017-11-18 DIAGNOSIS — J449 Chronic obstructive pulmonary disease, unspecified: Secondary | ICD-10-CM | POA: Diagnosis not present

## 2017-11-18 DIAGNOSIS — J9612 Chronic respiratory failure with hypercapnia: Secondary | ICD-10-CM | POA: Diagnosis not present

## 2017-11-24 DIAGNOSIS — I255 Ischemic cardiomyopathy: Secondary | ICD-10-CM | POA: Diagnosis not present

## 2017-11-24 DIAGNOSIS — Z95810 Presence of automatic (implantable) cardiac defibrillator: Secondary | ICD-10-CM | POA: Diagnosis not present

## 2017-11-26 DIAGNOSIS — J9621 Acute and chronic respiratory failure with hypoxia: Secondary | ICD-10-CM | POA: Diagnosis not present

## 2017-11-26 DIAGNOSIS — I2510 Atherosclerotic heart disease of native coronary artery without angina pectoris: Secondary | ICD-10-CM | POA: Diagnosis not present

## 2017-11-26 DIAGNOSIS — I513 Intracardiac thrombosis, not elsewhere classified: Secondary | ICD-10-CM | POA: Diagnosis not present

## 2017-11-26 DIAGNOSIS — E538 Deficiency of other specified B group vitamins: Secondary | ICD-10-CM | POA: Diagnosis not present

## 2017-12-02 DIAGNOSIS — I130 Hypertensive heart and chronic kidney disease with heart failure and stage 1 through stage 4 chronic kidney disease, or unspecified chronic kidney disease: Secondary | ICD-10-CM | POA: Diagnosis not present

## 2017-12-02 DIAGNOSIS — I5023 Acute on chronic systolic (congestive) heart failure: Secondary | ICD-10-CM | POA: Diagnosis not present

## 2017-12-02 DIAGNOSIS — E1122 Type 2 diabetes mellitus with diabetic chronic kidney disease: Secondary | ICD-10-CM | POA: Diagnosis not present

## 2017-12-02 DIAGNOSIS — J449 Chronic obstructive pulmonary disease, unspecified: Secondary | ICD-10-CM | POA: Diagnosis not present

## 2017-12-02 DIAGNOSIS — J9612 Chronic respiratory failure with hypercapnia: Secondary | ICD-10-CM | POA: Diagnosis not present

## 2017-12-02 DIAGNOSIS — N183 Chronic kidney disease, stage 3 (moderate): Secondary | ICD-10-CM | POA: Diagnosis not present

## 2017-12-04 DIAGNOSIS — Z4502 Encounter for adjustment and management of automatic implantable cardiac defibrillator: Secondary | ICD-10-CM | POA: Diagnosis not present

## 2017-12-07 DIAGNOSIS — H60393 Other infective otitis externa, bilateral: Secondary | ICD-10-CM | POA: Diagnosis not present

## 2017-12-07 DIAGNOSIS — J9621 Acute and chronic respiratory failure with hypoxia: Secondary | ICD-10-CM | POA: Diagnosis not present

## 2017-12-07 DIAGNOSIS — H6693 Otitis media, unspecified, bilateral: Secondary | ICD-10-CM | POA: Diagnosis not present

## 2017-12-07 DIAGNOSIS — I513 Intracardiac thrombosis, not elsewhere classified: Secondary | ICD-10-CM | POA: Diagnosis not present

## 2017-12-16 DIAGNOSIS — I513 Intracardiac thrombosis, not elsewhere classified: Secondary | ICD-10-CM | POA: Diagnosis not present

## 2017-12-16 DIAGNOSIS — H6121 Impacted cerumen, right ear: Secondary | ICD-10-CM | POA: Diagnosis not present

## 2017-12-16 DIAGNOSIS — J9611 Chronic respiratory failure with hypoxia: Secondary | ICD-10-CM | POA: Diagnosis not present

## 2017-12-16 DIAGNOSIS — I2510 Atherosclerotic heart disease of native coronary artery without angina pectoris: Secondary | ICD-10-CM | POA: Diagnosis not present

## 2017-12-17 DIAGNOSIS — J9612 Chronic respiratory failure with hypercapnia: Secondary | ICD-10-CM | POA: Diagnosis not present

## 2017-12-17 DIAGNOSIS — I5023 Acute on chronic systolic (congestive) heart failure: Secondary | ICD-10-CM | POA: Diagnosis not present

## 2017-12-17 DIAGNOSIS — E1122 Type 2 diabetes mellitus with diabetic chronic kidney disease: Secondary | ICD-10-CM | POA: Diagnosis not present

## 2017-12-17 DIAGNOSIS — N183 Chronic kidney disease, stage 3 (moderate): Secondary | ICD-10-CM | POA: Diagnosis not present

## 2017-12-17 DIAGNOSIS — I130 Hypertensive heart and chronic kidney disease with heart failure and stage 1 through stage 4 chronic kidney disease, or unspecified chronic kidney disease: Secondary | ICD-10-CM | POA: Diagnosis not present

## 2017-12-17 DIAGNOSIS — J449 Chronic obstructive pulmonary disease, unspecified: Secondary | ICD-10-CM | POA: Diagnosis not present

## 2017-12-18 DIAGNOSIS — J9612 Chronic respiratory failure with hypercapnia: Secondary | ICD-10-CM | POA: Diagnosis not present

## 2017-12-18 DIAGNOSIS — N183 Chronic kidney disease, stage 3 (moderate): Secondary | ICD-10-CM | POA: Diagnosis not present

## 2017-12-18 DIAGNOSIS — E1122 Type 2 diabetes mellitus with diabetic chronic kidney disease: Secondary | ICD-10-CM | POA: Diagnosis not present

## 2017-12-18 DIAGNOSIS — I5023 Acute on chronic systolic (congestive) heart failure: Secondary | ICD-10-CM | POA: Diagnosis not present

## 2017-12-18 DIAGNOSIS — J449 Chronic obstructive pulmonary disease, unspecified: Secondary | ICD-10-CM | POA: Diagnosis not present

## 2017-12-18 DIAGNOSIS — I130 Hypertensive heart and chronic kidney disease with heart failure and stage 1 through stage 4 chronic kidney disease, or unspecified chronic kidney disease: Secondary | ICD-10-CM | POA: Diagnosis not present

## 2017-12-21 DIAGNOSIS — N183 Chronic kidney disease, stage 3 (moderate): Secondary | ICD-10-CM | POA: Diagnosis not present

## 2017-12-21 DIAGNOSIS — I5023 Acute on chronic systolic (congestive) heart failure: Secondary | ICD-10-CM | POA: Diagnosis not present

## 2017-12-21 DIAGNOSIS — Z9181 History of falling: Secondary | ICD-10-CM | POA: Diagnosis not present

## 2017-12-21 DIAGNOSIS — J9611 Chronic respiratory failure with hypoxia: Secondary | ICD-10-CM | POA: Diagnosis not present

## 2017-12-21 DIAGNOSIS — Z95810 Presence of automatic (implantable) cardiac defibrillator: Secondary | ICD-10-CM | POA: Diagnosis not present

## 2017-12-21 DIAGNOSIS — I130 Hypertensive heart and chronic kidney disease with heart failure and stage 1 through stage 4 chronic kidney disease, or unspecified chronic kidney disease: Secondary | ICD-10-CM | POA: Diagnosis not present

## 2017-12-21 DIAGNOSIS — I255 Ischemic cardiomyopathy: Secondary | ICD-10-CM | POA: Diagnosis not present

## 2017-12-21 DIAGNOSIS — Z951 Presence of aortocoronary bypass graft: Secondary | ICD-10-CM | POA: Diagnosis not present

## 2017-12-21 DIAGNOSIS — Z87440 Personal history of urinary (tract) infections: Secondary | ICD-10-CM | POA: Diagnosis not present

## 2017-12-21 DIAGNOSIS — I482 Chronic atrial fibrillation, unspecified: Secondary | ICD-10-CM | POA: Diagnosis not present

## 2017-12-21 DIAGNOSIS — Z9981 Dependence on supplemental oxygen: Secondary | ICD-10-CM | POA: Diagnosis not present

## 2017-12-21 DIAGNOSIS — Z87891 Personal history of nicotine dependence: Secondary | ICD-10-CM | POA: Diagnosis not present

## 2017-12-21 DIAGNOSIS — J9612 Chronic respiratory failure with hypercapnia: Secondary | ICD-10-CM | POA: Diagnosis not present

## 2017-12-21 DIAGNOSIS — I2510 Atherosclerotic heart disease of native coronary artery without angina pectoris: Secondary | ICD-10-CM | POA: Diagnosis not present

## 2017-12-21 DIAGNOSIS — F419 Anxiety disorder, unspecified: Secondary | ICD-10-CM | POA: Diagnosis not present

## 2017-12-21 DIAGNOSIS — Z7982 Long term (current) use of aspirin: Secondary | ICD-10-CM | POA: Diagnosis not present

## 2017-12-21 DIAGNOSIS — E1122 Type 2 diabetes mellitus with diabetic chronic kidney disease: Secondary | ICD-10-CM | POA: Diagnosis not present

## 2017-12-21 DIAGNOSIS — Z7951 Long term (current) use of inhaled steroids: Secondary | ICD-10-CM | POA: Diagnosis not present

## 2017-12-21 DIAGNOSIS — J449 Chronic obstructive pulmonary disease, unspecified: Secondary | ICD-10-CM | POA: Diagnosis not present

## 2017-12-21 DIAGNOSIS — S51802A Unspecified open wound of left forearm, initial encounter: Secondary | ICD-10-CM | POA: Diagnosis not present

## 2017-12-23 DIAGNOSIS — I130 Hypertensive heart and chronic kidney disease with heart failure and stage 1 through stage 4 chronic kidney disease, or unspecified chronic kidney disease: Secondary | ICD-10-CM | POA: Diagnosis not present

## 2017-12-23 DIAGNOSIS — N183 Chronic kidney disease, stage 3 (moderate): Secondary | ICD-10-CM | POA: Diagnosis not present

## 2017-12-23 DIAGNOSIS — I5023 Acute on chronic systolic (congestive) heart failure: Secondary | ICD-10-CM | POA: Diagnosis not present

## 2017-12-23 DIAGNOSIS — E1122 Type 2 diabetes mellitus with diabetic chronic kidney disease: Secondary | ICD-10-CM | POA: Diagnosis not present

## 2017-12-23 DIAGNOSIS — S51802A Unspecified open wound of left forearm, initial encounter: Secondary | ICD-10-CM | POA: Diagnosis not present

## 2017-12-23 DIAGNOSIS — J449 Chronic obstructive pulmonary disease, unspecified: Secondary | ICD-10-CM | POA: Diagnosis not present

## 2017-12-25 DIAGNOSIS — S51802A Unspecified open wound of left forearm, initial encounter: Secondary | ICD-10-CM | POA: Diagnosis not present

## 2017-12-25 DIAGNOSIS — I5023 Acute on chronic systolic (congestive) heart failure: Secondary | ICD-10-CM | POA: Diagnosis not present

## 2017-12-25 DIAGNOSIS — E1122 Type 2 diabetes mellitus with diabetic chronic kidney disease: Secondary | ICD-10-CM | POA: Diagnosis not present

## 2017-12-25 DIAGNOSIS — J449 Chronic obstructive pulmonary disease, unspecified: Secondary | ICD-10-CM | POA: Diagnosis not present

## 2017-12-25 DIAGNOSIS — I130 Hypertensive heart and chronic kidney disease with heart failure and stage 1 through stage 4 chronic kidney disease, or unspecified chronic kidney disease: Secondary | ICD-10-CM | POA: Diagnosis not present

## 2017-12-25 DIAGNOSIS — N183 Chronic kidney disease, stage 3 (moderate): Secondary | ICD-10-CM | POA: Diagnosis not present

## 2017-12-28 DIAGNOSIS — I513 Intracardiac thrombosis, not elsewhere classified: Secondary | ICD-10-CM | POA: Diagnosis not present

## 2017-12-28 DIAGNOSIS — E538 Deficiency of other specified B group vitamins: Secondary | ICD-10-CM | POA: Diagnosis not present

## 2017-12-28 DIAGNOSIS — J9611 Chronic respiratory failure with hypoxia: Secondary | ICD-10-CM | POA: Diagnosis not present

## 2017-12-28 DIAGNOSIS — E46 Unspecified protein-calorie malnutrition: Secondary | ICD-10-CM | POA: Diagnosis not present

## 2017-12-29 DIAGNOSIS — J449 Chronic obstructive pulmonary disease, unspecified: Secondary | ICD-10-CM | POA: Diagnosis not present

## 2017-12-29 DIAGNOSIS — E1122 Type 2 diabetes mellitus with diabetic chronic kidney disease: Secondary | ICD-10-CM | POA: Diagnosis not present

## 2017-12-29 DIAGNOSIS — S51802A Unspecified open wound of left forearm, initial encounter: Secondary | ICD-10-CM | POA: Diagnosis not present

## 2017-12-29 DIAGNOSIS — I130 Hypertensive heart and chronic kidney disease with heart failure and stage 1 through stage 4 chronic kidney disease, or unspecified chronic kidney disease: Secondary | ICD-10-CM | POA: Diagnosis not present

## 2017-12-29 DIAGNOSIS — N183 Chronic kidney disease, stage 3 (moderate): Secondary | ICD-10-CM | POA: Diagnosis not present

## 2017-12-29 DIAGNOSIS — I5023 Acute on chronic systolic (congestive) heart failure: Secondary | ICD-10-CM | POA: Diagnosis not present

## 2018-01-07 DIAGNOSIS — E1122 Type 2 diabetes mellitus with diabetic chronic kidney disease: Secondary | ICD-10-CM | POA: Diagnosis not present

## 2018-01-07 DIAGNOSIS — S51802A Unspecified open wound of left forearm, initial encounter: Secondary | ICD-10-CM | POA: Diagnosis not present

## 2018-01-07 DIAGNOSIS — N183 Chronic kidney disease, stage 3 (moderate): Secondary | ICD-10-CM | POA: Diagnosis not present

## 2018-01-07 DIAGNOSIS — I5023 Acute on chronic systolic (congestive) heart failure: Secondary | ICD-10-CM | POA: Diagnosis not present

## 2018-01-07 DIAGNOSIS — J449 Chronic obstructive pulmonary disease, unspecified: Secondary | ICD-10-CM | POA: Diagnosis not present

## 2018-01-07 DIAGNOSIS — I130 Hypertensive heart and chronic kidney disease with heart failure and stage 1 through stage 4 chronic kidney disease, or unspecified chronic kidney disease: Secondary | ICD-10-CM | POA: Diagnosis not present

## 2018-01-20 DIAGNOSIS — N183 Chronic kidney disease, stage 3 (moderate): Secondary | ICD-10-CM | POA: Diagnosis not present

## 2018-01-20 DIAGNOSIS — S51802A Unspecified open wound of left forearm, initial encounter: Secondary | ICD-10-CM | POA: Diagnosis not present

## 2018-01-20 DIAGNOSIS — J449 Chronic obstructive pulmonary disease, unspecified: Secondary | ICD-10-CM | POA: Diagnosis not present

## 2018-01-20 DIAGNOSIS — I130 Hypertensive heart and chronic kidney disease with heart failure and stage 1 through stage 4 chronic kidney disease, or unspecified chronic kidney disease: Secondary | ICD-10-CM | POA: Diagnosis not present

## 2018-01-20 DIAGNOSIS — E1122 Type 2 diabetes mellitus with diabetic chronic kidney disease: Secondary | ICD-10-CM | POA: Diagnosis not present

## 2018-01-20 DIAGNOSIS — I5023 Acute on chronic systolic (congestive) heart failure: Secondary | ICD-10-CM | POA: Diagnosis not present

## 2018-01-26 DIAGNOSIS — E46 Unspecified protein-calorie malnutrition: Secondary | ICD-10-CM | POA: Diagnosis not present

## 2018-01-26 DIAGNOSIS — I10 Essential (primary) hypertension: Secondary | ICD-10-CM | POA: Diagnosis not present

## 2018-01-26 DIAGNOSIS — E785 Hyperlipidemia, unspecified: Secondary | ICD-10-CM | POA: Diagnosis not present

## 2018-01-26 DIAGNOSIS — E538 Deficiency of other specified B group vitamins: Secondary | ICD-10-CM | POA: Diagnosis not present

## 2018-01-26 DIAGNOSIS — J208 Acute bronchitis due to other specified organisms: Secondary | ICD-10-CM | POA: Diagnosis not present

## 2018-01-26 DIAGNOSIS — I513 Intracardiac thrombosis, not elsewhere classified: Secondary | ICD-10-CM | POA: Diagnosis not present

## 2018-01-29 DIAGNOSIS — E1122 Type 2 diabetes mellitus with diabetic chronic kidney disease: Secondary | ICD-10-CM | POA: Diagnosis not present

## 2018-01-29 DIAGNOSIS — S51802A Unspecified open wound of left forearm, initial encounter: Secondary | ICD-10-CM | POA: Diagnosis not present

## 2018-01-29 DIAGNOSIS — I130 Hypertensive heart and chronic kidney disease with heart failure and stage 1 through stage 4 chronic kidney disease, or unspecified chronic kidney disease: Secondary | ICD-10-CM | POA: Diagnosis not present

## 2018-01-29 DIAGNOSIS — I5023 Acute on chronic systolic (congestive) heart failure: Secondary | ICD-10-CM | POA: Diagnosis not present

## 2018-01-29 DIAGNOSIS — J449 Chronic obstructive pulmonary disease, unspecified: Secondary | ICD-10-CM | POA: Diagnosis not present

## 2018-01-29 DIAGNOSIS — N183 Chronic kidney disease, stage 3 (moderate): Secondary | ICD-10-CM | POA: Diagnosis not present

## 2018-02-02 DIAGNOSIS — N183 Chronic kidney disease, stage 3 (moderate): Secondary | ICD-10-CM | POA: Diagnosis not present

## 2018-02-02 DIAGNOSIS — E1122 Type 2 diabetes mellitus with diabetic chronic kidney disease: Secondary | ICD-10-CM | POA: Diagnosis not present

## 2018-02-02 DIAGNOSIS — I5023 Acute on chronic systolic (congestive) heart failure: Secondary | ICD-10-CM | POA: Diagnosis not present

## 2018-02-02 DIAGNOSIS — I130 Hypertensive heart and chronic kidney disease with heart failure and stage 1 through stage 4 chronic kidney disease, or unspecified chronic kidney disease: Secondary | ICD-10-CM | POA: Diagnosis not present

## 2018-02-02 DIAGNOSIS — J449 Chronic obstructive pulmonary disease, unspecified: Secondary | ICD-10-CM | POA: Diagnosis not present

## 2018-02-02 DIAGNOSIS — S51802A Unspecified open wound of left forearm, initial encounter: Secondary | ICD-10-CM | POA: Diagnosis not present

## 2018-02-03 DIAGNOSIS — I255 Ischemic cardiomyopathy: Secondary | ICD-10-CM | POA: Diagnosis not present

## 2018-02-03 DIAGNOSIS — I5023 Acute on chronic systolic (congestive) heart failure: Secondary | ICD-10-CM | POA: Diagnosis not present

## 2018-02-03 DIAGNOSIS — Z95810 Presence of automatic (implantable) cardiac defibrillator: Secondary | ICD-10-CM | POA: Diagnosis not present

## 2018-02-25 DIAGNOSIS — E538 Deficiency of other specified B group vitamins: Secondary | ICD-10-CM | POA: Diagnosis not present

## 2018-02-25 DIAGNOSIS — I513 Intracardiac thrombosis, not elsewhere classified: Secondary | ICD-10-CM | POA: Diagnosis not present

## 2018-02-25 DIAGNOSIS — E46 Unspecified protein-calorie malnutrition: Secondary | ICD-10-CM | POA: Diagnosis not present

## 2018-02-25 DIAGNOSIS — J9611 Chronic respiratory failure with hypoxia: Secondary | ICD-10-CM | POA: Diagnosis not present

## 2018-03-05 DIAGNOSIS — Z4502 Encounter for adjustment and management of automatic implantable cardiac defibrillator: Secondary | ICD-10-CM | POA: Diagnosis not present

## 2018-04-01 DIAGNOSIS — E46 Unspecified protein-calorie malnutrition: Secondary | ICD-10-CM | POA: Diagnosis not present

## 2018-04-01 DIAGNOSIS — J208 Acute bronchitis due to other specified organisms: Secondary | ICD-10-CM | POA: Diagnosis not present

## 2018-04-01 DIAGNOSIS — B359 Dermatophytosis, unspecified: Secondary | ICD-10-CM | POA: Diagnosis not present

## 2018-04-01 DIAGNOSIS — E538 Deficiency of other specified B group vitamins: Secondary | ICD-10-CM | POA: Diagnosis not present

## 2018-04-16 DIAGNOSIS — I513 Intracardiac thrombosis, not elsewhere classified: Secondary | ICD-10-CM | POA: Diagnosis not present

## 2018-04-16 DIAGNOSIS — I10 Essential (primary) hypertension: Secondary | ICD-10-CM | POA: Diagnosis not present

## 2018-04-16 DIAGNOSIS — J9611 Chronic respiratory failure with hypoxia: Secondary | ICD-10-CM | POA: Diagnosis not present

## 2018-04-16 DIAGNOSIS — B359 Dermatophytosis, unspecified: Secondary | ICD-10-CM | POA: Diagnosis not present

## 2018-04-16 DIAGNOSIS — E46 Unspecified protein-calorie malnutrition: Secondary | ICD-10-CM | POA: Diagnosis not present

## 2018-04-21 DIAGNOSIS — I5023 Acute on chronic systolic (congestive) heart failure: Secondary | ICD-10-CM | POA: Diagnosis not present

## 2018-04-21 DIAGNOSIS — B372 Candidiasis of skin and nail: Secondary | ICD-10-CM | POA: Diagnosis not present

## 2018-05-03 DIAGNOSIS — J069 Acute upper respiratory infection, unspecified: Secondary | ICD-10-CM | POA: Diagnosis not present

## 2018-05-03 DIAGNOSIS — H6090 Unspecified otitis externa, unspecified ear: Secondary | ICD-10-CM | POA: Diagnosis not present

## 2018-05-03 DIAGNOSIS — E46 Unspecified protein-calorie malnutrition: Secondary | ICD-10-CM | POA: Diagnosis not present

## 2018-05-03 DIAGNOSIS — E538 Deficiency of other specified B group vitamins: Secondary | ICD-10-CM | POA: Diagnosis not present

## 2018-05-20 DIAGNOSIS — J9611 Chronic respiratory failure with hypoxia: Secondary | ICD-10-CM | POA: Diagnosis not present

## 2018-05-20 DIAGNOSIS — I513 Intracardiac thrombosis, not elsewhere classified: Secondary | ICD-10-CM | POA: Diagnosis not present

## 2018-05-20 DIAGNOSIS — I2510 Atherosclerotic heart disease of native coronary artery without angina pectoris: Secondary | ICD-10-CM | POA: Diagnosis not present

## 2018-05-20 DIAGNOSIS — E46 Unspecified protein-calorie malnutrition: Secondary | ICD-10-CM | POA: Diagnosis not present

## 2018-06-04 DIAGNOSIS — E46 Unspecified protein-calorie malnutrition: Secondary | ICD-10-CM | POA: Diagnosis not present

## 2018-06-04 DIAGNOSIS — J9611 Chronic respiratory failure with hypoxia: Secondary | ICD-10-CM | POA: Diagnosis not present

## 2018-06-04 DIAGNOSIS — E538 Deficiency of other specified B group vitamins: Secondary | ICD-10-CM | POA: Diagnosis not present

## 2018-06-04 DIAGNOSIS — Z4502 Encounter for adjustment and management of automatic implantable cardiac defibrillator: Secondary | ICD-10-CM | POA: Diagnosis not present

## 2018-06-04 DIAGNOSIS — J069 Acute upper respiratory infection, unspecified: Secondary | ICD-10-CM | POA: Diagnosis not present

## 2018-07-05 DIAGNOSIS — L0390 Cellulitis, unspecified: Secondary | ICD-10-CM | POA: Diagnosis not present

## 2018-07-05 DIAGNOSIS — I2510 Atherosclerotic heart disease of native coronary artery without angina pectoris: Secondary | ICD-10-CM | POA: Diagnosis not present

## 2018-07-05 DIAGNOSIS — E538 Deficiency of other specified B group vitamins: Secondary | ICD-10-CM | POA: Diagnosis not present

## 2018-07-05 DIAGNOSIS — I513 Intracardiac thrombosis, not elsewhere classified: Secondary | ICD-10-CM | POA: Diagnosis not present

## 2018-07-22 DIAGNOSIS — I255 Ischemic cardiomyopathy: Secondary | ICD-10-CM | POA: Diagnosis not present

## 2018-07-22 DIAGNOSIS — Z95810 Presence of automatic (implantable) cardiac defibrillator: Secondary | ICD-10-CM | POA: Diagnosis not present

## 2018-07-22 DIAGNOSIS — I5022 Chronic systolic (congestive) heart failure: Secondary | ICD-10-CM | POA: Diagnosis not present

## 2018-07-22 DIAGNOSIS — I469 Cardiac arrest, cause unspecified: Secondary | ICD-10-CM | POA: Diagnosis not present

## 2018-07-22 DIAGNOSIS — Z8639 Personal history of other endocrine, nutritional and metabolic disease: Secondary | ICD-10-CM | POA: Diagnosis not present

## 2018-08-04 DIAGNOSIS — I513 Intracardiac thrombosis, not elsewhere classified: Secondary | ICD-10-CM | POA: Diagnosis not present

## 2018-08-04 DIAGNOSIS — I2510 Atherosclerotic heart disease of native coronary artery without angina pectoris: Secondary | ICD-10-CM | POA: Diagnosis not present

## 2018-08-04 DIAGNOSIS — E538 Deficiency of other specified B group vitamins: Secondary | ICD-10-CM | POA: Diagnosis not present

## 2018-08-04 DIAGNOSIS — E539 Vitamin B deficiency, unspecified: Secondary | ICD-10-CM | POA: Diagnosis not present

## 2018-08-04 DIAGNOSIS — E46 Unspecified protein-calorie malnutrition: Secondary | ICD-10-CM | POA: Diagnosis not present

## 2018-08-13 DIAGNOSIS — I252 Old myocardial infarction: Secondary | ICD-10-CM | POA: Diagnosis not present

## 2018-08-13 DIAGNOSIS — I493 Ventricular premature depolarization: Secondary | ICD-10-CM | POA: Diagnosis not present

## 2018-08-31 DIAGNOSIS — I5022 Chronic systolic (congestive) heart failure: Secondary | ICD-10-CM | POA: Diagnosis not present

## 2018-09-06 DIAGNOSIS — I2510 Atherosclerotic heart disease of native coronary artery without angina pectoris: Secondary | ICD-10-CM | POA: Diagnosis not present

## 2018-09-06 DIAGNOSIS — I513 Intracardiac thrombosis, not elsewhere classified: Secondary | ICD-10-CM | POA: Diagnosis not present

## 2018-09-06 DIAGNOSIS — E785 Hyperlipidemia, unspecified: Secondary | ICD-10-CM | POA: Diagnosis not present

## 2018-09-06 DIAGNOSIS — H6693 Otitis media, unspecified, bilateral: Secondary | ICD-10-CM | POA: Diagnosis not present

## 2018-09-06 DIAGNOSIS — E538 Deficiency of other specified B group vitamins: Secondary | ICD-10-CM | POA: Diagnosis not present

## 2018-09-06 DIAGNOSIS — I10 Essential (primary) hypertension: Secondary | ICD-10-CM | POA: Diagnosis not present

## 2018-10-07 DIAGNOSIS — Z139 Encounter for screening, unspecified: Secondary | ICD-10-CM | POA: Diagnosis not present

## 2018-10-07 DIAGNOSIS — B9689 Other specified bacterial agents as the cause of diseases classified elsewhere: Secondary | ICD-10-CM | POA: Diagnosis not present

## 2018-10-07 DIAGNOSIS — E538 Deficiency of other specified B group vitamins: Secondary | ICD-10-CM | POA: Diagnosis not present

## 2018-10-07 DIAGNOSIS — I513 Intracardiac thrombosis, not elsewhere classified: Secondary | ICD-10-CM | POA: Diagnosis not present

## 2018-10-07 DIAGNOSIS — Z9181 History of falling: Secondary | ICD-10-CM | POA: Diagnosis not present

## 2018-10-07 DIAGNOSIS — Z1331 Encounter for screening for depression: Secondary | ICD-10-CM | POA: Diagnosis not present

## 2018-10-07 DIAGNOSIS — J208 Acute bronchitis due to other specified organisms: Secondary | ICD-10-CM | POA: Diagnosis not present

## 2018-10-07 DIAGNOSIS — I2510 Atherosclerotic heart disease of native coronary artery without angina pectoris: Secondary | ICD-10-CM | POA: Diagnosis not present

## 2018-10-15 DIAGNOSIS — Z0000 Encounter for general adult medical examination without abnormal findings: Secondary | ICD-10-CM | POA: Diagnosis not present

## 2018-10-15 DIAGNOSIS — E785 Hyperlipidemia, unspecified: Secondary | ICD-10-CM | POA: Diagnosis not present

## 2018-10-15 DIAGNOSIS — Z9181 History of falling: Secondary | ICD-10-CM | POA: Diagnosis not present

## 2018-10-15 DIAGNOSIS — Z1331 Encounter for screening for depression: Secondary | ICD-10-CM | POA: Diagnosis not present

## 2018-11-08 DIAGNOSIS — I513 Intracardiac thrombosis, not elsewhere classified: Secondary | ICD-10-CM | POA: Diagnosis not present

## 2018-11-08 DIAGNOSIS — E538 Deficiency of other specified B group vitamins: Secondary | ICD-10-CM | POA: Diagnosis not present

## 2018-11-08 DIAGNOSIS — I2510 Atherosclerotic heart disease of native coronary artery without angina pectoris: Secondary | ICD-10-CM | POA: Diagnosis not present

## 2018-11-08 DIAGNOSIS — J0100 Acute maxillary sinusitis, unspecified: Secondary | ICD-10-CM | POA: Diagnosis not present

## 2018-11-23 DIAGNOSIS — Z95810 Presence of automatic (implantable) cardiac defibrillator: Secondary | ICD-10-CM | POA: Diagnosis not present

## 2018-11-23 DIAGNOSIS — Z951 Presence of aortocoronary bypass graft: Secondary | ICD-10-CM | POA: Diagnosis not present

## 2018-11-23 DIAGNOSIS — I255 Ischemic cardiomyopathy: Secondary | ICD-10-CM | POA: Diagnosis not present

## 2018-11-23 DIAGNOSIS — I2510 Atherosclerotic heart disease of native coronary artery without angina pectoris: Secondary | ICD-10-CM | POA: Diagnosis not present

## 2018-11-23 DIAGNOSIS — I5022 Chronic systolic (congestive) heart failure: Secondary | ICD-10-CM | POA: Diagnosis not present

## 2018-11-23 DIAGNOSIS — Z7982 Long term (current) use of aspirin: Secondary | ICD-10-CM | POA: Diagnosis not present

## 2018-12-03 DIAGNOSIS — Z4502 Encounter for adjustment and management of automatic implantable cardiac defibrillator: Secondary | ICD-10-CM | POA: Diagnosis not present

## 2018-12-09 DIAGNOSIS — E538 Deficiency of other specified B group vitamins: Secondary | ICD-10-CM | POA: Diagnosis not present

## 2018-12-09 DIAGNOSIS — I513 Intracardiac thrombosis, not elsewhere classified: Secondary | ICD-10-CM | POA: Diagnosis not present

## 2018-12-09 DIAGNOSIS — E46 Unspecified protein-calorie malnutrition: Secondary | ICD-10-CM | POA: Diagnosis not present

## 2018-12-09 DIAGNOSIS — I2510 Atherosclerotic heart disease of native coronary artery without angina pectoris: Secondary | ICD-10-CM | POA: Diagnosis not present

## 2019-01-17 DIAGNOSIS — I513 Intracardiac thrombosis, not elsewhere classified: Secondary | ICD-10-CM | POA: Diagnosis not present

## 2019-01-17 DIAGNOSIS — J9611 Chronic respiratory failure with hypoxia: Secondary | ICD-10-CM | POA: Diagnosis not present

## 2019-01-17 DIAGNOSIS — E538 Deficiency of other specified B group vitamins: Secondary | ICD-10-CM | POA: Diagnosis not present

## 2019-01-17 DIAGNOSIS — I2510 Atherosclerotic heart disease of native coronary artery without angina pectoris: Secondary | ICD-10-CM | POA: Diagnosis not present

## 2019-01-17 DIAGNOSIS — B359 Dermatophytosis, unspecified: Secondary | ICD-10-CM | POA: Diagnosis not present

## 2019-02-16 DIAGNOSIS — I513 Intracardiac thrombosis, not elsewhere classified: Secondary | ICD-10-CM | POA: Diagnosis not present

## 2019-02-16 DIAGNOSIS — E785 Hyperlipidemia, unspecified: Secondary | ICD-10-CM | POA: Diagnosis not present

## 2019-02-16 DIAGNOSIS — E538 Deficiency of other specified B group vitamins: Secondary | ICD-10-CM | POA: Diagnosis not present

## 2019-02-16 DIAGNOSIS — I10 Essential (primary) hypertension: Secondary | ICD-10-CM | POA: Diagnosis not present

## 2019-02-16 DIAGNOSIS — I2510 Atherosclerotic heart disease of native coronary artery without angina pectoris: Secondary | ICD-10-CM | POA: Diagnosis not present

## 2019-02-16 DIAGNOSIS — J0100 Acute maxillary sinusitis, unspecified: Secondary | ICD-10-CM | POA: Diagnosis not present

## 2019-03-10 IMAGING — DX DG CHEST 1V PORT
1 series · 1 of 1 positions shown · non-contrast
Comparison: Chest x-ray dated July 24, 2017.

CLINICAL DATA: Shortness of breath and chest pain.

EXAM:
PORTABLE CHEST 1 VIEW

[chest ap]
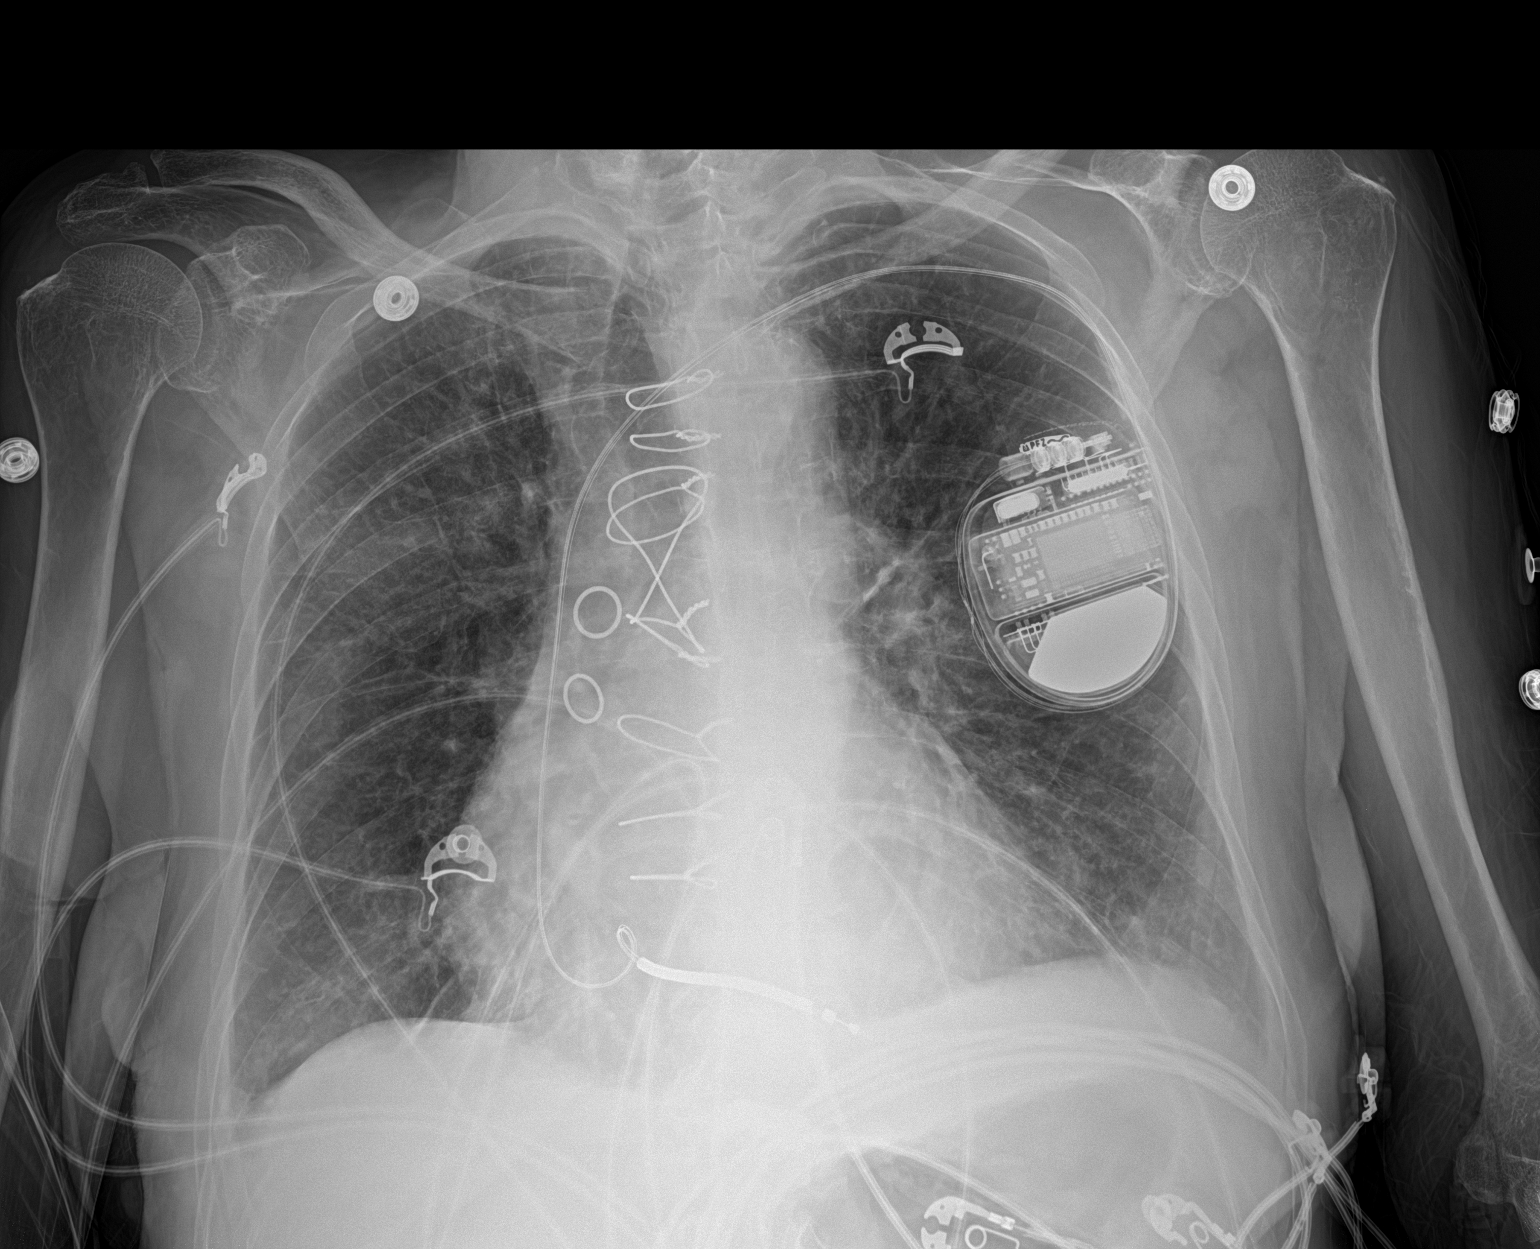

[1 of 1 positions shown; findings below may reference images not displayed]

FINDINGS: Unchanged left chest wall pacer device. Stable cardiomediastinal
silhouette status post CABG. Diffuse, mildly increased interstitial
markings throughout both lungs. No focal consolidation, pleural
effusion, or pneumothorax. No acute osseous abnormality.
IMPRESSION: 1. New mild interstitial pulmonary edema.

## 2019-03-18 DIAGNOSIS — Z4502 Encounter for adjustment and management of automatic implantable cardiac defibrillator: Secondary | ICD-10-CM | POA: Diagnosis not present

## 2019-03-22 DIAGNOSIS — B9689 Other specified bacterial agents as the cause of diseases classified elsewhere: Secondary | ICD-10-CM | POA: Diagnosis not present

## 2019-03-22 DIAGNOSIS — I2510 Atherosclerotic heart disease of native coronary artery without angina pectoris: Secondary | ICD-10-CM | POA: Diagnosis not present

## 2019-03-22 DIAGNOSIS — J208 Acute bronchitis due to other specified organisms: Secondary | ICD-10-CM | POA: Diagnosis not present

## 2019-03-22 DIAGNOSIS — I513 Intracardiac thrombosis, not elsewhere classified: Secondary | ICD-10-CM | POA: Diagnosis not present

## 2019-04-21 DIAGNOSIS — J9611 Chronic respiratory failure with hypoxia: Secondary | ICD-10-CM | POA: Diagnosis not present

## 2019-04-21 DIAGNOSIS — E46 Unspecified protein-calorie malnutrition: Secondary | ICD-10-CM | POA: Diagnosis not present

## 2019-04-21 DIAGNOSIS — E785 Hyperlipidemia, unspecified: Secondary | ICD-10-CM | POA: Diagnosis not present

## 2019-04-21 DIAGNOSIS — I513 Intracardiac thrombosis, not elsewhere classified: Secondary | ICD-10-CM | POA: Diagnosis not present

## 2019-05-19 DIAGNOSIS — E538 Deficiency of other specified B group vitamins: Secondary | ICD-10-CM | POA: Diagnosis not present

## 2019-05-19 DIAGNOSIS — J9611 Chronic respiratory failure with hypoxia: Secondary | ICD-10-CM | POA: Diagnosis not present

## 2019-05-19 DIAGNOSIS — E46 Unspecified protein-calorie malnutrition: Secondary | ICD-10-CM | POA: Diagnosis not present

## 2019-05-19 DIAGNOSIS — J208 Acute bronchitis due to other specified organisms: Secondary | ICD-10-CM | POA: Diagnosis not present

## 2019-06-17 DIAGNOSIS — Z4502 Encounter for adjustment and management of automatic implantable cardiac defibrillator: Secondary | ICD-10-CM | POA: Diagnosis not present

## 2019-06-19 DIAGNOSIS — R0602 Shortness of breath: Secondary | ICD-10-CM | POA: Diagnosis not present

## 2019-06-19 DIAGNOSIS — Z951 Presence of aortocoronary bypass graft: Secondary | ICD-10-CM | POA: Diagnosis not present

## 2019-06-19 DIAGNOSIS — R062 Wheezing: Secondary | ICD-10-CM | POA: Diagnosis not present

## 2019-06-19 DIAGNOSIS — Z9981 Dependence on supplemental oxygen: Secondary | ICD-10-CM | POA: Diagnosis not present

## 2019-06-19 DIAGNOSIS — R069 Unspecified abnormalities of breathing: Secondary | ICD-10-CM | POA: Diagnosis not present

## 2019-06-19 DIAGNOSIS — R9431 Abnormal electrocardiogram [ECG] [EKG]: Secondary | ICD-10-CM | POA: Diagnosis not present

## 2019-06-19 DIAGNOSIS — I509 Heart failure, unspecified: Secondary | ICD-10-CM | POA: Diagnosis not present

## 2019-06-19 DIAGNOSIS — B86 Scabies: Secondary | ICD-10-CM | POA: Diagnosis not present

## 2019-06-19 DIAGNOSIS — J441 Chronic obstructive pulmonary disease with (acute) exacerbation: Secondary | ICD-10-CM | POA: Diagnosis not present

## 2019-06-19 DIAGNOSIS — I2510 Atherosclerotic heart disease of native coronary artery without angina pectoris: Secondary | ICD-10-CM | POA: Diagnosis not present

## 2019-06-19 DIAGNOSIS — R0902 Hypoxemia: Secondary | ICD-10-CM | POA: Diagnosis not present

## 2019-06-19 DIAGNOSIS — Z87891 Personal history of nicotine dependence: Secondary | ICD-10-CM | POA: Diagnosis not present

## 2019-06-19 DIAGNOSIS — I110 Hypertensive heart disease with heart failure: Secondary | ICD-10-CM | POA: Diagnosis not present

## 2019-06-19 DIAGNOSIS — I10 Essential (primary) hypertension: Secondary | ICD-10-CM | POA: Diagnosis not present

## 2019-06-20 DIAGNOSIS — R7303 Prediabetes: Secondary | ICD-10-CM | POA: Diagnosis not present

## 2019-06-20 DIAGNOSIS — E538 Deficiency of other specified B group vitamins: Secondary | ICD-10-CM | POA: Diagnosis not present

## 2019-06-20 DIAGNOSIS — E785 Hyperlipidemia, unspecified: Secondary | ICD-10-CM | POA: Diagnosis not present

## 2019-06-20 DIAGNOSIS — Z95810 Presence of automatic (implantable) cardiac defibrillator: Secondary | ICD-10-CM | POA: Diagnosis not present

## 2019-06-20 DIAGNOSIS — I10 Essential (primary) hypertension: Secondary | ICD-10-CM | POA: Diagnosis not present

## 2019-06-27 ENCOUNTER — Other Ambulatory Visit: Payer: Self-pay

## 2019-06-27 ENCOUNTER — Inpatient Hospital Stay (HOSPITAL_COMMUNITY)
Admission: EM | Admit: 2019-06-27 | Discharge: 2019-07-01 | DRG: 291 | Disposition: A | Discharge status: Home Health | Payer: Medicare Other | Attending: Internal Medicine | Admitting: Internal Medicine

## 2019-06-27 ENCOUNTER — Emergency Department (HOSPITAL_COMMUNITY): Payer: Medicare Other

## 2019-06-27 ENCOUNTER — Encounter (HOSPITAL_COMMUNITY): Payer: Self-pay | Admitting: Emergency Medicine

## 2019-06-27 DIAGNOSIS — I509 Heart failure, unspecified: Secondary | ICD-10-CM | POA: Diagnosis not present

## 2019-06-27 DIAGNOSIS — Z885 Allergy status to narcotic agent status: Secondary | ICD-10-CM

## 2019-06-27 DIAGNOSIS — Z8249 Family history of ischemic heart disease and other diseases of the circulatory system: Secondary | ICD-10-CM

## 2019-06-27 DIAGNOSIS — Z87891 Personal history of nicotine dependence: Secondary | ICD-10-CM

## 2019-06-27 DIAGNOSIS — I4729 Other ventricular tachycardia: Secondary | ICD-10-CM

## 2019-06-27 DIAGNOSIS — Z8049 Family history of malignant neoplasm of other genital organs: Secondary | ICD-10-CM

## 2019-06-27 DIAGNOSIS — J9611 Chronic respiratory failure with hypoxia: Secondary | ICD-10-CM | POA: Diagnosis not present

## 2019-06-27 DIAGNOSIS — I130 Hypertensive heart and chronic kidney disease with heart failure and stage 1 through stage 4 chronic kidney disease, or unspecified chronic kidney disease: Secondary | ICD-10-CM | POA: Diagnosis not present

## 2019-06-27 DIAGNOSIS — I472 Ventricular tachycardia: Secondary | ICD-10-CM | POA: Diagnosis present

## 2019-06-27 DIAGNOSIS — N179 Acute kidney failure, unspecified: Secondary | ICD-10-CM

## 2019-06-27 DIAGNOSIS — D631 Anemia in chronic kidney disease: Secondary | ICD-10-CM | POA: Diagnosis present

## 2019-06-27 DIAGNOSIS — Z95810 Presence of automatic (implantable) cardiac defibrillator: Secondary | ICD-10-CM

## 2019-06-27 DIAGNOSIS — I5043 Acute on chronic combined systolic (congestive) and diastolic (congestive) heart failure: Secondary | ICD-10-CM | POA: Diagnosis present

## 2019-06-27 DIAGNOSIS — J90 Pleural effusion, not elsewhere classified: Secondary | ICD-10-CM | POA: Diagnosis not present

## 2019-06-27 DIAGNOSIS — Z79899 Other long term (current) drug therapy: Secondary | ICD-10-CM

## 2019-06-27 DIAGNOSIS — J441 Chronic obstructive pulmonary disease with (acute) exacerbation: Secondary | ICD-10-CM | POA: Diagnosis present

## 2019-06-27 DIAGNOSIS — Z20822 Contact with and (suspected) exposure to covid-19: Secondary | ICD-10-CM | POA: Diagnosis not present

## 2019-06-27 DIAGNOSIS — I447 Left bundle-branch block, unspecified: Secondary | ICD-10-CM | POA: Diagnosis present

## 2019-06-27 DIAGNOSIS — Z888 Allergy status to other drugs, medicaments and biological substances status: Secondary | ICD-10-CM

## 2019-06-27 DIAGNOSIS — I2510 Atherosclerotic heart disease of native coronary artery without angina pectoris: Secondary | ICD-10-CM | POA: Diagnosis present

## 2019-06-27 DIAGNOSIS — Z951 Presence of aortocoronary bypass graft: Secondary | ICD-10-CM

## 2019-06-27 DIAGNOSIS — N1832 Chronic kidney disease, stage 3b: Secondary | ICD-10-CM | POA: Diagnosis present

## 2019-06-27 DIAGNOSIS — R0602 Shortness of breath: Secondary | ICD-10-CM | POA: Diagnosis not present

## 2019-06-27 DIAGNOSIS — Z7982 Long term (current) use of aspirin: Secondary | ICD-10-CM

## 2019-06-27 DIAGNOSIS — Z86711 Personal history of pulmonary embolism: Secondary | ICD-10-CM

## 2019-06-27 DIAGNOSIS — Z880 Allergy status to penicillin: Secondary | ICD-10-CM

## 2019-06-27 DIAGNOSIS — F419 Anxiety disorder, unspecified: Secondary | ICD-10-CM | POA: Diagnosis present

## 2019-06-27 DIAGNOSIS — R079 Chest pain, unspecified: Secondary | ICD-10-CM | POA: Diagnosis not present

## 2019-06-27 DIAGNOSIS — I255 Ischemic cardiomyopathy: Secondary | ICD-10-CM | POA: Diagnosis present

## 2019-06-27 DIAGNOSIS — Z7951 Long term (current) use of inhaled steroids: Secondary | ICD-10-CM

## 2019-06-27 DIAGNOSIS — E785 Hyperlipidemia, unspecified: Secondary | ICD-10-CM | POA: Diagnosis present

## 2019-06-27 DIAGNOSIS — J449 Chronic obstructive pulmonary disease, unspecified: Secondary | ICD-10-CM

## 2019-06-27 DIAGNOSIS — I491 Atrial premature depolarization: Secondary | ICD-10-CM | POA: Diagnosis not present

## 2019-06-27 DIAGNOSIS — I5023 Acute on chronic systolic (congestive) heart failure: Secondary | ICD-10-CM | POA: Diagnosis not present

## 2019-06-27 DIAGNOSIS — R0789 Other chest pain: Secondary | ICD-10-CM | POA: Diagnosis not present

## 2019-06-27 LAB — CBC WITH DIFFERENTIAL/PLATELET
Abs Immature Granulocytes: 0.05 10*3/uL (ref 0.00–0.07)
Basophils Absolute: 0 10*3/uL (ref 0.0–0.1)
Basophils Relative: 0 %
Eosinophils Absolute: 0.2 10*3/uL (ref 0.0–0.5)
Eosinophils Relative: 2 %
HCT: 35 % — ABNORMAL LOW (ref 36.0–46.0)
Hemoglobin: 10.7 g/dL — ABNORMAL LOW (ref 12.0–15.0)
Immature Granulocytes: 1 %
Lymphocytes Relative: 8 %
Lymphs Abs: 0.8 10*3/uL (ref 0.7–4.0)
MCH: 29.5 pg (ref 26.0–34.0)
MCHC: 30.6 g/dL (ref 30.0–36.0)
MCV: 96.4 fL (ref 80.0–100.0)
Monocytes Absolute: 0.7 10*3/uL (ref 0.1–1.0)
Monocytes Relative: 7 %
Neutro Abs: 7.7 10*3/uL (ref 1.7–7.7)
Neutrophils Relative %: 82 %
Platelets: 291 10*3/uL (ref 150–400)
RBC: 3.63 MIL/uL — ABNORMAL LOW (ref 3.87–5.11)
RDW: 14.8 % (ref 11.5–15.5)
WBC: 9.4 10*3/uL (ref 4.0–10.5)
nRBC: 0 % (ref 0.0–0.2)

## 2019-06-27 LAB — BASIC METABOLIC PANEL
Anion gap: 11 (ref 5–15)
BUN: 34 mg/dL — ABNORMAL HIGH (ref 8–23)
CO2: 27 mmol/L (ref 22–32)
Calcium: 8.9 mg/dL (ref 8.9–10.3)
Chloride: 99 mmol/L (ref 98–111)
Creatinine, Ser: 1.64 mg/dL — ABNORMAL HIGH (ref 0.44–1.00)
GFR calc Af Amer: 33 mL/min — ABNORMAL LOW (ref >60)
GFR calc non Af Amer: 29 mL/min — ABNORMAL LOW (ref >60)
Glucose, Bld: 193 mg/dL — ABNORMAL HIGH (ref 70–99)
Potassium: 3.7 mmol/L (ref 3.5–5.1)
Sodium: 137 mmol/L (ref 135–145)

## 2019-06-27 LAB — URINALYSIS, ROUTINE W REFLEX MICROSCOPIC
Bilirubin Urine: NEGATIVE
Glucose, UA: NEGATIVE mg/dL
Hgb urine dipstick: NEGATIVE
Ketones, ur: NEGATIVE mg/dL
Leukocytes,Ua: NEGATIVE
Nitrite: NEGATIVE
Protein, ur: NEGATIVE mg/dL
Specific Gravity, Urine: 1.006 (ref 1.005–1.030)
pH: 5 (ref 5.0–8.0)

## 2019-06-27 LAB — BRAIN NATRIURETIC PEPTIDE: B Natriuretic Peptide: 446.8 pg/mL — ABNORMAL HIGH (ref 0.0–100.0)

## 2019-06-27 LAB — D-DIMER, QUANTITATIVE: D-Dimer, Quant: 1.55 ug/mL-FEU — ABNORMAL HIGH (ref 0.00–0.50)

## 2019-06-27 LAB — TROPONIN I (HIGH SENSITIVITY)
Troponin I (High Sensitivity): 14 ng/L (ref <18)
Troponin I (High Sensitivity): 17 ng/L (ref <18)

## 2019-06-27 MED ORDER — FLUTICASONE FUROATE-VILANTEROL 100-25 MCG/INH IN AEPB
1.0000 | INHALATION_SPRAY | Freq: Every day | RESPIRATORY_TRACT | Status: DC
  Administered 2019-06-28: 1 via RESPIRATORY_TRACT
  Filled 2019-06-27: qty 28

## 2019-06-27 MED ORDER — SODIUM CHLORIDE 0.9% FLUSH: 3.0000 mL | INTRAVENOUS | Status: DC | PRN

## 2019-06-27 MED ORDER — FUROSEMIDE 10 MG/ML IJ SOLN
40.0000 mg | Freq: Once | INTRAMUSCULAR | Status: AC
  Administered 2019-06-27: 40 mg via INTRAVENOUS
  Filled 2019-06-27: qty 4

## 2019-06-27 MED ORDER — HEPARIN SODIUM (PORCINE) 5000 UNIT/ML IJ SOLN: 5000.0000 [IU] | Freq: Three times a day (TID) | INTRAMUSCULAR | Status: DC

## 2019-06-27 MED ORDER — ALPRAZOLAM 0.5 MG PO TABS
0.5000 mg | ORAL_TABLET | Freq: Two times a day (BID) | ORAL | Status: DC | PRN
  Administered 2019-06-28 – 2019-07-01 (×8): 0.5 mg via ORAL
  Filled 2019-06-27 (×6): qty 1
  Filled 2019-06-27: qty 2
  Filled 2019-06-27: qty 1

## 2019-06-27 MED ORDER — FLUTICASONE PROPIONATE 50 MCG/ACT NA SUSP
2.0000 | Freq: Every day | NASAL | Status: DC
  Administered 2019-06-28 – 2019-07-01 (×4): 2 via NASAL
  Filled 2019-06-27: qty 16

## 2019-06-27 MED ORDER — ASPIRIN EC 81 MG PO TBEC
81.0000 mg | DELAYED_RELEASE_TABLET | Freq: Every day | ORAL | Status: DC
  Administered 2019-06-28 – 2019-07-01 (×4): 81 mg via ORAL
  Filled 2019-06-27 (×4): qty 1

## 2019-06-27 MED ORDER — SODIUM CHLORIDE 0.9% FLUSH
3.0000 mL | Freq: Two times a day (BID) | INTRAVENOUS | Status: DC
  Administered 2019-06-28 – 2019-07-01 (×7): 3 mL via INTRAVENOUS

## 2019-06-27 MED ORDER — ALBUTEROL SULFATE (2.5 MG/3ML) 0.083% IN NEBU
2.5000 mg | INHALATION_SOLUTION | Freq: Four times a day (QID) | RESPIRATORY_TRACT | Status: DC | PRN
  Administered 2019-06-28: 2.5 mg via RESPIRATORY_TRACT
  Filled 2019-06-27: qty 3

## 2019-06-27 MED ORDER — RISAQUAD PO CAPS
1.0000 | ORAL_CAPSULE | Freq: Every day | ORAL | Status: DC
  Administered 2019-06-28 – 2019-07-01 (×4): 1 via ORAL
  Filled 2019-06-27 (×4): qty 1

## 2019-06-27 MED ORDER — SODIUM CHLORIDE 0.9 % IV SOLN: 250.0000 mL | INTRAVENOUS | Status: DC | PRN

## 2019-06-27 MED ORDER — CARVEDILOL 3.125 MG PO TABS
3.1250 mg | ORAL_TABLET | Freq: Two times a day (BID) | ORAL | Status: DC
  Administered 2019-06-28 – 2019-07-01 (×8): 3.125 mg via ORAL
  Filled 2019-06-27 (×10): qty 1

## 2019-06-27 MED ORDER — FERROUS SULFATE 325 (65 FE) MG PO TABS
324.0000 mg | ORAL_TABLET | Freq: Every day | ORAL | Status: DC
  Administered 2019-06-28 – 2019-07-01 (×4): 324 mg via ORAL
  Filled 2019-06-27 (×4): qty 1

## 2019-06-27 MED ORDER — PANTOPRAZOLE SODIUM 40 MG PO TBEC
40.0000 mg | DELAYED_RELEASE_TABLET | Freq: Every day | ORAL | Status: DC
  Administered 2019-06-28 – 2019-07-01 (×4): 40 mg via ORAL
  Filled 2019-06-27 (×4): qty 1

## 2019-06-27 MED ORDER — VITAMIN D (ERGOCALCIFEROL) 1.25 MG (50000 UNIT) PO CAPS
50000.0000 [IU] | ORAL_CAPSULE | ORAL | Status: DC
  Administered 2019-06-28: 50000 [IU] via ORAL
  Filled 2019-06-27: qty 1

## 2019-06-27 MED ORDER — UMECLIDINIUM BROMIDE 62.5 MCG/INH IN AEPB
1.0000 | INHALATION_SPRAY | Freq: Every day | RESPIRATORY_TRACT | Status: DC
  Administered 2019-06-28 – 2019-07-01 (×4): 1 via RESPIRATORY_TRACT
  Filled 2019-06-27: qty 7

## 2019-06-27 NOTE — ED Provider Notes (Signed)
[facility] EMERGENCY DEPARTMENT Provider Note   CSN: [id number] Arrival date & time:        History Chief Complaint  Patient presents with  . Shortness of Breath    Brandi Gardner is a 85 y.o. female.  HPI   85 year old female with a past medical history of HFrEF, status post AICD placement, CAD status post CABG, previous PEs, COPD on 2 L nasal cannula baseline, HLD, HTN presenting to the emergency department complaining of worsening shortness of breath began this morning at 5 AM.  Patient noted that her ICD "made a sound and went off".  The device did not shock her.  Patient notes that she is always short of breath but today was much worse than usual.  Patient reports that she is been progressively worsening in terms of her shortness of breath and ability to move around her home over the past several weeks to month.  Patient feels that she has been gaining weight with increased swelling in her bilateral lower extremities.  Patient notes that she has a chronic cough that is unchanged without increased sputum or change in consistency or color.  Patient notes that she constantly has chest pressure that is unchanged.  Patient notes that she is also have decreased urination and has been told that her kidneys are worsening.  She notes that her urination has been decreasing gradually over the past 2 weeks.  Patient states she takes 80 mg of furosemide in the mornings and 25 mg of spironolactone daily.  Past Medical History:  Diagnosis Date  . Acute on chronic systolic (congestive) heart failure ([place]) 04/24/2015  . AICD (automatic cardioverter/defibrillator) present   . Anxiety   . Cardiac defibrillator in place 04/25/2015  . CHF (congestive heart failure) ([place])   . Chronic obstructive pulmonary disease with acute exacerbation ([place]) 04/22/2015  . COPD (chronic obstructive pulmonary disease) ([place])   . Coronary artery disease   . Hx of pulmonary embolus 1980  . Hyperlipidemia     . Hypertension   . Ischemic cardiomyopathy 05/24/2015  . S/P CABG (coronary artery bypass graft) 05/24/2015    Patient Active Problem List   Diagnosis Date Noted  . Acute exacerbation of CHF (congestive heart failure) ([place]) 06/27/2019  . Chronic respiratory failure with hypoxia ([place]) 06/27/2019  . NSVT (nonsustained ventricular tachycardia) ([place]) 06/27/2019  . COPD (chronic obstructive pulmonary disease) ([place]) 06/27/2019  . AKI (acute kidney injury) ([place]) 06/27/2019  . Acute respiratory failure with hypoxia and hypercapnia ([facility])   . Congestive heart failure (CHF) ([place]) 08/26/2017  . CHF (congestive heart failure) ([place]) 08/26/2017  . Hyperkalemia 10/22/2016  . Mitral regurgitation 10/13/2016  . Hyperlipidemia   . Ischemic cardiomyopathy 05/24/2015  . S/P CABG (coronary artery bypass graft) 05/24/2015  . Cardiac defibrillator in place 04/25/2015  . Acute on chronic systolic (congestive) heart failure ([place]) 04/24/2015  . Chronic obstructive pulmonary disease with acute exacerbation ([place]) 04/22/2015  . Hx of pulmonary embolus 03/10/1978    Past Surgical History:  Procedure Laterality Date  . CARDIAC CATHETERIZATION    . CORONARY ARTERY BYPASS GRAFT    . IR GENERIC HISTORICAL  11/08/2015   IR RADIOLOGIST EVAL & MGMT 11/08/2015 [facility]  . IR GENERIC HISTORICAL  11/13/2015   IR VERTEBROPLASTY LUMBAR BX INC UNI/BIL INC/INJECT/IMAGING 11/13/2015 Luanne Bras, MD [facility]  . IR GENERIC HISTORICAL  12/07/2015   IR RADIOLOGIST EVAL & MGMT 12/07/2015 [facility]  . TUBAL LIGATION  OB History   No obstetric history on file.     Family History  Problem Relation Age of Onset  . Hypertension Mother   . Uterine cancer Mother   . Hypertension Father     Social History   Tobacco Use  . Smoking status: Former Smoker    Quit date: 11/12/1992    Years since quitting: 26.6  . Smokeless tobacco: Never Used  Substance Use Topics  . Alcohol use: No  . Drug use: No     Home Medications Prior to Admission medications   Medication Sig Start Date End Date Taking? Authorizing Provider  acetaminophen (TYLENOL) 500 MG tablet Take 500 mg by mouth every 6 (six) hours as needed for mild pain or headache.    Yes [provider]  albuterol (PROAIR HFA) 108 (90 Base) MCG/ACT inhaler Inhale 2 puffs into the lungs every 6 (six) hours as needed for wheezing or shortness of breath.   Yes [provider]  albuterol (PROVENTIL) (2.5 MG/3ML) 0.083% nebulizer solution Take 2.5 mg by nebulization in the morning and at bedtime.   Yes [provider]  ALPRAZolam Duanne Moron) 1 MG tablet Take 1 tablet (1 mg total) by mouth 2 (two) times daily as needed for anxiety. Patient taking differently: Take 1 mg by mouth daily.  08/30/17  Yes Guadalupe Dawn, MD  aspirin EC 81 MG tablet Take 81 mg by mouth daily.   Yes [provider]  budesonide (PULMICORT) 0.5 MG/2ML nebulizer solution Take 0.5 mg by nebulization in the morning and at bedtime.   Yes [provider]  carvedilol (COREG) 3.125 MG tablet Take 3.125 mg by mouth 2 (two) times daily with a meal.   Yes [provider]  D3-50 50000 units capsule Take 50,000 Units by mouth every Tuesday.  08/21/17  Yes [provider]  diphenhydrAMINE-PE-APAP (EQ ALLERGY & SINUS HEADACHE PO) Take 1 tablet by mouth every 6 (six) hours as needed (for sinus symptoms/congestion).    Yes [provider]  ferrous sulfate 324 (65 Fe) MG TBEC Take 324 mg by mouth daily with breakfast.  06/09/17  Yes [provider]  fluticasone (FLONASE) 50 MCG/ACT nasal spray Place 2 sprays into both nostrils daily.   Yes [provider]  fluticasone furoate-vilanterol (BREO ELLIPTA) 100-25 MCG/INH AEPB Inhale 1 puff into the lungs daily.   Yes [provider]  furosemide (LASIX) 40 MG tablet Take 1 tablet (40 mg total) by mouth 2 (two) times daily. Patient taking differently: Take 80  mg by mouth in the morning.  08/30/17  Yes Guadalupe Dawn, MD  nitroGLYCERIN (NITROSTAT) 0.4 MG SL tablet Place 0.4 mg under the tongue every 5 (five) minutes as needed for chest pain.   Yes [provider]  nystatin-triamcinolone (MYCOLOG II) cream Apply 1 application topically 2 (two) times daily as needed (for yeast).   Yes [provider]  OXYGEN Inhale 2 L/min into the lungs continuous.   Yes [provider]  pantoprazole (PROTONIX) 40 MG tablet Take 40 mg by mouth daily before breakfast.  06/09/17  Yes [provider]  Probiotic CAPS Take 1 capsule by mouth daily.   Yes [provider]  spironolactone (ALDACTONE) 25 MG tablet Take 25 mg by mouth daily.   Yes [provider]  tiotropium (SPIRIVA) 18 MCG inhalation capsule Place 18 mcg into inhaler and inhale daily.   Yes [provider]  tiZANidine (ZANAFLEX) 4 MG tablet Take 4 mg by mouth every 8 (  eight) hours as needed (for back pain).  08/21/17  Yes [provider]  fluticasone furoate-vilanterol (BREO ELLIPTA) 200-25 MCG/INH AEPB Inhale 1 puff into the lungs daily. Patient not taking: Reported on 06/27/2019 07/21/17   Marshell Garfinkel, MD    Allergies    Diphenhydramine, Doxycycline, Hydrocodone, Oxycodone, Penicillins, Potassium-containing compounds, Tramadol, Hydrocodone-acetaminophen, Other, Percocet [oxycodone-acetaminophen], Vicodin [hydrocodone-acetaminophen], Potassium, and Statins  Review of Systems   Review of Systems  Constitutional: Positive for activity change and fatigue. Negative for appetite change, chills, diaphoresis and fever.  HENT: Negative for congestion and rhinorrhea.   Respiratory: Positive for cough and shortness of breath. Negative for wheezing.   Cardiovascular: Positive for chest pain and leg swelling. Negative for palpitations.  Gastrointestinal: Negative for abdominal distention, abdominal pain, diarrhea, nausea and vomiting.   Genitourinary: Positive for decreased urine volume. Negative for difficulty urinating, dysuria, frequency and urgency.  Musculoskeletal: Negative for gait problem.  Skin: Negative for color change and wound.  Neurological: Negative for dizziness, syncope, weakness, light-headedness and headaches.  All other systems reviewed and are negative.   Physical Exam Updated Vital Signs BP 123/60   Pulse 80   Temp 97.9 F (36.6 C) (Oral)   Resp 18   Ht 5\' 4"  (1.626 m)   Wt 67.1 kg   SpO2 100%   BMI 25.40 kg/m   Physical Exam Vitals and nursing note reviewed.  Constitutional:      General: She is not in acute distress.    Appearance: Normal appearance. She is normal weight. She is ill-appearing.  HENT:     Head: Normocephalic.     Right Ear: External ear normal.     Left Ear: External ear normal.     Nose: Nose normal.     Mouth/Throat:     Mouth: Mucous membranes are moist.     Pharynx: Oropharynx is clear.  Eyes:     Extraocular Movements: Extraocular movements intact.  Neck:     Vascular: Hepatojugular reflux and JVD present.  Cardiovascular:     Rate and Rhythm: Normal rate and regular rhythm.     Pulses: Normal pulses.     Heart sounds: Normal heart sounds.  Pulmonary:     Effort: Pulmonary effort is normal. Tachypnea present. No accessory muscle usage or respiratory distress.     Breath sounds: Examination of the right-upper field reveals wheezing. Examination of the left-upper field reveals wheezing. Examination of the right-lower field reveals decreased breath sounds. Examination of the left-lower field reveals decreased breath sounds. Decreased breath sounds and wheezing present. No rhonchi.  Abdominal:     General: Bowel sounds are normal.     Palpations: Abdomen is soft.     Tenderness: There is no abdominal tenderness. There is no guarding.  Musculoskeletal:     Cervical back: Normal range of motion.     Right lower leg: Edema present.     Left lower leg: Edema  present.     Comments: 1+ pitting edema to the bilateral ankles  Skin:    General: Skin is warm and dry.     Capillary Refill: Capillary refill takes less than 2 seconds.  Neurological:     General: No focal deficit present.     Mental Status: She is alert and oriented to person, place, and time. Mental status is at baseline.  Psychiatric:        Mood and Affect: Mood normal.     ED Results / Procedures / Treatments   Labs (all labs  ordered are listed, but only abnormal results are displayed) Labs Reviewed  BASIC METABOLIC PANEL - Abnormal; Notable for the following components:      Result Value   Glucose, Bld 193 (*)    BUN 34 (*)    Creatinine, Ser 1.64 (*)    GFR calc non Af Amer 29 (*)    GFR calc Af Amer 33 (*)    All other components within normal limits  CBC WITH DIFFERENTIAL/PLATELET - Abnormal; Notable for the following components:   RBC 3.63 (*)    Hemoglobin 10.7 (*)    HCT 35.0 (*)    All other components within normal limits  BRAIN NATRIURETIC PEPTIDE - Abnormal; Notable for the following components:   B Natriuretic Peptide 446.8 (*)    All other components within normal limits  URINALYSIS, ROUTINE W REFLEX MICROSCOPIC - Abnormal; Notable for the following components:   Color, Urine STRAW (*)    All other components within normal limits  SARS CORONAVIRUS 2 (TAT 6-24 HRS)  D-DIMER, QUANTITATIVE (NOT AT [facility])  BASIC METABOLIC PANEL  PROCALCITONIN  MAGNESIUM  TROPONIN I (HIGH SENSITIVITY)  TROPONIN I (HIGH SENSITIVITY)    EKG EKG Interpretation  Date/Time:  Monday June 27 2019 16:36:47 EDT Ventricular Rate:  97 PR Interval:    QRS Duration: 125 QT Interval:  344 QTC Calculation: 437 R Axis:   76 Text Interpretation: Sinus rhythm Left bundle branch block Confirmed by Lacretia Leigh ([id number]) on 06/27/2019 5:43:57 PM   Radiology DG Chest 2 View  Result Date: 06/27/2019 CLINICAL DATA:  85 year old female with CHF exacerbation. EXAM: CHEST - 2 VIEW  COMPARISON:  Chest radiograph dated 06/19/2019. FINDINGS: Small bilateral pleural effusions with minimal bibasilar atelectasis. Pneumonia is not excluded. Clinical correlation is recommended. There is no pneumothorax. There is mild cardiomegaly. No vascular congestion or edema. Left pectoral AICD device. Median sternotomy wires. Osteopenia with degenerative changes of the spine. Age indeterminate, likely old, lower thoracic compression fractures as well as old compression fracture of L1 with vertebroplasty. Correlation with point tenderness recommended. IMPRESSION: 1. Small bilateral pleural effusions with minimal bibasilar atelectasis. Pneumonia is not excluded. 2. Mild cardiomegaly.  No congestion or edema. Electronically Signed   By: Anner Crete M.D.   On: 06/27/2019 18:16    Procedures Procedures (including critical care time)  Medications Ordered in ED Medications  aspirin EC tablet 81 mg (has no administration in time range)  carvedilol (COREG) tablet 3.125 mg (has no administration in time range)  ALPRAZolam (XANAX) tablet 0.5 mg (has no administration in time range)  pantoprazole (PROTONIX) EC tablet 40 mg (has no administration in time range)  acidophilus (RISAQUAD) capsule 1 capsule (has no administration in time range)  ferrous sulfate tablet 324 mg (has no administration in time range)  Vitamin D (Ergocalciferol) (DRISDOL) capsule 50,000 Units (has no administration in time range)  albuterol (PROVENTIL) (2.5 MG/3ML) 0.083% nebulizer solution 2.5 mg (has no administration in time range)  fluticasone (FLONASE) 50 MCG/ACT nasal spray 2 spray (has no administration in time range)  fluticasone furoate-vilanterol (BREO ELLIPTA) 100-25 MCG/INH 1 puff (has no administration in time range)  umeclidinium bromide (INCRUSE ELLIPTA) 62.5 MCG/INH 1 puff (has no administration in time range)  sodium chloride flush (NS) 0.9 % injection 3 mL (has no administration in time range)  sodium chloride  flush (NS) 0.9 % injection 3 mL (has no administration in time range)  0.9 %  sodium chloride infusion (has no administration in time range)  heparin  injection 5,000 Units (has no administration in time range)  furosemide (LASIX) injection 40 mg (40 mg Intravenous Given 06/27/19 2245)    ED Course  I have reviewed the triage vital signs and the nursing notes.  Pertinent labs & imaging results that were available during my care of the patient were reviewed by me and considered in my medical decision making (see chart for details).    MDM Rules/Calculators/A&P                      85 year old female with a past medical history of HFrEF, status post AICD placement, CAD status post CABG, previous PEs, COPD, HLD, HTN presenting to the emergency department complaining of worsening shortness of breath began this morning at 5 AM.  Differential diagnoses considered include dysrhythmia, ACS, PE, AICD dysfunction, pneumonia, COPD exacerbation, CHF exacerbation, worsening renal failure, less likely PTX, dissection, pericarditis or myocarditis  ECG interpreted by me demonstrated Sinus rhythm at 97 bpm, LBBB, LAE, PRWP, no ST or T wave changes admit to service criteria, no changes suggestive of acute ischemia, overall similar compared to previous on 09/04/2017  CXR interpreted by me demonstrated small bilateral pleural effusions with minimal bibasilar atelectasis, mild cardiomegaly  Labs demonstrated mild serum hyperglycemia to 193, creatinine elevation of 1.61 with a BUN elevation at 34, otherwise nursing arrangement on BMP, normocytic anemia with a hemoglobin 10.7 and MCV of 96.4, no significant leukocytosis, no significant thrombocytopenia or thrombocytosis with platelet count of 291, BNP elevated to 446.8, initial troponin 14, d-dimer prending  Patient has a [employer] AICD, will interrogate  Upon reassessment she continues to be tachypneic speaking in short phrases and complaining of shortness of  breath.  Based on the patient's elevated BNP and chest x-ray with bilateral pleural effusions as well as her diminished breath sounds and wheezes at the apices, I am concerned the patient has acute on chronic heart failure exacerbation warranting admission in the setting of increased work of breathing and acute AKI  AICD interrogation unremarkable without any shocks given.   We will admit to hospitalist for ongoing evaluation, monitoring, and management.   The plan for this patient was discussed with Dr. Zenia Resides, who voiced agreement and who oversaw evaluation and treatment of this patient.  Final Clinical Impression(s) / ED Diagnoses Final diagnoses:  SOB (shortness of breath)    Rx / DC Orders ED Discharge Orders    None       Filbert Berthold, MD 06/27/19 2349    Lacretia Leigh, MD 06/28/19 1245

## 2019-06-27 NOTE — H&P (Addendum)
History and Physical    Brandi Gardner CVE:[id number] DOB: 1934/09/29 DOA: 06/27/2019  PCP: Cyndi Bender, PA-C Patient coming from: Home  Chief Complaint: Shortness of breath, ICD dysfunction  HPI: Brandi Gardner is a 85 y.o. female with medical history significant of chronic systolic CHF status post AICD, CAD status post CABG, COPD, chronic respiratory failure on continuous 2 L home oxygen, history of PE currently not on anticoagulation, hypertension, hyperlipidemia, anxiety presenting with complaints of shortness of breath and ICD dysfunction.  Patient reports 1 month history of progressively worsening dyspnea on exertion, cough, and bilateral lower extremity edema.  Denies fevers or chills.  Reports taking Lasix 80 mg daily.  States she had a blood clot in her left lung back in either 1989 or 1990 and was on anticoagulation at that time.  Currently not on anticoagulation.  ED Course: Afebrile.  Slightly tachypneic on arrival.  Placed on 2 L supplemental oxygen.  Labs showing no leukocytosis.  Hemoglobin 10.7, stable compared to prior labs.  BUN 34, creatinine 1.6.  Creatinine was ranging between 1.0-1.4 in June 2019.  BNP 446.  High-sensitivity troponin negative x2.  EKG without acute changes.  Chest x-ray showing small bilateral pleural effusions with minimal basilar atelectasis.  Pneumonia is not excluded.  Mild cardiomegaly.  No congestion or edema.  AICD interrogation -multiple episodes of NSVT, most recent on 4/19.  No shocks delivered.  IV Lasix 40 mg ordered.  Review of Systems:  All systems reviewed and apart from history of presenting illness, are negative.  Past Medical History:  Diagnosis Date  . Acute on chronic systolic (congestive) heart failure ([place]) 04/24/2015  . AICD (automatic cardioverter/defibrillator) present   . Anxiety   . Cardiac defibrillator in place 04/25/2015  . CHF (congestive heart failure) ([place])   . Chronic obstructive pulmonary disease with acute  exacerbation ([place]) 04/22/2015  . COPD (chronic obstructive pulmonary disease) ([place])   . Coronary artery disease   . Hx of pulmonary embolus 1980  . Hyperlipidemia   . Hypertension   . Ischemic cardiomyopathy 05/24/2015  . S/P CABG (coronary artery bypass graft) 05/24/2015    Past Surgical History:  Procedure Laterality Date  . CARDIAC CATHETERIZATION    . CORONARY ARTERY BYPASS GRAFT    . IR GENERIC HISTORICAL  11/08/2015   IR RADIOLOGIST EVAL & MGMT 11/08/2015 [facility]  . IR GENERIC HISTORICAL  11/13/2015   IR VERTEBROPLASTY LUMBAR BX INC UNI/BIL INC/INJECT/IMAGING 11/13/2015 Luanne Bras, MD [facility]  . IR GENERIC HISTORICAL  12/07/2015   IR RADIOLOGIST EVAL & MGMT 12/07/2015 [facility]  . TUBAL LIGATION       reports that she quit smoking about 26 years ago. She has never used smokeless tobacco. She reports that she does not drink alcohol or use drugs.  Allergies  Allergen Reactions  . Diphenhydramine Shortness Of Breath, Rash and Other (See Comments)    Has COPD, also   . Doxycycline Itching  . Hydrocodone Shortness Of Breath, Itching and Rash  . Oxycodone Hives, Swelling and Other (See Comments)    Percocet = Throat Swelling caused her to go to ER     . Penicillins Other (See Comments)    Headache, childhood allergy   . Potassium-Containing Compounds Other (See Comments)    Mouth/jaw pain  . Tramadol Itching, Swelling, Rash and Other (See Comments)    Rash and Throat swelling caused her to go to ER   . Hydrocodone-Acetaminophen Hives  . Other Rash and Other (See Comments)  Oxygen tubing- BREAKS OUT THE SKIN IN RED, RAISED PATCHES  . Percocet [Oxycodone-Acetaminophen] Hives  . Vicodin [Hydrocodone-Acetaminophen] Hives  . Potassium Other (See Comments)    Pain in the mouth/jaws  . Statins Nausea Only    Family History  Problem Relation Age of Onset  . Hypertension Mother   . Uterine cancer Mother   . Hypertension Father     Prior to Admission  medications   Medication Sig Start Date End Date Taking? Authorizing Provider  acetaminophen (TYLENOL) 500 MG tablet Take 500 mg by mouth every 6 (six) hours as needed for mild pain or headache.    Yes [provider]  albuterol (PROAIR HFA) 108 (90 Base) MCG/ACT inhaler Inhale 2 puffs into the lungs every 6 (six) hours as needed for wheezing or shortness of breath.   Yes [provider]  albuterol (PROVENTIL) (2.5 MG/3ML) 0.083% nebulizer solution Take 2.5 mg by nebulization in the morning and at bedtime.   Yes [provider]  ALPRAZolam Duanne Moron) 1 MG tablet Take 1 tablet (1 mg total) by mouth 2 (two) times daily as needed for anxiety. Patient taking differently: Take 1 mg by mouth daily.  08/30/17  Yes Guadalupe Dawn, MD  aspirin EC 81 MG tablet Take 81 mg by mouth daily.   Yes [provider]  budesonide (PULMICORT) 0.5 MG/2ML nebulizer solution Take 0.5 mg by nebulization in the morning and at bedtime.   Yes [provider]  carvedilol (COREG) 3.125 MG tablet Take 3.125 mg by mouth 2 (two) times daily with a meal.   Yes [provider]  D3-50 50000 units capsule Take 50,000 Units by mouth every Tuesday.  08/21/17  Yes [provider]  diphenhydrAMINE-PE-APAP (EQ ALLERGY & SINUS HEADACHE PO) Take 1 tablet by mouth every 6 (six) hours as needed (for sinus symptoms/congestion).    Yes [provider]  ferrous sulfate 324 (65 Fe) MG TBEC Take 324 mg by mouth daily with breakfast.  06/09/17  Yes [provider]  fluticasone (FLONASE) 50 MCG/ACT nasal spray Place 2 sprays into both nostrils daily.   Yes [provider]  fluticasone furoate-vilanterol (BREO ELLIPTA) 100-25 MCG/INH AEPB Inhale 1 puff into the lungs daily.   Yes [provider]  furosemide (LASIX) 40 MG tablet Take 1 tablet (40 mg total) by mouth 2 (two) times daily. Patient taking differently: Take 80 mg by mouth in the morning.  08/30/17  Yes  Guadalupe Dawn, MD  nitroGLYCERIN (NITROSTAT) 0.4 MG SL tablet Place 0.4 mg under the tongue every 5 (five) minutes as needed for chest pain.   Yes [provider]  nystatin-triamcinolone (MYCOLOG II) cream Apply 1 application topically 2 (two) times daily as needed (for yeast).   Yes [provider]  OXYGEN Inhale 2 L/min into the lungs continuous.   Yes [provider]  pantoprazole (PROTONIX) 40 MG tablet Take 40 mg by mouth daily before breakfast.  06/09/17  Yes [provider]  Probiotic CAPS Take 1 capsule by mouth daily.   Yes [provider]  spironolactone (ALDACTONE) 25 MG tablet Take 25 mg by mouth daily.   Yes [provider]  tiotropium (SPIRIVA) 18 MCG inhalation capsule Place 18 mcg into inhaler and inhale daily.   Yes [provider]  tiZANidine (ZANAFLEX) 4 MG tablet Take 4 mg by mouth every 8 (eight) hours as needed (for back pain).  08/21/17  Yes [provider]  fluticasone furoate-vilanterol (BREO ELLIPTA) 200-25 MCG/INH AEPB  Inhale 1 puff into the lungs daily. Patient not taking: Reported on 06/27/2019 07/21/17   Marshell Garfinkel, MD    Physical Exam: Vitals:   06/27/19 2145 06/27/19 2215 06/27/19 2230 06/27/19 2245  BP: (!) 128/52 125/73 116/61 123/60  Pulse: 79 77 79 80  Resp: 20 18 19 18   Temp:      TempSrc:      SpO2: 100% 100% 100% 100%  Weight:      Height:        Physical Exam  Constitutional: She is oriented to person, place, and time. She appears well-developed and well-nourished. No distress.  HENT:  Head: Normocephalic.  Eyes: Right eye exhibits no discharge. Left eye exhibits no discharge.  Neck: JVD present.  Cardiovascular: Normal rate, regular rhythm and intact distal pulses.  Pulmonary/Chest: She has no wheezes. She has no rales.  Slightly tachypneic with respiratory rate in the low 20s Satting 100% on 2 L supplemental oxygen  Abdominal: Soft. Bowel sounds are normal. She  exhibits no distension. There is no abdominal tenderness. There is no guarding.  Musculoskeletal:        General: Edema present.     Cervical back: Neck supple.     Comments: +1 to +2 pitting edema bilateral lower extremities  Neurological: She is alert and oriented to person, place, and time.  Skin: Skin is warm and dry. She is not diaphoretic.     Labs on Admission: I have personally reviewed following labs and imaging studies  CBC: Recent Labs  Lab 06/27/19 1734  WBC 9.4  NEUTROABS 7.7  HGB 10.7*  HCT 35.0*  MCV 96.4  PLT 841   Basic Metabolic Panel: Recent Labs  Lab 06/27/19 1734  NA 137  K 3.7  CL 99  CO2 27  GLUCOSE 193*  BUN 34*  CREATININE 1.64*  CALCIUM 8.9   GFR: Estimated Creatinine Clearance: 24.9 mL/min (A) (by C-G formula based on SCr of 1.64 mg/dL (H)). Liver Function Tests: No results for input(s): AST, ALT, ALKPHOS, BILITOT, PROT, ALBUMIN in the last 168 hours. No results for input(s): LIPASE, AMYLASE in the last 168 hours. No results for input(s): AMMONIA in the last 168 hours. Coagulation Profile: No results for input(s): INR, PROTIME in the last 168 hours. Cardiac Enzymes: No results for input(s): CKTOTAL, CKMB, CKMBINDEX, TROPONINI in the last 168 hours. BNP (last 3 results) No results for input(s): PROBNP in the last 8760 hours. HbA1C: No results for input(s): HGBA1C in the last 72 hours. CBG: No results for input(s): GLUCAP in the last 168 hours. Lipid Profile: No results for input(s): CHOL, HDL, LDLCALC, TRIG, CHOLHDL, LDLDIRECT in the last 72 hours. Thyroid Function Tests: No results for input(s): TSH, T4TOTAL, FREET4, T3FREE, THYROIDAB in the last 72 hours. Anemia Panel: No results for input(s): VITAMINB12, FOLATE, FERRITIN, TIBC, IRON, RETICCTPCT in the last 72 hours. Urine analysis:    Component Value Date/Time   COLORURINE STRAW (A) 08/26/2017 1615   APPEARANCEUR CLEAR 08/26/2017 1615   LABSPEC 1.006 08/26/2017 1615    PHURINE 5.0 08/26/2017 1615   GLUCOSEU NEGATIVE 08/26/2017 1615   HGBUR NEGATIVE 08/26/2017 1615   BILIRUBINUR NEGATIVE 08/26/2017 1615   KETONESUR NEGATIVE 08/26/2017 1615   PROTEINUR NEGATIVE 08/26/2017 1615   NITRITE NEGATIVE 08/26/2017 1615   LEUKOCYTESUR NEGATIVE 08/26/2017 1615    Radiological Exams on Admission: DG Chest 2 View  Result Date: 06/27/2019 CLINICAL DATA:  85 year old female with CHF exacerbation. EXAM: CHEST - 2 VIEW COMPARISON:  Chest radiograph dated  06/19/2019. FINDINGS: Small bilateral pleural effusions with minimal bibasilar atelectasis. Pneumonia is not excluded. Clinical correlation is recommended. There is no pneumothorax. There is mild cardiomegaly. No vascular congestion or edema. Left pectoral AICD device. Median sternotomy wires. Osteopenia with degenerative changes of the spine. Age indeterminate, likely old, lower thoracic compression fractures as well as old compression fracture of L1 with vertebroplasty. Correlation with point tenderness recommended. IMPRESSION: 1. Small bilateral pleural effusions with minimal bibasilar atelectasis. Pneumonia is not excluded. 2. Mild cardiomegaly.  No congestion or edema. Electronically Signed   By: Anner Crete M.D.   On: 06/27/2019 18:16    EKG: Independently reviewed.  Sinus rhythm, LBBB.  No significant change compared to prior tracing.  Assessment/Plan Principal Problem:   Acute exacerbation of CHF (congestive heart failure) (HCC) Active Problems:   Chronic respiratory failure with hypoxia (HCC)   NSVT (nonsustained ventricular tachycardia) (HCC)   COPD (chronic obstructive pulmonary disease) (HCC)   AKI (acute kidney injury) (HCC)   Dyspnea, suspect secondary to acute exacerbation of chronic systolic CHF: Last echo done June 2019 with LVEF 25 to 30% and grade 1 diastolic dysfunction.  Status post AICD -interrogation done in the ED showing multiple episodes of NSVT and no shocks.  BNP 446.  Does have signs of  volume overload including JVD and peripheral edema.  Chest x-ray showing small bilateral pleural effusions without pulmonary congestion or edema.  PE is also on the differential given prior history, although no increase in oxygen requirement from baseline and patient is not tachycardic. -Continue cardiac monitoring.  Patient was given IV Lasix 40 mg.  Please reassess in a.m. and order additional doses.  Continue home Coreg.  Monitor intake and output, daily weights, and low-sodium diet with fluid restriction. -Order D-dimer, if elevated, unable to do CT angiogram due to renal insufficiency/low GFR.  Start heparin empirically and order VQ scan for a.m. -Pneumonia less likely given no fever or leukocytosis.  Check procalcitonin level.  Addendum: Repeat echocardiogram has also been ordered.  Chronic hypoxic respiratory failure: Stable on 2 L home oxygen. -Continue supplemental oxygen, continuous pulse ox  NSVT: AICD interrogation showing multiple episodes of NSVT, most recent on 4/19.  No shocks delivered. -Monitor potassium and magnesium levels.  Continue cardiac monitoring.  COPD: Stable.  No wheezing.  Continue home inhalers.  AKI on CKD stage III: Creatinine 1.6, was previously ranging between 1.0-1.4 in June 2019.  GFR currently 29.  AKI is possibly cardiorenal due to acutely decompensated CHF. -Lasix as above.  Repeat labs in a.m. to assess renal function.  Monitor urine output.  Avoid contrast/nephrotoxic agents.  DVT prophylaxis: Subcutaneous heparin Code Status: Patient wishes to be full code. Family Communication: No family available at this time. Disposition Plan: Anticipate discharge in 1 to 2 days. Admission status: It is my clinical opinion that referral for OBSERVATION is reasonable and necessary in this patient based on the above information provided. The aforementioned taken together are felt to place the patient at high risk for further clinical deterioration. However it is  anticipated that the patient may be medically stable for discharge from the hospital within 24 to 48 hours.  The medical decision making on this patient was of high complexity and the patient is at high risk for clinical deterioration, therefore this is a level 3 visit.  Shela Leff MD [facility]  If 7PM-7AM, please contact night-coverage [email or web address]  06/27/2019, 11:09 PM

## 2019-06-27 NOTE — ED Triage Notes (Signed)
Pt arrives to ED from home where she lives by herself with complaints of worsening shortness of breath this morning at 5am when she heard her ICD make a sound and go off. Patient states she is chronically SOB but today has been much worse. Patient states she was at [facility] last week which they told her that her kidneys were failing and she had fluid around her heart.

## 2019-06-27 NOTE — ED Notes (Signed)
Pt's [employer] Pacemaker interrogated  .

## 2019-06-27 NOTE — ED Provider Notes (Signed)
I saw and evaluated the patient, reviewed the resident's note and I agree with the findings and plan.  EKG: EKG Interpretation  Date/Time:  Monday June 27 2019 16:36:47 EDT Ventricular Rate:  97 PR Interval:    QRS Duration: 125 QT Interval:  344 QTC Calculation: 437 R Axis:   76 Text Interpretation: Sinus rhythm Left bundle branch block Confirmed by Lacretia Leigh ([id number]) on 06/27/2019 5:49:30 PM 85 year old female who presents with increasing shortness of breath.  States she is currently short of breath.  X-ray shows questionable pneumonia.  She does have an oxygen requirement.  Patient will require admission   Lacretia Leigh, MD 06/27/19 1918

## 2019-06-28 ENCOUNTER — Observation Stay (HOSPITAL_BASED_OUTPATIENT_CLINIC_OR_DEPARTMENT_OTHER): Payer: Medicare Other

## 2019-06-28 ENCOUNTER — Observation Stay (HOSPITAL_COMMUNITY): Payer: Medicare Other

## 2019-06-28 DIAGNOSIS — Z20822 Contact with and (suspected) exposure to covid-19: Secondary | ICD-10-CM | POA: Diagnosis present

## 2019-06-28 DIAGNOSIS — Z8049 Family history of malignant neoplasm of other genital organs: Secondary | ICD-10-CM | POA: Diagnosis not present

## 2019-06-28 DIAGNOSIS — Z87891 Personal history of nicotine dependence: Secondary | ICD-10-CM | POA: Diagnosis not present

## 2019-06-28 DIAGNOSIS — I5023 Acute on chronic systolic (congestive) heart failure: Secondary | ICD-10-CM

## 2019-06-28 DIAGNOSIS — N179 Acute kidney failure, unspecified: Secondary | ICD-10-CM | POA: Diagnosis present

## 2019-06-28 DIAGNOSIS — E785 Hyperlipidemia, unspecified: Secondary | ICD-10-CM | POA: Diagnosis present

## 2019-06-28 DIAGNOSIS — I130 Hypertensive heart and chronic kidney disease with heart failure and stage 1 through stage 4 chronic kidney disease, or unspecified chronic kidney disease: Secondary | ICD-10-CM | POA: Diagnosis present

## 2019-06-28 DIAGNOSIS — F419 Anxiety disorder, unspecified: Secondary | ICD-10-CM | POA: Diagnosis present

## 2019-06-28 DIAGNOSIS — Z885 Allergy status to narcotic agent status: Secondary | ICD-10-CM | POA: Diagnosis not present

## 2019-06-28 DIAGNOSIS — D631 Anemia in chronic kidney disease: Secondary | ICD-10-CM | POA: Diagnosis present

## 2019-06-28 DIAGNOSIS — J431 Panlobular emphysema: Secondary | ICD-10-CM

## 2019-06-28 DIAGNOSIS — Z7951 Long term (current) use of inhaled steroids: Secondary | ICD-10-CM | POA: Diagnosis not present

## 2019-06-28 DIAGNOSIS — I5043 Acute on chronic combined systolic (congestive) and diastolic (congestive) heart failure: Secondary | ICD-10-CM | POA: Diagnosis present

## 2019-06-28 DIAGNOSIS — Z79899 Other long term (current) drug therapy: Secondary | ICD-10-CM | POA: Diagnosis not present

## 2019-06-28 DIAGNOSIS — Z86711 Personal history of pulmonary embolism: Secondary | ICD-10-CM | POA: Diagnosis not present

## 2019-06-28 DIAGNOSIS — I447 Left bundle-branch block, unspecified: Secondary | ICD-10-CM | POA: Diagnosis present

## 2019-06-28 DIAGNOSIS — N1831 Chronic kidney disease, stage 3a: Secondary | ICD-10-CM

## 2019-06-28 DIAGNOSIS — Z8249 Family history of ischemic heart disease and other diseases of the circulatory system: Secondary | ICD-10-CM | POA: Diagnosis not present

## 2019-06-28 DIAGNOSIS — Z95810 Presence of automatic (implantable) cardiac defibrillator: Secondary | ICD-10-CM | POA: Diagnosis not present

## 2019-06-28 DIAGNOSIS — J441 Chronic obstructive pulmonary disease with (acute) exacerbation: Secondary | ICD-10-CM | POA: Diagnosis present

## 2019-06-28 DIAGNOSIS — I255 Ischemic cardiomyopathy: Secondary | ICD-10-CM | POA: Diagnosis present

## 2019-06-28 DIAGNOSIS — R0602 Shortness of breath: Secondary | ICD-10-CM | POA: Diagnosis not present

## 2019-06-28 DIAGNOSIS — I2510 Atherosclerotic heart disease of native coronary artery without angina pectoris: Secondary | ICD-10-CM | POA: Diagnosis present

## 2019-06-28 DIAGNOSIS — N1832 Chronic kidney disease, stage 3b: Secondary | ICD-10-CM | POA: Diagnosis present

## 2019-06-28 DIAGNOSIS — I5021 Acute systolic (congestive) heart failure: Secondary | ICD-10-CM | POA: Diagnosis not present

## 2019-06-28 DIAGNOSIS — J9611 Chronic respiratory failure with hypoxia: Secondary | ICD-10-CM | POA: Diagnosis present

## 2019-06-28 DIAGNOSIS — Z7982 Long term (current) use of aspirin: Secondary | ICD-10-CM | POA: Diagnosis not present

## 2019-06-28 DIAGNOSIS — I472 Ventricular tachycardia: Secondary | ICD-10-CM | POA: Diagnosis present

## 2019-06-28 DIAGNOSIS — Z951 Presence of aortocoronary bypass graft: Secondary | ICD-10-CM | POA: Diagnosis not present

## 2019-06-28 LAB — ECHOCARDIOGRAM COMPLETE
Height: 64 in
Weight: 2368 oz

## 2019-06-28 LAB — BASIC METABOLIC PANEL
Anion gap: 11 (ref 5–15)
BUN: 35 mg/dL — ABNORMAL HIGH (ref 8–23)
CO2: 31 mmol/L (ref 22–32)
Calcium: 9 mg/dL (ref 8.9–10.3)
Chloride: 98 mmol/L (ref 98–111)
Creatinine, Ser: 1.68 mg/dL — ABNORMAL HIGH (ref 0.44–1.00)
GFR calc Af Amer: 32 mL/min — ABNORMAL LOW (ref >60)
GFR calc non Af Amer: 28 mL/min — ABNORMAL LOW (ref >60)
Glucose, Bld: 159 mg/dL — ABNORMAL HIGH (ref 70–99)
Potassium: 5.1 mmol/L (ref 3.5–5.1)
Sodium: 140 mmol/L (ref 135–145)

## 2019-06-28 LAB — MAGNESIUM: Magnesium: 2.3 mg/dL (ref 1.7–2.4)

## 2019-06-28 LAB — PROCALCITONIN: Procalcitonin: 0.19 ng/mL

## 2019-06-28 LAB — HEPARIN LEVEL (UNFRACTIONATED): Heparin Unfractionated: 0.97 IU/mL — ABNORMAL HIGH (ref 0.30–0.70)

## 2019-06-28 LAB — SARS CORONAVIRUS 2 (TAT 6-24 HRS): SARS Coronavirus 2: NEGATIVE

## 2019-06-28 MED ORDER — POLYETHYLENE GLYCOL 3350 17 G PO PACK
17.0000 g | PACK | Freq: Every day | ORAL | Status: DC
  Administered 2019-06-29 – 2019-07-01 (×3): 17 g via ORAL
  Filled 2019-06-28 (×3): qty 1

## 2019-06-28 MED ORDER — FUROSEMIDE 10 MG/ML IJ SOLN
40.0000 mg | Freq: Two times a day (BID) | INTRAMUSCULAR | Status: DC
  Administered 2019-06-28 – 2019-06-30 (×4): 40 mg via INTRAVENOUS
  Filled 2019-06-28 (×4): qty 4

## 2019-06-28 MED ORDER — TECHNETIUM TC 99M DIETHYLENETRIAME-PENTAACETIC ACID
40.3000 | Freq: Once | INTRAVENOUS | Status: AC | PRN
  Administered 2019-06-28: 40.3 via RESPIRATORY_TRACT

## 2019-06-28 MED ORDER — TECHNETIUM TO 99M ALBUMIN AGGREGATED
1.8000 | Freq: Once | INTRAVENOUS | Status: AC | PRN
  Administered 2019-06-28: 1.8 via INTRAVENOUS

## 2019-06-28 MED ORDER — HEPARIN BOLUS VIA INFUSION
3500.0000 [IU] | Freq: Once | INTRAVENOUS | Status: AC
  Administered 2019-06-28: 3500 [IU] via INTRAVENOUS
  Filled 2019-06-28: qty 3500

## 2019-06-28 MED ORDER — IPRATROPIUM-ALBUTEROL 0.5-2.5 (3) MG/3ML IN SOLN
3.0000 mL | Freq: Four times a day (QID) | RESPIRATORY_TRACT | Status: DC | PRN
  Administered 2019-06-28: 3 mL via RESPIRATORY_TRACT
  Filled 2019-06-28: qty 3

## 2019-06-28 MED ORDER — HEPARIN SODIUM (PORCINE) 5000 UNIT/ML IJ SOLN
5000.0000 [IU] | Freq: Three times a day (TID) | INTRAMUSCULAR | Status: DC
  Administered 2019-06-28 – 2019-07-01 (×9): 5000 [IU] via SUBCUTANEOUS
  Filled 2019-06-28 (×8): qty 1

## 2019-06-28 MED ORDER — HEPARIN (PORCINE) 25000 UT/250ML-% IV SOLN
950.0000 [IU]/h | INTRAVENOUS | Status: DC
  Administered 2019-06-28: 1150 [IU]/h via INTRAVENOUS
  Filled 2019-06-28: qty 250

## 2019-06-28 MED ORDER — DOCUSATE SODIUM 100 MG PO CAPS
100.0000 mg | ORAL_CAPSULE | Freq: Two times a day (BID) | ORAL | Status: DC
  Administered 2019-06-28 – 2019-06-30 (×5): 100 mg via ORAL
  Filled 2019-06-28 (×5): qty 1

## 2019-06-28 NOTE — ED Notes (Signed)
Pt placed onto inpatient stretcher for comfort while boarding in the ED.

## 2019-06-28 NOTE — Progress Notes (Signed)
ANTICOAGULATION CONSULT NOTE - Follow Up Consult  Pharmacy Consult for Heparin Indication: pulmonary embolus  Allergies  Allergen Reactions  . Diphenhydramine Shortness Of Breath, Rash and Other (See Comments)    Has COPD, also   . Doxycycline Itching  . Hydrocodone Shortness Of Breath, Itching and Rash  . Oxycodone Hives, Swelling and Other (See Comments)    Percocet = Throat Swelling caused her to go to ER     . Penicillins Other (See Comments)    Headache, childhood allergy   . Potassium-Containing Compounds Other (See Comments)    Mouth/jaw pain  . Tramadol Itching, Swelling, Rash and Other (See Comments)    Rash and Throat swelling caused her to go to ER   . Hydrocodone-Acetaminophen Hives  . Other Rash and Other (See Comments)    Oxygen tubing- BREAKS OUT THE SKIN IN RED, RAISED PATCHES  . Percocet [Oxycodone-Acetaminophen] Hives  . Vicodin [Hydrocodone-Acetaminophen] Hives  . Potassium Other (See Comments)    Pain in the mouth/jaws  . Statins Nausea Only    Patient Measurements: Height: 5\' 4"  (162.6 cm) Weight: 67.1 kg (148 lb) IBW/kg (Calculated) : 54.7 Heparin Dosing Weight: 67.1 kg  Vital Signs: Temp: 97.5 F (36.4 C) (04/20 1246) Temp Source: Oral (04/20 1246) BP: 120/72 (04/20 1246) Pulse Rate: 85 (04/20 1246)  Labs: Recent Labs    06/27/19 1734 06/27/19 1922 06/28/19 0942  HGB 10.7*  --   --   HCT 35.0*  --   --   PLT 291  --   --   HEPARINUNFRC  --   --  0.97*  CREATININE 1.64*  --  1.68*  TROPONINIHS 14 17  --     Estimated Creatinine Clearance: 24.3 mL/min (A) (by C-G formula based on SCr of 1.68 mg/dL (H)).  Assessment:  85 yr old female admitted 4/19 with shortness of breath. Hx PE in remote past, not on anticoagulation prior to admission.  D-dimer up 1.55.  IV heparin begun for r/o PE.  VQ scan pending.    Initial heparin level is supratherapeutic (0.97) on 1150 units/hr.  Goal of Therapy:  Heparin level 0.3-0.7 units/ml Monitor  platelets by anticoagulation protocol: Yes   Plan:   Decrease heparin drip to 950 units/hr  Heparin level ~8 hrs after rate change.  Daily heparin level and CBC while on heparin.  Arty Baumgartner, [place] Phone: [phone] 06/28/2019,12:56 PM

## 2019-06-28 NOTE — Progress Notes (Signed)
ANTICOAGULATION CONSULT NOTE - Initial Consult  Pharmacy Consult for heparin Indication: pulmonary embolus  Allergies  Allergen Reactions  . Diphenhydramine Shortness Of Breath, Rash and Other (See Comments)    Has COPD, also   . Doxycycline Itching  . Hydrocodone Shortness Of Breath, Itching and Rash  . Oxycodone Hives, Swelling and Other (See Comments)    Percocet = Throat Swelling caused her to go to ER     . Penicillins Other (See Comments)    Headache, childhood allergy   . Potassium-Containing Compounds Other (See Comments)    Mouth/jaw pain  . Tramadol Itching, Swelling, Rash and Other (See Comments)    Rash and Throat swelling caused her to go to ER   . Hydrocodone-Acetaminophen Hives  . Other Rash and Other (See Comments)    Oxygen tubing- BREAKS OUT THE SKIN IN RED, RAISED PATCHES  . Percocet [Oxycodone-Acetaminophen] Hives  . Vicodin [Hydrocodone-Acetaminophen] Hives  . Potassium Other (See Comments)    Pain in the mouth/jaws  . Statins Nausea Only    Patient Measurements: Height: 5\' 4"  (162.6 cm) Weight: 67.1 kg (148 lb) IBW/kg (Calculated) : 54.7 Heparin Dosing Weight: 67.1 kg  Vital Signs: Temp: 97.9 F (36.6 C) (04/19 1637) Temp Source: Oral (04/19 1637) BP: 123/60 (04/19 2245) Pulse Rate: 80 (04/19 2245)  Labs: Recent Labs    06/27/19 1734 06/27/19 1922  HGB 10.7*  --   HCT 35.0*  --   PLT 291  --   CREATININE 1.64*  --   TROPONINIHS 14 17    Estimated Creatinine Clearance: 24.9 mL/min (A) (by C-G formula based on SCr of 1.64 mg/dL (H)).   Medical History: Past Medical History:  Diagnosis Date  . Acute on chronic systolic (congestive) heart failure ([place]) 04/24/2015  . AICD (automatic cardioverter/defibrillator) present   . Anxiety   . Cardiac defibrillator in place 04/25/2015  . CHF (congestive heart failure) ([place])   . Chronic obstructive pulmonary disease with acute exacerbation ([place]) 04/22/2015  . COPD (chronic obstructive  pulmonary disease) ([place])   . Coronary artery disease   . Hx of pulmonary embolus 1980  . Hyperlipidemia   . Hypertension   . Ischemic cardiomyopathy 05/24/2015  . S/P CABG (coronary artery bypass graft) 05/24/2015    Medications:  See med history  Assessment: 85 yo lady with h/o PE not on anticoagulation to start heparin for r/o PE.  Hg 10.7, PTLC 291 Goal of Therapy:  Heparin level 0.3-0.7 units/ml Monitor platelets by anticoagulation protocol: Yes   Plan:  Heparin bolus 3500 units and drip at 1150 units/hr Check heparin level 6-8 hours after start Daily HL and CBC F/u VQ scan  Brandi Gardner 06/28/2019,12:25 AM

## 2019-06-28 NOTE — Progress Notes (Signed)
PT Cancellation Note  Patient Details Name: Brandi Gardner MRN: [id number] DOB: 05/11/1934   Cancelled Treatment:    Reason Eval/Treat Not Completed: Patient declined, no reason specified  Pt declined PT today due to fatigue.  Reports she has had a busy day and would rather wait till tomorrow. Maggie Font, PT Acute Rehab Services Pager [phone] [facility] 06/28/2019, 4:11 PM

## 2019-06-28 NOTE — Consult Note (Addendum)
The patient has been seen in conjunction with Rosaria Ferries, PA-C. All aspects of care have been considered and discussed. The patient has been personally interviewed, examined, and all clinical data has been reviewed.   Several month history of exertional dyspnea.  Also complains of a feeling of tightness in her chest.  She has longstanding known ischemic cardiomyopathy in her medication regimen is not optimal.  It is missing an ACE/ARB/Arni.  All records suggest that she is on spironolactone and she confirms that she takes this daily.  In looking at prior cardiology notes, she has been on long-acting nitrates but it is not currently listed as a component of her home medications.  Based on exam she is not significantly volume overloaded.  Agree with 1-2 doses of IV Lasix.  Need to be careful not to over diurese the patient.  If blood pressure allows either long-acting nitrate or angiotensin/renin/aldosterone inhibitor should be added at a dose that is tolerable based on blood pressure.     Cardiology Consultation:   Patient ID: Brandi Gardner; [id number]; Jun 29, 1934   Admit date: 06/27/2019 Date of Consult: 06/28/2019  Primary Care Provider: Cyndi Bender, PA-C Primary Cardiologist: Mathis Bud, MD 11/23/2018 Primary Electrophysiologist:  Edward Qualia, PA   Patient Profile:   Brandi Quant is a 85 y.o. female with a hx of S-CHF, EF 20% s/p MDT VISIA 04/2015, VT & VF arrest 07/24/2017 tx from [facility] to [facility] but elevated Cr so cath not done, CKD III, COPD on O2, CAD s/p CABG, HTN, HLD, PE, who is being seen today for the evaluation of CHF at the request of Dr Marlowe Sax.  History of Present Illness:   Ms. Campoverde occasionally gets a funny feeling in her upper L chest. Her ICD has never shocked her. She has never passed out.   She has been wearing her home oxygen almost continuously for about 2 years.  She feels that her breathing started getting worse a  month to 6 weeks ago.  She will walk in the house and has a circuit that she does.  She tries to do this every day, even though it is hard for her.  She denies orthopnea and PND.  However, when she wakes up in the morning, she will feel that she has to get up and get her inhaler because she has some shortness of breath.  The last few days, she has noticed some lower extremity edema.  She does weigh herself regularly.  Her weight is somewhere between 145 and 150 pounds.  It is usually closer to 145 pounds, she was a little surprised that she was up to 148.  She does not add salt to food, but eats some prepared foods that have sodium in them.  She gets Meals on Wheels but feels the meats have a lot of sodium in them.  She does not eat much of that.  Since being in the hospital, she is breathing better.  Sometimes when her breathing is bad, she will get a pressure feeling in the middle of her chest.  She is not having that now.  She does not have as much help that she used to have, 2 daughters have died this year.  One of her daughters had a fianc and he is the person that is going to the grocery store for her getting her food.   Past Medical History:  Diagnosis Date  . Acute on chronic systolic (congestive) heart failure ([place]) 04/24/2015  . AICD (automatic  cardioverter/defibrillator) present   . Anxiety   . Cardiac defibrillator in place 04/25/2015  . CHF (congestive heart failure) ([place])   . Chronic obstructive pulmonary disease with acute exacerbation ([place]) 04/22/2015  . COPD (chronic obstructive pulmonary disease) ([place])   . Coronary artery disease   . Hx of pulmonary embolus 1980  . Hyperlipidemia   . Hypertension   . Ischemic cardiomyopathy 05/24/2015  . S/P CABG (coronary artery bypass graft) 05/24/2015    Past Surgical History:  Procedure Laterality Date  . CARDIAC CATHETERIZATION    . CORONARY ARTERY BYPASS GRAFT    . IR GENERIC HISTORICAL  11/08/2015   IR RADIOLOGIST EVAL &  MGMT 11/08/2015 [facility]  . IR GENERIC HISTORICAL  11/13/2015   IR VERTEBROPLASTY LUMBAR BX INC UNI/BIL INC/INJECT/IMAGING 11/13/2015 Luanne Bras, MD [facility]  . IR GENERIC HISTORICAL  12/07/2015   IR RADIOLOGIST EVAL & MGMT 12/07/2015 [facility]  . TUBAL LIGATION       Prior to Admission medications   Medication Sig Start Date End Date Taking? Authorizing Provider  acetaminophen (TYLENOL) 500 MG tablet Take 500 mg by mouth every 6 (six) hours as needed for mild pain or headache.    Yes [provider]  albuterol (PROAIR HFA) 108 (90 Base) MCG/ACT inhaler Inhale 2 puffs into the lungs every 6 (six) hours as needed for wheezing or shortness of breath.   Yes [provider]  albuterol (PROVENTIL) (2.5 MG/3ML) 0.083% nebulizer solution Take 2.5 mg by nebulization in the morning and at bedtime.   Yes [provider]  ALPRAZolam Duanne Moron) 1 MG tablet Take 1 tablet (1 mg total) by mouth 2 (two) times daily as needed for anxiety. Patient taking differently: Take 1 mg by mouth daily.  08/30/17  Yes Guadalupe Dawn, MD  aspirin EC 81 MG tablet Take 81 mg by mouth daily.   Yes [provider]  budesonide (PULMICORT) 0.5 MG/2ML nebulizer solution Take 0.5 mg by nebulization in the morning and at bedtime.   Yes [provider]  carvedilol (COREG) 3.125 MG tablet Take 3.125 mg by mouth 2 (two) times daily with a meal.   Yes [provider]  D3-50 50000 units capsule Take 50,000 Units by mouth every Tuesday.  08/21/17  Yes [provider]  diphenhydrAMINE-PE-APAP (EQ ALLERGY & SINUS HEADACHE PO) Take 1 tablet by mouth every 6 (six) hours as needed (for sinus symptoms/congestion).    Yes [provider]  ferrous sulfate 324 (65 Fe) MG TBEC Take 324 mg by mouth daily with breakfast.  06/09/17  Yes [provider]  fluticasone (FLONASE) 50 MCG/ACT nasal spray Place 2 sprays into both nostrils daily.   Yes [provider]  fluticasone furoate-vilanterol (BREO ELLIPTA) 100-25 MCG/INH AEPB Inhale 1 puff into the lungs daily.   Yes [provider]  furosemide (LASIX) 40 MG tablet Take 1 tablet (40 mg total) by mouth 2 (two) times daily. Patient taking differently: Take 80 mg by mouth in the morning.  08/30/17  Yes Guadalupe Dawn, MD  nitroGLYCERIN (NITROSTAT) 0.4 MG SL tablet Place 0.4 mg under the tongue every 5 (five) minutes as needed for chest pain.   Yes [provider]  nystatin-triamcinolone (MYCOLOG II) cream Apply 1 application topically 2 (two) times daily as needed (for yeast).   Yes [provider]  OXYGEN Inhale 2 L/min into the lungs continuous.   Yes [provider]  pantoprazole (PROTONIX) 40 MG tablet Take 40 mg by mouth  daily before breakfast.  06/09/17  Yes [provider]  Probiotic CAPS Take 1 capsule by mouth daily.   Yes [provider]  spironolactone (ALDACTONE) 25 MG tablet Take 25 mg by mouth daily.   Yes [provider]  tiotropium (SPIRIVA) 18 MCG inhalation capsule Place 18 mcg into inhaler and inhale daily.   Yes [provider]  tiZANidine (ZANAFLEX) 4 MG tablet Take 4 mg by mouth every 8 (eight) hours as needed (for back pain).  08/21/17  Yes [provider]  fluticasone furoate-vilanterol (BREO ELLIPTA) 200-25 MCG/INH AEPB Inhale 1 puff into the lungs daily. Patient not taking: Reported on 06/27/2019 07/21/17   Marshell Garfinkel, MD    Inpatient Medications: Scheduled Meds: . acidophilus  1 capsule Oral Daily  . aspirin EC  81 mg Oral Daily  . carvedilol  3.125 mg Oral BID WC  . ferrous sulfate  324 mg Oral Q breakfast  . fluticasone  2 spray Each Nare Daily  . fluticasone furoate-vilanterol  1 puff Inhalation Daily  . furosemide  40 mg Intravenous Q12H  . pantoprazole  40 mg Oral QAC breakfast  . sodium chloride flush  3 mL Intravenous Q12H  . umeclidinium bromide  1 puff Inhalation Daily  . Vitamin  D (Ergocalciferol)  50,000 Units Oral Q Tue   Continuous Infusions: . sodium chloride    . heparin 950 Units/hr (06/28/19 1310)   PRN Meds: sodium chloride, ALPRAZolam, ipratropium-albuterol, sodium chloride flush  Allergies:    Allergies  Allergen Reactions  . Diphenhydramine Shortness Of Breath, Rash and Other (See Comments)    Has COPD, also   . Doxycycline Itching  . Hydrocodone Shortness Of Breath, Itching and Rash  . Oxycodone Hives, Swelling and Other (See Comments)    Percocet = Throat Swelling caused her to go to ER     . Penicillins Other (See Comments)    Headache, childhood allergy   . Potassium-Containing Compounds Other (See Comments)    Mouth/jaw pain  . Tramadol Itching, Swelling, Rash and Other (See Comments)    Rash and Throat swelling caused her to go to ER   . Hydrocodone-Acetaminophen Hives  . Other Rash and Other (See Comments)    Oxygen tubing- BREAKS OUT THE SKIN IN RED, RAISED PATCHES  . Percocet [Oxycodone-Acetaminophen] Hives  . Vicodin [Hydrocodone-Acetaminophen] Hives  . Potassium Other (See Comments)    Pain in the mouth/jaws  . Statins Nausea Only    Social History:   Social History   Socioeconomic History  . Marital status: Widowed    Spouse name: Not on file  . Number of children: Not on file  . Years of education: Not on file  . Highest education level: Not on file  Occupational History  . Not on file  Tobacco Use  . Smoking status: Former Smoker    Quit date: 11/12/1992    Years since quitting: 26.6  . Smokeless tobacco: Never Used  Substance and Sexual Activity  . Alcohol use: No  . Drug use: No  . Sexual activity: Not on file  Other Topics Concern  . Not on file  Social History Narrative  . Not on file   Social Determinants of Health   Financial Resource Strain:   . Difficulty of Paying Living Expenses:   Food Insecurity:   . Worried About Charity fundraiser in the Last Year:   . Arboriculturist in the Last  Year:   Transportation Needs:   .  Lack of Transportation (Medical):   Marland Kitchen Lack of Transportation (Non-Medical):   Physical Activity:   . Days of Exercise per Week:   . Minutes of Exercise per Session:   Stress:   . Feeling of Stress :   Social Connections:   . Frequency of Communication with Friends and Family:   . Frequency of Social Gatherings with Friends and Family:   . Attends Religious Services:   . Active Member of Clubs or Organizations:   . Attends Archivist Meetings:   Marland Kitchen Marital Status:   Intimate Partner Violence:   . Fear of Current or Ex-Partner:   . Emotionally Abused:   Marland Kitchen Physically Abused:   . Sexually Abused:     Family History:   Family History  Problem Relation Age of Onset  . Hypertension Mother   . Uterine cancer Mother   . Hypertension Father    Family Status:  Family Status  Relation Name Status  . Mother  (Not Specified)  . Father  (Not Specified)    ROS:  Please see the history of present illness.  All other ROS reviewed and negative.     Physical Exam/Data:   Vitals:   06/28/19 0400 06/28/19 0600 06/28/19 1219 06/28/19 1246  BP: 107/73 116/63  120/72  Pulse: 83 84 77 85  Resp: 19 20 15 19   Temp:    (!) 97.5 F (36.4 C)  TempSrc:    Oral  SpO2: 100% 100% 98% 100%  Weight:      Height:       No intake or output data in the 24 hours ending 06/28/19 1603  Last 3 Weights 06/27/2019 08/30/2017 08/29/2017  Weight (lbs) 148 lb 125 lb 1.6 oz 123 lb 14.4 oz  Weight (kg) 67.132 kg 56.745 kg 56.2 kg     Body mass index is 25.4 kg/m.   General:  Well nourished, well developed, female in no acute distress HEENT: normal Lymph: no adenopathy Neck: JVD -9 cm Endocrine:  No thryomegaly Vascular: No carotid bruits; 4/4 extremity pulses 2+  Cardiac:  normal S1, S2; RRR; no murmur Lungs: Decreased breath sounds bases bilaterally, some rales, no wheezing, rhonchi  Abd: soft, nontender, no hepatomegaly  Ext: no edema Musculoskeletal:   No deformities, BUE and BLE strength normal and equal Skin: warm and dry, multiple areas of ecchymosis on her arms and legs Neuro:  CNs 2-12 intact, no focal abnormalities noted Psych:  Normal affect   EKG:  The EKG was personally reviewed and demonstrates: Sinus rhythm, heart rate 97, left bundle branch block is old Telemetry:  Telemetry was personally reviewed and demonstrates: Sinus rhythm   CV studies:   ECHO: 06/28/2019 1. Severe global reduction in LV systolic function; severe LVE; grade 1  diastolic dysfunction; mild to moderate MR; moderate LAE.  2. Left ventricular ejection fraction, by estimation, is 20 to 25%. The  left ventricle has severely decreased function. The left ventricle  demonstrates global hypokinesis. The left ventricular internal cavity size  was severely dilated. Left ventricular  diastolic parameters are consistent with Grade I diastolic dysfunction  (impaired relaxation). Elevated left atrial pressure.  3. Right ventricular systolic function is normal. The right ventricular  size is normal.  4. Left atrial size was moderately dilated.  5. The mitral valve is normal in structure. Mild to moderate mitral valve  regurgitation. No evidence of mitral stenosis.  6. The aortic valve was not well visualized. Aortic valve regurgitation  is not visualized. No  aortic stenosis is present.  7. The inferior vena cava is normal in size with greater than 50%  respiratory variability, suggesting right atrial pressure of 3 mmHg.    ECHO:  Date: 08/31/2018 Start: 10:50 AM  Height: 64 inches Weight: 136 pounds BSA: 1.66 m2 BMI: 23.34 kg/m2  Rhythm: Ventricular paced rhythm HR: 79 bpm BP: 120/70 mmHg  Conclusions  Summary  No significant change vs previous echo  Mild mitral annular calcification.  Non-specific mitral leaflet thickening with preserved mobility.  No evidence of mitral stenosis.  Moderate mitral regurgitation. EROA: 0.33 cm squared.    Tricuspid valve is structurally normal.  No evidence of tricuspid stenosis.  Trace to mild tricuspid regurgitation.  RV systolic pressure: 33 mmHg.  Mildly dilated left ventricle.  No evidence of left ventricular mass or thrombus noted.  Normal left ventricular wall thickness.  The left ventricular systolic function is severely reduced.  Ejection fraction is visually estimated at 25-30%.  Diastolic dysfunction with evidence of increased LAP.  Diffuse hypokinesis of the left ventricle with basal to mid inferoseptal and  inferior wall akinesis.  Normal right ventricular size.  The right ventricular systolic function is mildly reduced. TAPSE: 1.34 cm. RV  S' Velocity: 9.14 cm/sec.  Pacer and/or defibrillator wires visualized in right ventricle.  Findings  Mitral Valve  Mild mitral annular calcification.  Non-specific mitral leaflet thickening with preserved mobility.  No evidence of mitral stenosis.  Moderate mitral regurgitation. EROA: 0.33 cm squared.  Aortic Valve  Trileaflet aortic valve with good leaflet mobility.  No evidence of aortic stenosis.  Trace aortic regurgitation.  Tricuspid Valve  Tricuspid valve is structurally normal.  No evidence of tricuspid stenosis.  Trace to mild tricuspid regurgitation.  RV systolic pressure: 33 mmHg.  Pulmonic Valve  The pulmonic valve was not well visualized.  No evidence of pulmonic stenosis.  Trace pulmonic regurgitation.  Left Atrium  Mildly dilated left atrium.  Left atrial volume index of 41 ml per meters squared BSA.  Left Ventricle  Mildly dilated left ventricle.  No evidence of left ventricular mass or thrombus noted.  Normal left ventricular wall thickness.  The left ventricular systolic function is severely reduced.  Ejection fraction is visually estimated at 25-30%.  Diastolic dysfunction with evidence of increased LAP.  Diffuse hypokinesis of the left ventricle with basal  to mid inferoseptal and  inferior wall akinesis.  Right Atrium  Normal size right atrium  Right Ventricle  Normal right ventricular size.  The right ventricular systolic function is mildly reduced. TAPSE: 1.34 cm. RV  S' Velocity: 9.14 cm/sec.  Pacer and/or defibrillator wires visualized in right ventricle.  Pericardial Effusion  No evidence of pericardial effusion.  Pleural Effusion  No pleural effusion seen.  Miscellaneous  The aortic root diameter is within normal limits.  The ascending aorta was not well visualized.  The IVC is normal.  M-Mode/2D Measurements & Calculations  LV Diastolic Dimension: LV Systolic Dimension:  LA Dimension: 4.3 cmAO  5.6 cm          4.8 cm          Root Dimension: 2.9 cmLA  LV FS:14.29 %      LV Volume Diastolic: 542 Area: 70.6 cm2  LV PW Diastolic: 0.9 cm ml  Septum Diastolic: 0.9 cm LV Volume Systolic: 237  CO: 6.28 l/min      ml  CI: 1.87 l/m*m2     LV EDV/LV EDV Index: 315 RV Diastolic Dimension:  ml/91 m2LV ESV/LV ESV  3.2 cm  LV Area Diastolic: 82.4 Index: 235 ml/70 m2  cm2           EF Calculated: 22.52 %  LA/Aorta: 3.61  LV Area Systolic: 44.3  EF Estimated: 22 %  cm2           LV Length: 9.33 cm    LA volume/Index: 68.5 ml                           /52m2              LVOT: 1.8 cm  Doppler Measurements & Calculations  MV Peak E-Wave: 117 cm/s AV Peak Velocity: 150 cm/s  LVOT Peak Velocity:  MV Peak A-Wave: 135 cm/s AV Peak Gradient: 9 mmHg   82.4 cm/s  MV E/A Ratio: 0.87    AV Mean Velocity: 97.8 cm/s  LVOT Mean Velocity:  MV Peak Gradient: 5.48  AV Mean Gradient: 4 mmHg   49 cm/s  mmHg           AV VTI: 29.9 cm        LVOT Peak Gradient: 3              AV Area (Continuity):1.32 cm2 mmHgLVOT Mean  MV Deceleration Time:                  Gradient: 1 mmHg  194 msec         LVOT VTI: 15.5 cm       Estimated RVSP: 33  MV P1/2t: 57 msec                   mmHg  MVA by PHT3.86 cm2                   Estimated RAP:5 mmHg  TDI E' Velocity: 3.37  cm/s  MR Velocity: 591 cm/s  MR VTI: 196 cm  E/E' prime 24  MV ROA Volumetric: 0.33  cm2   CATH: none in our system   Laboratory Data:   Chemistry Recent Labs  Lab 06/27/19 1734 06/28/19 0942  NA 137 140  K 3.7 5.1  CL 99 98  CO2 27 31  GLUCOSE 193* 159*  BUN 34* 35*  CREATININE 1.64* 1.68*  CALCIUM 8.9 9.0  GFRNONAA 29* 28*  GFRAA 33* 32*  ANIONGAP 11 11    Lab Results  Component Value Date   ALT 8 (L) 08/27/2017   AST 11 (L) 08/27/2017   ALKPHOS 98 08/27/2017   BILITOT 0.5 08/27/2017   Hematology Recent Labs  Lab 06/27/19 1734  WBC 9.4  RBC 3.63*  HGB 10.7*  HCT 35.0*  MCV 96.4  MCH 29.5  MCHC 30.6  RDW 14.8  PLT 291   Cardiac Enzymes High Sensitivity Troponin:   Recent Labs  Lab 06/27/19 1734 06/27/19 1922  TROPONINIHS 14 17      BNP Recent Labs  Lab 06/27/19 1734  BNP 446.8*    DDimer  Recent Labs  Lab 06/27/19 2323  DDIMER 1.55*   TSH: No results found for: TSH Lipids:No results found for: CHOL, HDL, LDLCALC, LDLDIRECT, TRIG, CHOLHDL HgbA1c: Lab Results  Component Value Date   HGBA1C 5.6 08/27/2017   Magnesium:  Magnesium  Date Value Ref Range Status  06/27/2019 2.3 1.7 - 2.4 mg/dL Final    Comment:    Performed at [address] [place],  [place] [address]    Radiology/Studies:  DG Chest 2 View  Result Date: 06/27/2019 CLINICAL DATA:  85 year old female with CHF exacerbation. EXAM: CHEST - 2 VIEW COMPARISON:  Chest radiograph dated 06/19/2019. FINDINGS: Small bilateral pleural effusions with minimal bibasilar atelectasis. Pneumonia is not excluded. Clinical correlation is recommended. There is no pneumothorax. There  is mild cardiomegaly. No vascular congestion or edema. Left pectoral AICD device. Median sternotomy wires. Osteopenia with degenerative changes of the spine. Age indeterminate, likely old, lower thoracic compression fractures as well as old compression fracture of L1 with vertebroplasty. Correlation with point tenderness recommended. IMPRESSION: 1. Small bilateral pleural effusions with minimal bibasilar atelectasis. Pneumonia is not excluded. 2. Mild cardiomegaly.  No congestion or edema. Electronically Signed   By: Anner Crete M.D.   On: 06/27/2019 18:16   NM Pulmonary Perf and Vent  Result Date: 06/28/2019 CLINICAL DATA:  Shortness of breath for 1.5 months, history COPD, emphysema, bronchitis EXAM: NUCLEAR MEDICINE VENTILATION - PERFUSION LUNG SCAN TECHNIQUE: Ventilation images were obtained in multiple projections using inhaled aerosol Tc-66m DTPA. Perfusion images were obtained in multiple projections after intravenous injection of Tc-67m MAA. RADIOPHARMACEUTICALS:  40.3 mCi of Tc-45m DTPA aerosol inhalation and 1.8 mCi Tc46m MAA IV COMPARISON:  None; correlation chest radiograph 06/27/2019 FINDINGS: Ventilation: Subsegmental ventilation defects in the RIGHT middle lobe, RIGHT lower lobe, LEFT lower lobe. Remaining ventilation normal Perfusion: Matching subsegmental perfusion defects in BILATERAL lower lobes and RIGHT middle lobe. No additional perfusion defects. Chest radiograph: Bibasilar pleural effusions and minimal bibasilar atelectasis IMPRESSION: Low probability for pulmonary embolism. Electronically Signed   By: Lavonia Dana M.D.   On: 06/28/2019 15:29   ECHOCARDIOGRAM COMPLETE  Result Date: 06/28/2019    ECHOCARDIOGRAM REPORT   Patient Name:   Brandi Crepeau Date of Exam: 06/28/2019 Medical Rec #:  [id number]     Height:       64.0 in Accession #:    [id number]    Weight:       148.0 lb Date of Birth:  1934-07-12    BSA:          1.721 m Patient Age:    85 years      BP:           116/63 mmHg  Patient Gender: F             HR:           89 bpm. Exam Location:  Inpatient Procedure: 2D Echo Indications:    acute systolic chf 287.86  History:        Patient has prior history of Echocardiogram examinations. CHF,                 Prior CABG and Defibrillator, COPD, Signs/Symptoms:Shortness of                 Breath and lower extremity edema; Risk Factors:Hypertension and                 Dyslipidemia.  Sonographer:    Johny Chess Referring Phys: [id number] [place]  1. Severe global reduction in LV systolic function; severe LVE; grade 1 diastolic dysfunction; mild to moderate MR; moderate LAE.  2. Left ventricular ejection fraction, by estimation, is 20 to 25%. The left ventricle has severely decreased function. The left ventricle demonstrates global hypokinesis. The left ventricular internal cavity size was severely dilated. Left ventricular diastolic parameters are consistent with Grade I diastolic dysfunction (impaired relaxation). Elevated left atrial pressure.  3. Right ventricular systolic function is normal. The right ventricular size is normal.  4. Left atrial size was moderately dilated.  5. The mitral valve is normal in structure. Mild to moderate mitral valve regurgitation. No evidence of mitral stenosis.  6. The aortic valve was not well visualized. Aortic valve regurgitation is not visualized. No aortic stenosis is present.  7. The inferior vena cava is normal in size with greater than 50% respiratory variability, suggesting right atrial pressure of 3 mmHg. FINDINGS  Left Ventricle: Left ventricular ejection fraction, by estimation, is 20 to 25%. The left ventricle has severely decreased function. The left ventricle demonstrates global hypokinesis. The left ventricular internal cavity size was severely dilated. There is no left ventricular hypertrophy. Left ventricular diastolic parameters are consistent with Grade I diastolic dysfunction (impaired relaxation). Elevated left  atrial pressure. Right Ventricle: The right ventricular size is normal. Right ventricular systolic function is normal. Left Atrium: Left atrial size was moderately dilated. Right Atrium: Right atrial size was normal in size. Pericardium: There is no evidence of pericardial effusion. Mitral Valve: The mitral valve is normal in structure. Normal mobility of the mitral valve leaflets. Mild mitral annular calcification. Mild to moderate mitral valve regurgitation. No evidence of mitral valve stenosis. Tricuspid Valve: The tricuspid valve is normal in structure. Tricuspid valve regurgitation is trivial. No evidence of tricuspid stenosis. Aortic Valve: The aortic valve was not well visualized. Aortic valve regurgitation is not visualized. No aortic stenosis is present. Pulmonic Valve: The pulmonic valve was not well visualized. Pulmonic valve regurgitation is not visualized. No evidence of pulmonic stenosis. Aorta: The aortic root is normal in size and structure. Venous: The inferior vena cava is normal in size with greater than 50% respiratory variability, suggesting right atrial pressure of 3 mmHg. IAS/Shunts: No atrial level shunt detected by color flow Doppler. Additional Comments: Severe global reduction in LV systolic function; severe LVE; grade 1 diastolic dysfunction; mild to moderate MR; moderate LAE.  LEFT VENTRICLE PLAX 2D LVIDd:         6.90 cm      Diastology LVIDs:         5.50 cm      LV e' lateral:   9.25 cm/s LV PW:         1.00 cm      LV E/e' lateral: 11.8 LV IVS:        1.00 cm      LV e' medial:    3.48 cm/s LVOT diam:     1.80 cm      LV E/e' medial:  31.3 LV SV:         63 LV SV Index:   37 LVOT Area:     2.54 cm  LV Volumes (MOD) LV vol d, MOD A2C: 133.0 ml LV vol s, MOD A2C: 103.0 ml LV SV MOD A2C:     30.0 ml RIGHT VENTRICLE RV S prime:     10.10 cm/s TAPSE (M-mode): 1.3 cm LEFT ATRIUM             Index       RIGHT ATRIUM           Index LA diam:        4.60 cm 2.67 cm/m  RA Area:     12.10 cm  LA Vol (A2C):   78.9 ml 45.84 ml/m RA Volume:   27.60 ml  16.03 ml/m LA Vol (A4C):   79.2 ml 46.01 ml/m LA Biplane Vol:  79.8 ml 46.36 ml/m  AORTIC VALVE LVOT Vmax:   136.00 cm/s LVOT Vmean:  89.800 cm/s LVOT VTI:    0.249 m  AORTA Ao Root diam: 2.70 cm MITRAL VALVE MV Area (PHT): 4.36 cm      SHUNTS MV Decel Time: 174 msec      Systemic VTI:  0.25 m MR Peak grad:    100.8 mmHg  Systemic Diam: 1.80 cm MR Mean grad:    66.0 mmHg MR Vmax:         502.00 cm/s MR Vmean:        383.0 cm/s MR PISA:         1.57 cm MR PISA Eff ROA: 13 mm MR PISA Radius:  0.50 cm MV E velocity: 109.00 cm/s MV A velocity: 130.00 cm/s MV E/A ratio:  0.84 Kirk Ruths MD Electronically signed by Kirk Ruths MD Signature Date/Time: 06/28/2019/12:44:33 PM    Final     Assessment and Plan:   1.  Acute on chronic systolic CHF: -Her weight is not much above her baseline. - Her RA pressure was 3 on her echo today, no PA pressures listed -Because of her baseline COPD, we need to keep her as dry as possible. -Continue IV Lasix for now, reassess in a.m. -Check BMET daily, follow daily weights and strict I/O - impedence information would be very helpful  2. VT -She has a history of this, even had a VF arrest in 2019, VT was seen at that time as well -Heart rate is in the 80s or better, but SBP is in the 100s at times so I am not sure she will tolerate a higher dose of carvedilol. -Could try changing her to Toprol-XL 12.5 mg twice daily and see if this helps. -Emphasized the importance of keeping device check appointments. -She does not do these at home, but has a machine. -Follow-up with outpatient EP as scheduled -MD advise if EP consult needed here  Otherwise, per IM Principal Problem:   Acute exacerbation of CHF (congestive heart failure) ([place]) Active Problems:   Chronic respiratory failure with hypoxia (HCC)   NSVT (nonsustained ventricular tachycardia) (HCC)   COPD (chronic obstructive pulmonary disease)  ([place])   AKI (acute kidney injury) ([place])     For questions or updates, please contact [facility] Please consult [email or web address] for contact info under Cardiology/STEMI.   Jonetta Speak, PA-C  06/28/2019 4:03 PM

## 2019-06-28 NOTE — Progress Notes (Signed)
  Echocardiogram 2D Echocardiogram has been performed.  Brandi Gardner 06/28/2019, 9:36 AM

## 2019-06-28 NOTE — Progress Notes (Signed)
PROGRESS NOTE    Brandi Gardner  WUJ:[id number] DOB: 04-03-1934 DOA: 06/27/2019 PCP: Cyndi Bender, PA-C   Brief Narrative:  Patient is 85 year old female with history of chronic systolic CHF status post AICD, coronary disease status post CABG, COPD, chronic respiratory failure on oxygen at 2 L/min at home, history of PE not on anticoagulation, hypertension, hyperlipidemia, anxiety who presents to the emerge department with complaints of shortness of breath, ICD dysfunction.  She reported progressive worsening of dyspnea on exertion, cough, bilateral lower extremity edema.  She reported drinking a lot of fluid at home.  On presentation ,she was hemodynamically stable.  Chest x-ray showed a small bilateral pleural effusion with minimal basilar atelectasis.  AICD was interrogated in the emergency department which showed multiple episodes of nonsustained V. tach, no shock delivered.  Started on IV diuresis for CHF exacerbation.  Assessment & Plan:   Principal Problem:   Acute exacerbation of CHF (congestive heart failure) (HCC) Active Problems:   Chronic respiratory failure with hypoxia (HCC)   NSVT (nonsustained ventricular tachycardia) (HCC)   COPD (chronic obstructive pulmonary disease) (HCC)   AKI (acute kidney injury) (HCC)   Dyspnea/acute exacerbation of chronic systolic CHF: Echocardiogram done here showed ejection fraction of 20 to 25%, severely decreased left ventricular function, global hypokinesis, grade 1 diastolic dysfunction.  Her previous echocardiogram had showed ejection fraction of 25 to 30%.  She is status post AICD.  Cardiology consulted today. She has been started on IV Lasix.  Continue input/output, daily weight. She reports drinking a lot of water at home.  She has been advised about the importance of fluid restriction and salt restriction at home. Elevated BNP.  Chronic hypoxic respiratory failure: Stable on 2 L of oxygen at home.  Continue supplemental  oxygen.  Nonsustained VT: AICD interrogation showing multiple episodes of NSVT.  No shock delivered.  Will monitor electrolytes.  COPD: Stable.  Not smoking at present.  Some wheezing on auscultation. continue home inhalers.  Continue bronchodilators as needed.  AKI on CKD stage IIIa: Likely from cardiorenal syndrome due to decompensated CHF.  Continue Lasix.  Continue to monitor kidney function.  Her creatinine ranges from 1-1.4.  History of PE not on anticoagulation: Mild elevated D-dimer.  If deficient started on heparin drip.  VQ scan is being done.  Will discontinue heparin drip if scan is negative for PE.  Debility/deconditioning: We will request a PT/OT evaluation           DVT prophylaxis:SubQ heparin Code Status: Full Family Communication: None present at bed side.  Patient states she is communicating with her family. Status is: Observation  The patient remains OBS appropriate and will d/c before 2 midnights.  Dispo: The patient is from: Home              Anticipated d/c is to: Home              Anticipated d/c date is: 1 day              Patient currently is not medically stable to d/c.        Consultants: cardiology  Procedures:None  Antimicrobials:  Anti-infectives (From admission, onward)   None      Subjective: Patient seen and examined at the bedside this morning.  Hemodynamically stable.  She was undergoing echocardiogram.  She denied any chest pain or shortness of breath at rest and states she was feeling better.  Objective: Vitals:   06/27/19 2245 06/28/19 0130 06/28/19 0400 06/28/19 0600  BP: 123/60  107/73 116/63  Pulse: 80 87 83 84  Resp: 18 (!) 22 19 20   Temp:      TempSrc:      SpO2: 100% 100% 100% 100%  Weight:      Height:       No intake or output data in the 24 hours ending 06/28/19 0901 Filed Weights   06/27/19 1642  Weight: 67.1 kg    Examination:  General exam: Pleasant elderly female Respiratory system: Some  scattered wheezes, no crackles Cardiovascular system: S1 & S2 heard, RRR. No JVD, murmurs, rubs, gallops or clicks. Trace  pedal edema. Gastrointestinal system: Abdomen is nondistended, soft and nontender. No organomegaly or masses felt. Normal bowel sounds heard. Central nervous system: Alert and oriented. No focal neurological deficits. Extremities: Trace bilateral lower extremity edema, no clubbing ,no cyanosis Skin: No rashes, lesions or ulcers,no icterus ,no pallor   Data Reviewed: I have personally reviewed following labs and imaging studies  CBC: Recent Labs  Lab 06/27/19 1734  WBC 9.4  NEUTROABS 7.7  HGB 10.7*  HCT 35.0*  MCV 96.4  PLT 242   Basic Metabolic Panel: Recent Labs  Lab 06/27/19 1734 06/27/19 2323  NA 137  --   K 3.7  --   CL 99  --   CO2 27  --   GLUCOSE 193*  --   BUN 34*  --   CREATININE 1.64*  --   CALCIUM 8.9  --   MG  --  2.3   GFR: Estimated Creatinine Clearance: 24.9 mL/min (A) (by C-G formula based on SCr of 1.64 mg/dL (H)). Liver Function Tests: No results for input(s): AST, ALT, ALKPHOS, BILITOT, PROT, ALBUMIN in the last 168 hours. No results for input(s): LIPASE, AMYLASE in the last 168 hours. No results for input(s): AMMONIA in the last 168 hours. Coagulation Profile: No results for input(s): INR, PROTIME in the last 168 hours. Cardiac Enzymes: No results for input(s): CKTOTAL, CKMB, CKMBINDEX, TROPONINI in the last 168 hours. BNP (last 3 results) No results for input(s): PROBNP in the last 8760 hours. HbA1C: No results for input(s): HGBA1C in the last 72 hours. CBG: No results for input(s): GLUCAP in the last 168 hours. Lipid Profile: No results for input(s): CHOL, HDL, LDLCALC, TRIG, CHOLHDL, LDLDIRECT in the last 72 hours. Thyroid Function Tests: No results for input(s): TSH, T4TOTAL, FREET4, T3FREE, THYROIDAB in the last 72 hours. Anemia Panel: No results for input(s): VITAMINB12, FOLATE, FERRITIN, TIBC, IRON, RETICCTPCT in  the last 72 hours. Sepsis Labs: Recent Labs  Lab 06/27/19 2323  PROCALCITON 0.19    Recent Results (from the past 240 hour(s))  SARS CORONAVIRUS 2 (TAT 6-24 HRS) Nasopharyngeal Nasopharyngeal Swab     Status: None   Collection Time: 06/27/19 10:47 PM   Specimen: Nasopharyngeal Swab  Result Value Ref Range Status   SARS Coronavirus 2 NEGATIVE NEGATIVE Final    Comment: (NOTE) SARS-CoV-2 target nucleic acids are NOT DETECTED. The SARS-CoV-2 RNA is generally detectable in upper and lower respiratory specimens during the acute phase of infection. Negative results do not preclude SARS-CoV-2 infection, do not rule out co-infections with other pathogens, and should not be used as the sole basis for treatment or other patient management decisions. Negative results must be combined with clinical observations, patient history, and epidemiological information. The expected result is Negative. Fact Sheet for Patients: SugarRoll.be Fact Sheet for Healthcare Providers: https://www.woods-mathews.com/ This test is not yet approved or cleared by the [place] FDA  and  has been authorized for detection and/or diagnosis of SARS-CoV-2 by FDA under an Emergency Use Authorization (EUA). This EUA will remain  in effect (meaning this test can be used) for the duration of the COVID-19 declaration under Section 56 4(b)(1) of the Act, 21 U.S.C. section 360bbb-3(b)(1), unless the authorization is terminated or revoked sooner. Performed at [facility], [place] [address], [place], [place] [address]          Radiology Studies: DG Chest 2 View  Result Date: 06/27/2019 CLINICAL DATA:  85 year old female with CHF exacerbation. EXAM: CHEST - 2 VIEW COMPARISON:  Chest radiograph dated 06/19/2019. FINDINGS: Small bilateral pleural effusions with minimal bibasilar atelectasis. Pneumonia is not excluded. Clinical correlation is recommended. There is no  pneumothorax. There is mild cardiomegaly. No vascular congestion or edema. Left pectoral AICD device. Median sternotomy wires. Osteopenia with degenerative changes of the spine. Age indeterminate, likely old, lower thoracic compression fractures as well as old compression fracture of L1 with vertebroplasty. Correlation with point tenderness recommended. IMPRESSION: 1. Small bilateral pleural effusions with minimal bibasilar atelectasis. Pneumonia is not excluded. 2. Mild cardiomegaly.  No congestion or edema. Electronically Signed   By: Anner Crete M.D.   On: 06/27/2019 18:16        Scheduled Meds: . acidophilus  1 capsule Oral Daily  . aspirin EC  81 mg Oral Daily  . carvedilol  3.125 mg Oral BID WC  . ferrous sulfate  324 mg Oral Q breakfast  . fluticasone  2 spray Each Nare Daily  . fluticasone furoate-vilanterol  1 puff Inhalation Daily  . pantoprazole  40 mg Oral QAC breakfast  . sodium chloride flush  3 mL Intravenous Q12H  . umeclidinium bromide  1 puff Inhalation Daily  . Vitamin D (Ergocalciferol)  50,000 Units Oral Q Tue   Continuous Infusions: . sodium chloride    . heparin 1,150 Units/hr (06/28/19 0202)     LOS: 0 days    Time spent: 35 mins.More than 50% of that time was spent in counseling and/or coordination of care.      Shelly Coss, MD [facility] P4/20/2021, 9:01 AM

## 2019-06-29 DIAGNOSIS — N179 Acute kidney failure, unspecified: Secondary | ICD-10-CM

## 2019-06-29 DIAGNOSIS — I472 Ventricular tachycardia: Secondary | ICD-10-CM

## 2019-06-29 DIAGNOSIS — I5043 Acute on chronic combined systolic (congestive) and diastolic (congestive) heart failure: Secondary | ICD-10-CM

## 2019-06-29 LAB — BASIC METABOLIC PANEL
Anion gap: 13 (ref 5–15)
BUN: 37 mg/dL — ABNORMAL HIGH (ref 8–23)
CO2: 29 mmol/L (ref 22–32)
Calcium: 9.1 mg/dL (ref 8.9–10.3)
Chloride: 99 mmol/L (ref 98–111)
Creatinine, Ser: 1.6 mg/dL — ABNORMAL HIGH (ref 0.44–1.00)
GFR calc Af Amer: 34 mL/min — ABNORMAL LOW (ref >60)
GFR calc non Af Amer: 30 mL/min — ABNORMAL LOW (ref >60)
Glucose, Bld: 111 mg/dL — ABNORMAL HIGH (ref 70–99)
Potassium: 4.2 mmol/L (ref 3.5–5.1)
Sodium: 141 mmol/L (ref 135–145)

## 2019-06-29 LAB — CBC
HCT: 34.4 % — ABNORMAL LOW (ref 36.0–46.0)
Hemoglobin: 10.6 g/dL — ABNORMAL LOW (ref 12.0–15.0)
MCH: 28.8 pg (ref 26.0–34.0)
MCHC: 30.8 g/dL (ref 30.0–36.0)
MCV: 93.5 fL (ref 80.0–100.0)
Platelets: 284 10*3/uL (ref 150–400)
RBC: 3.68 MIL/uL — ABNORMAL LOW (ref 3.87–5.11)
RDW: 14.8 % (ref 11.5–15.5)
WBC: 6.8 10*3/uL (ref 4.0–10.5)
nRBC: 0 % (ref 0.0–0.2)

## 2019-06-29 MED ORDER — MAGNESIUM HYDROXIDE 400 MG/5ML PO SUSP
15.0000 mL | Freq: Every day | ORAL | Status: DC | PRN
  Administered 2019-06-29 – 2019-06-30 (×2): 15 mL via ORAL
  Filled 2019-06-29 (×2): qty 30

## 2019-06-29 MED ORDER — TIZANIDINE HCL 4 MG PO TABS
4.0000 mg | ORAL_TABLET | Freq: Three times a day (TID) | ORAL | Status: DC | PRN
  Administered 2019-06-30: 4 mg via ORAL
  Filled 2019-06-29 (×2): qty 1

## 2019-06-29 MED ORDER — BUDESONIDE 0.5 MG/2ML IN SUSP
0.5000 mg | Freq: Two times a day (BID) | RESPIRATORY_TRACT | Status: DC
  Administered 2019-06-29 – 2019-07-01 (×5): 0.5 mg via RESPIRATORY_TRACT
  Filled 2019-06-29 (×5): qty 2

## 2019-06-29 NOTE — Progress Notes (Signed)
Progress Note    Brandi Gardner  GMW:[id number] DOB: Oct 12, 1934  DOA: 06/27/2019 PCP: Cyndi Bender, PA-C    Brief Narrative:     Medical records reviewed and are as summarized below:  Brandi Gardner is an 85 y.o. female with medical history significant of chronic systolic CHF status post AICD, CAD status post CABG, COPD, chronic respiratory failure on continuous 2 L home oxygen, history of PE currently not on anticoagulation, hypertension, hyperlipidemia, anxiety presenting with complaints of shortness of breath and ICD dysfunction.  Patient reports 1 month history of progressively worsening dyspnea on exertion, cough, and bilateral lower extremity edema.  Denies fevers or chills.  Reports taking Lasix 80 mg daily.  States she had a blood clot in her left lung back in either 1989 or 1990 and was on anticoagulation at that time.  Currently not on anticoagulation.  Assessment/Plan:   Principal Problem:   Acute exacerbation of CHF (congestive heart failure) (HCC) Active Problems:   Chronic respiratory failure with hypoxia (HCC)   NSVT (nonsustained ventricular tachycardia) (HCC)   COPD (chronic obstructive pulmonary disease) (HCC)   AKI (acute kidney injury) (HCC)   Dyspnea/acute exacerbation of chronic systolic CHF:  -Echocardiogram done here showed ejection fraction of 20 to 25%, severely decreased left ventricular function, global hypokinesis, grade 1 diastolic dysfunction.  Her previous echocardiogram had showed ejection fraction of 25 to 30%.  She is status post AICD. - Cardiology appreciated -Continue IV Lasix with close monitoring of renal function  Chronic hypoxic respiratory failure:  -Stable on 2 L of oxygen at home.  Continue supplemental oxygen.  Nonsustained VT: -AICD interrogation showing multiple episodes of NSVT.  No shock delivered.  Will monitor electrolytes.  COPD:  -Stable.  Not smoking at present.  Some wheezing on auscultation. continue home inhalers.   Continue bronchodilators as needed.  AKI on CKD stage IIIa:  -Likely from cardiorenal syndrome due to decompensated CHF.   -Continue Lasix per cardiology with close monitoring of kidney function.   -Her creatinine ranges from 1-1.4.  History of PE not on anticoagulation:  -VQ scan low risk for PE  Debility/deconditioning:  -PT/OT recommends home health     Family Communication/Anticipated D/C date and plan/Code Status   DVT prophylaxis: Heparin Code Status: Full Code.  Disposition Plan: Status is: Inpatient  Remains inpatient appropriate because:IV treatments appropriate due to intensity of illness or inability to take PO   Dispo: The patient is from: Home              Anticipated d/c is to: Home              Anticipated d/c date is: 1 day              Patient currently is not medically stable to d/c.          Medical Consultants:    Cardiology     Subjective:   Feels better today than yesterday but still not at her baseline she says  Objective:    Vitals:   06/28/19 2345 06/29/19 0525 06/29/19 0815 06/29/19 1147  BP:  131/65  112/65  Pulse:  87  86  Resp:  20  18  Temp:  98 F (36.7 C)  98 F (36.7 C)  TempSrc:  Oral  Oral  SpO2: 99% 100% 98% 98%  Weight:      Height:        Intake/Output Summary (Last 24 hours) at 06/29/2019 1418 Last data filed at  06/29/2019 0400 Gross per 24 hour  Intake 19 ml  Output 1000 ml  Net -981 ml   Filed Weights   06/27/19 1642  Weight: 67.1 kg    Exam: In bed, no acute distress Mild expiratory wheezing Positive bowel sounds, soft, nontender  Data Reviewed:   I have personally reviewed following labs and imaging studies:  Labs: Labs show the following:   Basic Metabolic Panel: Recent Labs  Lab 06/27/19 1734 06/27/19 1734 06/27/19 2323 06/28/19 0942 06/29/19 0505  NA 137  --   --  140 141  K 3.7   < >  --  5.1 4.2  CL 99  --   --  98 99  CO2 27  --   --  31 29  GLUCOSE 193*  --   --   159* 111*  BUN 34*  --   --  35* 37*  CREATININE 1.64*  --   --  1.68* 1.60*  CALCIUM 8.9  --   --  9.0 9.1  MG  --   --  2.3  --   --    < > = values in this interval not displayed.   GFR Estimated Creatinine Clearance: 25.5 mL/min (A) (by C-G formula based on SCr of 1.6 mg/dL (H)). Liver Function Tests: No results for input(s): AST, ALT, ALKPHOS, BILITOT, PROT, ALBUMIN in the last 168 hours. No results for input(s): LIPASE, AMYLASE in the last 168 hours. No results for input(s): AMMONIA in the last 168 hours. Coagulation profile No results for input(s): INR, PROTIME in the last 168 hours.  CBC: Recent Labs  Lab 06/27/19 1734 06/29/19 0505  WBC 9.4 6.8  NEUTROABS 7.7  --   HGB 10.7* 10.6*  HCT 35.0* 34.4*  MCV 96.4 93.5  PLT 291 284   Cardiac Enzymes: No results for input(s): CKTOTAL, CKMB, CKMBINDEX, TROPONINI in the last 168 hours. BNP (last 3 results) No results for input(s): PROBNP in the last 8760 hours. CBG: No results for input(s): GLUCAP in the last 168 hours. D-Dimer: Recent Labs    06/27/19 2323  DDIMER 1.55*   Hgb A1c: No results for input(s): HGBA1C in the last 72 hours. Lipid Profile: No results for input(s): CHOL, HDL, LDLCALC, TRIG, CHOLHDL, LDLDIRECT in the last 72 hours. Thyroid function studies: No results for input(s): TSH, T4TOTAL, T3FREE, THYROIDAB in the last 72 hours.  Invalid input(s): FREET3 Anemia work up: No results for input(s): VITAMINB12, FOLATE, FERRITIN, TIBC, IRON, RETICCTPCT in the last 72 hours. Sepsis Labs: Recent Labs  Lab 06/27/19 1734 06/27/19 2323 06/29/19 0505  PROCALCITON  --  0.19  --   WBC 9.4  --  6.8    Microbiology Recent Results (from the past 240 hour(s))  SARS CORONAVIRUS 2 (TAT 6-24 HRS) Nasopharyngeal Nasopharyngeal Swab     Status: None   Collection Time: 06/27/19 10:47 PM   Specimen: Nasopharyngeal Swab  Result Value Ref Range Status   SARS Coronavirus 2 NEGATIVE NEGATIVE Final    Comment:  (NOTE) SARS-CoV-2 target nucleic acids are NOT DETECTED. The SARS-CoV-2 RNA is generally detectable in upper and lower respiratory specimens during the acute phase of infection. Negative results do not preclude SARS-CoV-2 infection, do not rule out co-infections with other pathogens, and should not be used as the sole basis for treatment or other patient management decisions. Negative results must be combined with clinical observations, patient history, and epidemiological information. The expected result is Negative. Fact Sheet for Patients: SugarRoll.be Fact Sheet for  Healthcare Providers: https://www.woods-mathews.com/ This test is not yet approved or cleared by the [place] and  has been authorized for detection and/or diagnosis of SARS-CoV-2 by FDA under an Emergency Use Authorization (EUA). This EUA will remain  in effect (meaning this test can be used) for the duration of the COVID-19 declaration under Section 56 4(b)(1) of the Act, 21 U.S.C. section 360bbb-3(b)(1), unless the authorization is terminated or revoked sooner. Performed at [facility], [place] [address], [place], [place] [address]     Procedures and diagnostic studies:  DG Chest 2 View  Result Date: 06/27/2019 CLINICAL DATA:  85 year old female with CHF exacerbation. EXAM: CHEST - 2 VIEW COMPARISON:  Chest radiograph dated 06/19/2019. FINDINGS: Small bilateral pleural effusions with minimal bibasilar atelectasis. Pneumonia is not excluded. Clinical correlation is recommended. There is no pneumothorax. There is mild cardiomegaly. No vascular congestion or edema. Left pectoral AICD device. Median sternotomy wires. Osteopenia with degenerative changes of the spine. Age indeterminate, likely old, lower thoracic compression fractures as well as old compression fracture of L1 with vertebroplasty. Correlation with point tenderness recommended. IMPRESSION: 1. Small  bilateral pleural effusions with minimal bibasilar atelectasis. Pneumonia is not excluded. 2. Mild cardiomegaly.  No congestion or edema. Electronically Signed   By: Anner Crete M.D.   On: 06/27/2019 18:16   NM Pulmonary Perf and Vent  Result Date: 06/28/2019 CLINICAL DATA:  Shortness of breath for 1.5 months, history COPD, emphysema, bronchitis EXAM: NUCLEAR MEDICINE VENTILATION - PERFUSION LUNG SCAN TECHNIQUE: Ventilation images were obtained in multiple projections using inhaled aerosol Tc-62m DTPA. Perfusion images were obtained in multiple projections after intravenous injection of Tc-47m MAA. RADIOPHARMACEUTICALS:  40.3 mCi of Tc-13m DTPA aerosol inhalation and 1.8 mCi Tc23m MAA IV COMPARISON:  None; correlation chest radiograph 06/27/2019 FINDINGS: Ventilation: Subsegmental ventilation defects in the RIGHT middle lobe, RIGHT lower lobe, LEFT lower lobe. Remaining ventilation normal Perfusion: Matching subsegmental perfusion defects in BILATERAL lower lobes and RIGHT middle lobe. No additional perfusion defects. Chest radiograph: Bibasilar pleural effusions and minimal bibasilar atelectasis IMPRESSION: Low probability for pulmonary embolism. Electronically Signed   By: Lavonia Dana M.D.   On: 06/28/2019 15:29   ECHOCARDIOGRAM COMPLETE  Result Date: 06/28/2019    ECHOCARDIOGRAM REPORT   Patient Name:   Brandi Sipos Date of Exam: 06/28/2019 Medical Rec #:  [id number]     Height:       64.0 in Accession #:    [id number]    Weight:       148.0 lb Date of Birth:  [birth year]    BSA:          1.721 m Patient Age:    11 years      BP:           116/63 mmHg Patient Gender: F             HR:           89 bpm. Exam Location:  Inpatient Procedure: 2D Echo Indications:    acute systolic chf 660.63  History:        Patient has prior history of Echocardiogram examinations. CHF,                 Prior CABG and Defibrillator, COPD, Signs/Symptoms:Shortness of                 Breath and lower extremity edema; Risk  Factors:Hypertension and  Dyslipidemia.  Sonographer:    Johny Chess Referring Phys: [id number] [place]  1. Severe global reduction in LV systolic function; severe LVE; grade 1 diastolic dysfunction; mild to moderate MR; moderate LAE.  2. Left ventricular ejection fraction, by estimation, is 20 to 25%. The left ventricle has severely decreased function. The left ventricle demonstrates global hypokinesis. The left ventricular internal cavity size was severely dilated. Left ventricular diastolic parameters are consistent with Grade I diastolic dysfunction (impaired relaxation). Elevated left atrial pressure.  3. Right ventricular systolic function is normal. The right ventricular size is normal.  4. Left atrial size was moderately dilated.  5. The mitral valve is normal in structure. Mild to moderate mitral valve regurgitation. No evidence of mitral stenosis.  6. The aortic valve was not well visualized. Aortic valve regurgitation is not visualized. No aortic stenosis is present.  7. The inferior vena cava is normal in size with greater than 50% respiratory variability, suggesting right atrial pressure of 3 mmHg. FINDINGS  Left Ventricle: Left ventricular ejection fraction, by estimation, is 20 to 25%. The left ventricle has severely decreased function. The left ventricle demonstrates global hypokinesis. The left ventricular internal cavity size was severely dilated. There is no left ventricular hypertrophy. Left ventricular diastolic parameters are consistent with Grade I diastolic dysfunction (impaired relaxation). Elevated left atrial pressure. Right Ventricle: The right ventricular size is normal. Right ventricular systolic function is normal. Left Atrium: Left atrial size was moderately dilated. Right Atrium: Right atrial size was normal in size. Pericardium: There is no evidence of pericardial effusion. Mitral Valve: The mitral valve is normal in structure. Normal mobility  of the mitral valve leaflets. Mild mitral annular calcification. Mild to moderate mitral valve regurgitation. No evidence of mitral valve stenosis. Tricuspid Valve: The tricuspid valve is normal in structure. Tricuspid valve regurgitation is trivial. No evidence of tricuspid stenosis. Aortic Valve: The aortic valve was not well visualized. Aortic valve regurgitation is not visualized. No aortic stenosis is present. Pulmonic Valve: The pulmonic valve was not well visualized. Pulmonic valve regurgitation is not visualized. No evidence of pulmonic stenosis. Aorta: The aortic root is normal in size and structure. Venous: The inferior vena cava is normal in size with greater than 50% respiratory variability, suggesting right atrial pressure of 3 mmHg. IAS/Shunts: No atrial level shunt detected by color flow Doppler. Additional Comments: Severe global reduction in LV systolic function; severe LVE; grade 1 diastolic dysfunction; mild to moderate MR; moderate LAE.  LEFT VENTRICLE PLAX 2D LVIDd:         6.90 cm      Diastology LVIDs:         5.50 cm      LV e' lateral:   9.25 cm/s LV PW:         1.00 cm      LV E/e' lateral: 11.8 LV IVS:        1.00 cm      LV e' medial:    3.48 cm/s LVOT diam:     1.80 cm      LV E/e' medial:  31.3 LV SV:         63 LV SV Index:   37 LVOT Area:     2.54 cm  LV Volumes (MOD) LV vol d, MOD A2C: 133.0 ml LV vol s, MOD A2C: 103.0 ml LV SV MOD A2C:     30.0 ml RIGHT VENTRICLE RV S prime:     10.10 cm/s TAPSE (M-mode): 1.3 cm  LEFT ATRIUM             Index       RIGHT ATRIUM           Index LA diam:        4.60 cm 2.67 cm/m  RA Area:     12.10 cm LA Vol (A2C):   78.9 ml 45.84 ml/m RA Volume:   27.60 ml  16.03 ml/m LA Vol (A4C):   79.2 ml 46.01 ml/m LA Biplane Vol: 79.8 ml 46.36 ml/m  AORTIC VALVE LVOT Vmax:   136.00 cm/s LVOT Vmean:  89.800 cm/s LVOT VTI:    0.249 m  AORTA Ao Root diam: 2.70 cm MITRAL VALVE MV Area (PHT): 4.36 cm      SHUNTS MV Decel Time: 174 msec      Systemic VTI:  0.25  m MR Peak grad:    100.8 mmHg  Systemic Diam: 1.80 cm MR Mean grad:    66.0 mmHg MR Vmax:         502.00 cm/s MR Vmean:        383.0 cm/s MR PISA:         1.57 cm MR PISA Eff ROA: 13 mm MR PISA Radius:  0.50 cm MV E velocity: 109.00 cm/s MV A velocity: 130.00 cm/s MV E/A ratio:  0.84 Kirk Ruths MD Electronically signed by Kirk Ruths MD Signature Date/Time: 06/28/2019/12:44:33 PM    Final     Medications:   . acidophilus  1 capsule Oral Daily  . aspirin EC  81 mg Oral Daily  . budesonide  0.5 mg Nebulization BID  . carvedilol  3.125 mg Oral BID WC  . docusate sodium  100 mg Oral BID  . ferrous sulfate  324 mg Oral Q breakfast  . fluticasone  2 spray Each Nare Daily  . furosemide  40 mg Intravenous Q12H  . heparin injection (subcutaneous)  5,000 Units Subcutaneous Q8H  . pantoprazole  40 mg Oral QAC breakfast  . polyethylene glycol  17 g Oral Daily  . sodium chloride flush  3 mL Intravenous Q12H  . umeclidinium bromide  1 puff Inhalation Daily  . Vitamin D (Ergocalciferol)  50,000 Units Oral Q Tue   Continuous Infusions: . sodium chloride       LOS: 1 day   Geradine Girt  [facility]   How to contact the [facility] Attending or Consulting provider [place] or covering provider during after hours [place], for this patient?  1. Check the care team in [facility] and look for a) attending/consulting [facility] provider listed and b) the [facility] team listed 2. Log into [email or web address] and use [place]'s universal password to access. If you do not have the password, please contact the hospital operator. 3. Locate the [facility] provider you are looking for under [facility] and page to a number that you can be directly reached. 4. If you still have difficulty reaching the provider, please page the [facility] (Director on Call) for the Hospitalists listed on amion for assistance.  06/29/2019, 2:18 PM

## 2019-06-29 NOTE — Progress Notes (Addendum)
The patient has been seen in conjunction with Cecilie Kicks, NP. All aspects of care have been considered and discussed. The patient has been personally interviewed, examined, and all clinical data has been reviewed.   Breathing is somewhat better today.  She is 900 cc negative since admission. Kidney function is relatively stable but needs to be watched closely. Baseline creatinine is around 1.4.  We should give 1 or 2 additional doses of IV Lasix. Reassess kidney function in a.m.   Progress Note  [name] Date of Encounter: 06/29/2019  Primary Cardiologist: Mathis Bud, MD   Subjective   Just walked with PT, some SOB,  + wheezes.  She feels better than on admit  Inpatient Medications    Scheduled Meds: . acidophilus  1 capsule Oral Daily  . aspirin EC  81 mg Oral Daily  . budesonide  0.5 mg Nebulization BID  . carvedilol  3.125 mg Oral BID WC  . docusate sodium  100 mg Oral BID  . ferrous sulfate  324 mg Oral Q breakfast  . fluticasone  2 spray Each Nare Daily  . furosemide  40 mg Intravenous Q12H  . heparin injection (subcutaneous)  5,000 Units Subcutaneous Q8H  . pantoprazole  40 mg Oral QAC breakfast  . polyethylene glycol  17 g Oral Daily  . sodium chloride flush  3 mL Intravenous Q12H  . umeclidinium bromide  1 puff Inhalation Daily  . Vitamin D (Ergocalciferol)  50,000 Units Oral Q Tue   Continuous Infusions: . sodium chloride     PRN Meds: sodium chloride, ALPRAZolam, ipratropium-albuterol, sodium chloride flush   Vital Signs    Vitals:   06/28/19 2213 06/28/19 2345 06/29/19 0525 06/29/19 0815  BP: 112/69  131/65   Pulse: 84  87   Resp: 20  20   Temp: 98.2 F (36.8 C)  98 F (36.7 C)   TempSrc: Oral  Oral   SpO2: 100% 99% 100% 98%  Weight:      Height:        Intake/Output Summary (Last 24 hours) at 06/29/2019 0929 Last data filed at 06/29/2019 0400 Gross per 24 hour  Intake 19 ml  Output 1000 ml  Net -981 ml   Last 3  Weights 06/27/2019 08/30/2017 08/29/2017  Weight (lbs) 148 lb 125 lb 1.6 oz 123 lb 14.4 oz  Weight (kg) 67.132 kg 56.745 kg 56.2 kg      Telemetry    SR with PACs and PVCs LBBB - Personally Reviewed  ECG    No new - Personally Reviewed  Physical Exam   GEN: No acute distress.   Neck: No JVD sitting up Cardiac: RRR, no murmurs, rubs, or gallops.  Respiratory: diminished to auscultation bilaterally with insp exp wheezes. GI: Soft, nontender, non-distended  MS: No edema; No deformity. Neuro:  Nonfocal  Psych: Normal affect   Labs    High Sensitivity Troponin:   Recent Labs  Lab 06/27/19 1734 06/27/19 1922  TROPONINIHS 14 17      Chemistry Recent Labs  Lab 06/27/19 1734 06/28/19 0942 06/29/19 0505  NA 137 140 141  K 3.7 5.1 4.2  CL 99 98 99  CO2 27 31 29   GLUCOSE 193* 159* 111*  BUN 34* 35* 37*  CREATININE 1.64* 1.68* 1.60*  CALCIUM 8.9 9.0 9.1  GFRNONAA 29* 28* 30*  GFRAA 33* 32* 34*  ANIONGAP 11 11 13      Hematology Recent Labs  Lab 06/27/19 1734 06/29/19 0505  WBC  9.4 6.8  RBC 3.63* 3.68*  HGB 10.7* 10.6*  HCT 35.0* 34.4*  MCV 96.4 93.5  MCH 29.5 28.8  MCHC 30.6 30.8  RDW 14.8 14.8  PLT 291 284    BNP Recent Labs  Lab 06/27/19 1734  BNP 446.8*     DDimer  Recent Labs  Lab 06/27/19 2323  DDIMER 1.55*     Radiology    DG Chest 2 View  Result Date: 06/27/2019 CLINICAL DATA:  85 year old female with CHF exacerbation. EXAM: CHEST - 2 VIEW COMPARISON:  Chest radiograph dated 06/19/2019. FINDINGS: Small bilateral pleural effusions with minimal bibasilar atelectasis. Pneumonia is not excluded. Clinical correlation is recommended. There is no pneumothorax. There is mild cardiomegaly. No vascular congestion or edema. Left pectoral AICD device. Median sternotomy wires. Osteopenia with degenerative changes of the spine. Age indeterminate, likely old, lower thoracic compression fractures as well as old compression fracture of L1 with  vertebroplasty. Correlation with point tenderness recommended. IMPRESSION: 1. Small bilateral pleural effusions with minimal bibasilar atelectasis. Pneumonia is not excluded. 2. Mild cardiomegaly.  No congestion or edema. Electronically Signed   By: Anner Crete M.D.   On: 06/27/2019 18:16   NM Pulmonary Perf and Vent  Result Date: 06/28/2019 CLINICAL DATA:  Shortness of breath for 1.5 months, history COPD, emphysema, bronchitis EXAM: NUCLEAR MEDICINE VENTILATION - PERFUSION LUNG SCAN TECHNIQUE: Ventilation images were obtained in multiple projections using inhaled aerosol Tc-40m DTPA. Perfusion images were obtained in multiple projections after intravenous injection of Tc-49m MAA. RADIOPHARMACEUTICALS:  40.3 mCi of Tc-33m DTPA aerosol inhalation and 1.8 mCi Tc39m MAA IV COMPARISON:  None; correlation chest radiograph 06/27/2019 FINDINGS: Ventilation: Subsegmental ventilation defects in the RIGHT middle lobe, RIGHT lower lobe, LEFT lower lobe. Remaining ventilation normal Perfusion: Matching subsegmental perfusion defects in BILATERAL lower lobes and RIGHT middle lobe. No additional perfusion defects. Chest radiograph: Bibasilar pleural effusions and minimal bibasilar atelectasis IMPRESSION: Low probability for pulmonary embolism. Electronically Signed   By: Lavonia Dana M.D.   On: 06/28/2019 15:29   ECHOCARDIOGRAM COMPLETE  Result Date: 06/28/2019    ECHOCARDIOGRAM REPORT   Patient Name:   Brandi Visser Date of Exam: 06/28/2019 Medical Rec #:  [id number]     Height:       64.0 in Accession #:    [id number]    Weight:       148.0 lb Date of Birth:  [birth year]    BSA:          1.721 m Patient Age:    85 years      BP:           116/63 mmHg Patient Gender: F             HR:           89 bpm. Exam Location:  Inpatient Procedure: 2D Echo Indications:    acute systolic chf 381.82  History:        Patient has prior history of Echocardiogram examinations. CHF,                 Prior CABG and Defibrillator, COPD,  Signs/Symptoms:Shortness of                 Breath and lower extremity edema; Risk Factors:Hypertension and                 Dyslipidemia.  Sonographer:    Johny Chess Referring Phys: [id number] [place]  1. Severe global reduction in LV systolic function; severe  LVE; grade 1 diastolic dysfunction; mild to moderate MR; moderate LAE.  2. Left ventricular ejection fraction, by estimation, is 20 to 25%. The left ventricle has severely decreased function. The left ventricle demonstrates global hypokinesis. The left ventricular internal cavity size was severely dilated. Left ventricular diastolic parameters are consistent with Grade I diastolic dysfunction (impaired relaxation). Elevated left atrial pressure.  3. Right ventricular systolic function is normal. The right ventricular size is normal.  4. Left atrial size was moderately dilated.  5. The mitral valve is normal in structure. Mild to moderate mitral valve regurgitation. No evidence of mitral stenosis.  6. The aortic valve was not well visualized. Aortic valve regurgitation is not visualized. No aortic stenosis is present.  7. The inferior vena cava is normal in size with greater than 50% respiratory variability, suggesting right atrial pressure of 3 mmHg. FINDINGS  Left Ventricle: Left ventricular ejection fraction, by estimation, is 20 to 25%. The left ventricle has severely decreased function. The left ventricle demonstrates global hypokinesis. The left ventricular internal cavity size was severely dilated. There is no left ventricular hypertrophy. Left ventricular diastolic parameters are consistent with Grade I diastolic dysfunction (impaired relaxation). Elevated left atrial pressure. Right Ventricle: The right ventricular size is normal. Right ventricular systolic function is normal. Left Atrium: Left atrial size was moderately dilated. Right Atrium: Right atrial size was normal in size. Pericardium: There is no evidence of  pericardial effusion. Mitral Valve: The mitral valve is normal in structure. Normal mobility of the mitral valve leaflets. Mild mitral annular calcification. Mild to moderate mitral valve regurgitation. No evidence of mitral valve stenosis. Tricuspid Valve: The tricuspid valve is normal in structure. Tricuspid valve regurgitation is trivial. No evidence of tricuspid stenosis. Aortic Valve: The aortic valve was not well visualized. Aortic valve regurgitation is not visualized. No aortic stenosis is present. Pulmonic Valve: The pulmonic valve was not well visualized. Pulmonic valve regurgitation is not visualized. No evidence of pulmonic stenosis. Aorta: The aortic root is normal in size and structure. Venous: The inferior vena cava is normal in size with greater than 50% respiratory variability, suggesting right atrial pressure of 3 mmHg. IAS/Shunts: No atrial level shunt detected by color flow Doppler. Additional Comments: Severe global reduction in LV systolic function; severe LVE; grade 1 diastolic dysfunction; mild to moderate MR; moderate LAE.  LEFT VENTRICLE PLAX 2D LVIDd:         6.90 cm      Diastology LVIDs:         5.50 cm      LV e' lateral:   9.25 cm/s LV PW:         1.00 cm      LV E/e' lateral: 11.8 LV IVS:        1.00 cm      LV e' medial:    3.48 cm/s LVOT diam:     1.80 cm      LV E/e' medial:  31.3 LV SV:         63 LV SV Index:   37 LVOT Area:     2.54 cm  LV Volumes (MOD) LV vol d, MOD A2C: 133.0 ml LV vol s, MOD A2C: 103.0 ml LV SV MOD A2C:     30.0 ml RIGHT VENTRICLE RV S prime:     10.10 cm/s TAPSE (M-mode): 1.3 cm LEFT ATRIUM             Index       RIGHT ATRIUM  Index LA diam:        4.60 cm 2.67 cm/m  RA Area:     12.10 cm LA Vol (A2C):   78.9 ml 45.84 ml/m RA Volume:   27.60 ml  16.03 ml/m LA Vol (A4C):   79.2 ml 46.01 ml/m LA Biplane Vol: 79.8 ml 46.36 ml/m  AORTIC VALVE LVOT Vmax:   136.00 cm/s LVOT Vmean:  89.800 cm/s LVOT VTI:    0.249 m  AORTA Ao Root diam: 2.70 cm  MITRAL VALVE MV Area (PHT): 4.36 cm      SHUNTS MV Decel Time: 174 msec      Systemic VTI:  0.25 m MR Peak grad:    100.8 mmHg  Systemic Diam: 1.80 cm MR Mean grad:    66.0 mmHg MR Vmax:         502.00 cm/s MR Vmean:        383.0 cm/s MR PISA:         1.57 cm MR PISA Eff ROA: 13 mm MR PISA Radius:  0.50 cm MV E velocity: 109.00 cm/s MV A velocity: 130.00 cm/s MV E/A ratio:  0.84 Kirk Ruths MD Electronically signed by Kirk Ruths MD Signature Date/Time: 06/28/2019/12:44:33 PM    Final     Cardiac Studies   ECHO: 06/28/2019 1. Severe global reduction in LV systolic function; severe LVE; grade 1  diastolic dysfunction; mild to moderate MR; moderate LAE.  2. Left ventricular ejection fraction, by estimation, is 20 to 25%. The  left ventricle has severely decreased function. The left ventricle  demonstrates global hypokinesis. The left ventricular internal cavity size  was severely dilated. Left ventricular  diastolic parameters are consistent with Grade I diastolic dysfunction  (impaired relaxation). Elevated left atrial pressure.  3. Right ventricular systolic function is normal. The right ventricular  size is normal.  4. Left atrial size was moderately dilated.  5. The mitral valve is normal in structure. Mild to moderate mitral valve  regurgitation. No evidence of mitral stenosis.  6. The aortic valve was not well visualized. Aortic valve regurgitation  is not visualized. No aortic stenosis is present.  7. The inferior vena cava is normal in size with greater than 50%  respiratory variability, suggesting right atrial pressure of 3 mmHg.    ECHO:  Date: 08/31/2018 Start: 10:50 AM  Height: 64 inches Weight: 136 pounds BSA: 1.66 m2 BMI: 23.34 kg/m2  Rhythm: Ventricular paced rhythm HR: 79 bpm BP: 120/70 mmHg  Conclusions  Summary  No significant change vs previous echo  Mild mitral annular calcification.  Non-specific mitral leaflet thickening with preserved  mobility.  No evidence of mitral stenosis.  Moderate mitral regurgitation. EROA: 0.33 cm squared.  Tricuspid valve is structurally normal.  No evidence of tricuspid stenosis.  Trace to mild tricuspid regurgitation.  RV systolic pressure: 33 mmHg.  Mildly dilated left ventricle.  No evidence of left ventricular mass or thrombus noted.  Normal left ventricular wall thickness.  The left ventricular systolic function is severely reduced.  Ejection fraction is visually estimated at 25-30%.  Diastolic dysfunction with evidence of increased LAP.  Diffuse hypokinesis of the left ventricle with basal to mid inferoseptal and  inferior wall akinesis.  Normal right ventricular size.  The right ventricular systolic function is mildly reduced. TAPSE: 1.34 cm. RV  S' Velocity: 9.14 cm/sec.  Pacer and/or defibrillator wires visualized in right ventricle.  Findings  Mitral Valve  Mild mitral annular calcification.  Non-specific mitral leaflet thickening with preserved mobility.  No evidence of mitral stenosis.  Moderate mitral regurgitation. EROA: 0.33 cm squared.  Aortic Valve  Trileaflet aortic valve with good leaflet mobility.  No evidence of aortic stenosis.  Trace aortic regurgitation.  Tricuspid Valve  Tricuspid valve is structurally normal.  No evidence of tricuspid stenosis.  Trace to mild tricuspid regurgitation.  RV systolic pressure: 33 mmHg.  Pulmonic Valve  The pulmonic valve was not well visualized.  No evidence of pulmonic stenosis.  Trace pulmonic regurgitation.  Left Atrium  Mildly dilated left atrium.  Left atrial volume index of 41 ml per meters squared BSA.  Left Ventricle  Mildly dilated left ventricle.  No evidence of left ventricular mass or thrombus noted.  Normal left ventricular wall thickness.  The left ventricular systolic function is severely reduced.  Ejection fraction is visually estimated at 25-30%.    Diastolic dysfunction with evidence of increased LAP.  Diffuse hypokinesis of the left ventricle with basal to mid inferoseptal and  inferior wall akinesis.  Right Atrium  Normal size right atrium  Right Ventricle  Normal right ventricular size.  The right ventricular systolic function is mildly reduced. TAPSE: 1.34 cm. RV  S' Velocity: 9.14 cm/sec.  Pacer and/or defibrillator wires visualized in right ventricle.  Pericardial Effusion  No evidence of pericardial effusion.  Pleural Effusion  No pleural effusion seen.  Miscellaneous  The aortic root diameter is within normal limits.  The ascending aorta was not well visualized.   Patient Profile     85 y.o. female with a hx of S-CHF, EF 20% s/p MDT VISIA 04/2015, VT & VF arrest 07/24/2017 tx from [facility] to [facility] but elevated Cr so cath not done, CKD III, COPD on O2, CAD s/p CABG, HTN, HLD, PE  Assessment & Plan    1.  Acute on chronic systolic CHF:  BNP of 914 -Her weight is not much above her baseline. - Her RA pressure was 3 on her echo today, no PA pressures listed -Because of her baseline COPD, we need to keep her as dry as possible. -IV Lasix at, 40 mg BID, has rec'd 3 doses  She is neg 981 if correct no weights  -Check BMET daily, follow daily weights and strict I/O CR stable at 1.60 K+ 4.2  --resume imdur will defer to Dr. Tamala Julian  - impedence information would be very helpful  2. VT -She has a history of this, even had a VF arrest in 2019, VT was seen at that time as well with AICD MDT  -Heart rate is in the 80s or better, but SBP is in the 100s at times so I am not sure she will tolerate a higher dose of carvedilol. -Could try changing her to Toprol-XL 12.5 mg twice daily and see if this helps. -Emphasized the importance of keeping device check appointments. -She does not do these at home, but has a machine. -Follow-up with outpatient EP as scheduled -MD advise if EP consult  needed here  3.  CAD   With CABG in 1982 and 2007 neg troponin at 14 and 17,    4.  CKD 3b  5.  Anemia per IM  6.  Elevated D Dimer with neg chest CTA for PE.  7.  COPD per IM now with freq wheezes.   For questions or updates, please contact [place] Please consult [email or web address] for contact info under        Signed, Cecilie Kicks, NP  06/29/2019, 9:29 AM

## 2019-06-29 NOTE — Progress Notes (Signed)
Patient asked me why she was not getting a nebulized treatment of Albuterol at night and then her Pulmicort.  I told her that she only has Pulmicort ordered for tonight.  She was very upset, and feels like she is not being heard about her medications that she takes at home.  She stated that she has told several MDs about her meds not being right and nobody is listening.  I explained to patient that I would a note in for a doctor to explain why her meds may be a little different or why they are not ordering her meds like she does at home.

## 2019-06-29 NOTE — Progress Notes (Signed)
PT Progress Note for Charges    06/29/19 1100  PT Visit Information  Last PT Received On 06/29/19  PT General Charges  $$ ACUTE PT VISIT 1 Visit  PT Evaluation  $PT Eval Moderate Complexity 1 Mod  PT Treatments  $Gait Training 8-22 mins  Anastasio Champion, DPT  Acute Rehabilitation Services Pager [phone] Office [phone]

## 2019-06-29 NOTE — TOC Progression Note (Signed)
Transition of Care [facility]) - Progression Note    Patient Details  Name: Brandi Gardner MRN: [id number] Date of Birth: [birth year]  [facility] CM/SW Contact  Jacalyn Lefevre Edson Snowball, RN Phone Number: 06/29/2019, 11:57 AM  Clinical Narrative:     Patient from home alone. Has son in [place]. Both daughters passed away 26-Apr-2019, daughter in law passed away 10-24-2018. Patient has "son in law" who lives "very" close by and assists her ( [employer]) . Patient feels safe going home.  Patient has walker , shower chair and home oxygen already at home. Oxygen through [place] Patient . At discharge [employer] will bring portable oxygen tank and transport patient home.    Expected Discharge Plan: [place] Barriers to Discharge: Continued Medical Work up  Expected Discharge Plan and Services Expected Discharge Plan: [place]   Discharge Planning Services: CM Consult Post Acute Care Choice: [place] arrangements for the past 2 months: Apartment                 DME Arranged: N/A DME Agency: NA       HH Arranged: RN, PT, Disease Management [place] Agency: [place] Date [place]: 06/29/19 Time [place]: 1157 Representative spoke with at [place]: [place] ([place]) Interventions    Readmission Risk Interventions No flowsheet data found.

## 2019-06-29 NOTE — Evaluation (Signed)
Occupational Therapy Evaluation Patient Details Name: Brandi Gardner MRN: [id number] DOB: [birth year] Today's Date: 06/29/2019    History of Present Illness Brandi Laffey is a 85 y.o. female with medical history significant of chronic systolic CHF status post AICD, CAD status post CABG, COPD, chronic respiratory failure on continuous 2 L home O2, history of PE, HTN, hyperlipidemia, and anxiety presenting with SOB and ICD dysfunction from [place].  Imaging showing small Bil pleural effusions w/minimal basilar atelectasis w/hx of S-CHF, EF 20%   Clinical Impression   Pt PTA: pt living at home alone, SIL providing meals for pt as needed. Pt currently, modified independence for ADL tasks. Pt not wanting to place hands on RW after toilet hygiene so LOB epsiode x2, but able to right reactions in standing. Pt modified independent for LB ADL at EOB and standing at commode for pericare. Pt on 2L O2 >90% O2 with exertion. Pt remains tearful about loss of both daughters. Pt appears at her functional baseline. No further OT needs required at this time. OT signing off.      Follow Up Recommendations  No OT follow up;Supervision - Intermittent    Equipment Recommendations  None recommended by OT    Recommendations for Other Services       Precautions / Restrictions Precautions Precautions: Fall Precaution Comments: 2L O2 baseline Restrictions Weight Bearing Restrictions: No      Mobility Bed Mobility Overal bed mobility: Needs Assistance Bed Mobility: Supine to Sit     Supine to sit: Supervision     General bed mobility comments: use of rail  Transfers Overall transfer level: Needs assistance Equipment used: Rolling walker (2 wheeled) Transfers: Sit to/from Stand Sit to Stand: Min guard         General transfer comment: use of RW necessary for balance    Balance Overall balance assessment: Needs assistance Sitting-balance support: No upper extremity supported;Feet  supported Sitting balance-Leahy Scale: Fair     Standing balance support: Bilateral upper extremity supported;Single extremity supported;During functional activity Standing balance-Leahy Scale: Poor Standing balance comment: Pt requiring single UE for stability for grooming at sink                           ADL either performed or assessed with clinical judgement   ADL Overall ADL's : At baseline;Modified independent                                       General ADL Comments: Pt stood for grooming at sink with no diffculty; pt not wanting to place hands on RW after toilet hygiene so LOB epsiode x2, but able to right reactions. Pt modified independent for LB ADL at EOB and standing at commode for pericare.     Vision Baseline Vision/History: No visual deficits Patient Visual Report: No change from baseline Vision Assessment?: No apparent visual deficits     Perception     Praxis      Pertinent Vitals/Pain Pain Assessment: Faces Faces Pain Scale: No hurt     Hand Dominance Right   Extremity/Trunk Assessment Upper Extremity Assessment Upper Extremity Assessment: Overall WFL for tasks assessed   Lower Extremity Assessment Lower Extremity Assessment: Generalized weakness   Cervical / Trunk Assessment Cervical / Trunk Assessment: Kyphotic   Communication Communication Communication: No difficulties   Cognition Arousal/Alertness: Awake/alert Behavior During Therapy: WFL for tasks assessed/performed  Overall Cognitive Status: Within Functional Limits for tasks assessed                                 General Comments: A/O x4   General Comments  Edema in b/l feet. O2 >90% on 2L.    Exercises     Shoulder Instructions      Home Living Family/patient expects to be discharged to:: Private residence Living Arrangements: Alone Available Help at Discharge: Family;Available PRN/intermittently Type of Home: Apartment Home  Access: Ramped entrance     Home Layout: One level     Bathroom Shower/Tub: Tub/shower unit         Home Equipment: Environmental consultant - 2 wheels;Cane - single point;Shower seat          Prior Functioning/Environment Level of Independence: Independent;Needs assistance  Gait / Transfers Assistance Needed: Was not using AD PTA ADL's / Homemaking Assistance Needed: Daughters fiance assists with grocery shopping and prescription pickup   Comments: Daughter Larene Beach used to live with her but passed unexpectedly in Feb 2021.        OT Problem List: Decreased activity tolerance      OT Treatment/Interventions:      OT Goals(Current goals can be found in the care plan section) Acute Rehab OT Goals Patient Stated Goal: to calm down   OT Frequency:     Barriers to D/C:            Co-evaluation              AM-PAC OT "6 Clicks" Daily Activity     Outcome Measure Help from another person eating meals?: None Help from another person taking care of personal grooming?: None Help from another person toileting, which includes using toliet, bedpan, or urinal?: None Help from another person bathing (including washing, rinsing, drying)?: A Little Help from another person to put on and taking off regular upper body clothing?: None Help from another person to put on and taking off regular lower body clothing?: None 6 Click Score: 23   End of Session Equipment Utilized During Treatment: Rolling walker;Oxygen;Gait belt Nurse Communication: Mobility status  Activity Tolerance: Patient tolerated treatment well Patient left: Other (comment)(standing in room at sink PT)  OT Visit Diagnosis: Muscle weakness (generalized) (M62.81)                Time: 3382-5053 OT Time Calculation (min): 20 min Charges:  OT General Charges $OT Visit: 1 Visit OT Evaluation $OT Eval Moderate Complexity: 1 Mod  Jefferey Pica, OTR/L Acute Rehabilitation Services Pager: [phone] Office:  [phone]   Leanor Voris C 06/29/2019, 4:43 PM

## 2019-06-29 NOTE — Evaluation (Signed)
Physical Therapy Evaluation Patient Details Name: Brandi Gardner MRN: [id number] DOB: April 24, 1934 Today's Date: 06/29/2019   History of Present Illness  Brandi Gardner is a 85 y.o. female with medical history significant of chronic systolic CHF status post AICD, CAD status post CABG, COPD, chronic respiratory failure on continuous 2 L home O2, history of PE, HTN, hyperlipidemia, and anxiety presenting with SOB and ICD dysfunction from [place].  Imaging showing small Bil pleural effusions w/minimal basilar atelectasis w/hx of S-CHF, EF 20%    Clinical Impression  Patient accepted from OT session, finishing up bathroom care, and willing to participate in PT session. PTA, pt was ambulating without an AD and required assistance with grocery shopping and prescription pickup from daughters fiance. She lives alone in an apartment. She recently lost both of her daughters in Feb 2021 and was visibly upset when discussing home situation, she was grateful for someone to talk to about this. She was able to ambulate ~271ft with RW, min guard, and 2L O2 (baseline) with SpO2 >95% throughout. She complained of Bil foot pain and upon examination was found to have slight edema (R>L) and pain in R foot at metatarsals- educated on importance of ankle pumps and elevation. Pt is very motivated to improve functional mobility. Currently recommend HHPT and intermittent supervision for safety. Pt would continue to benefit from skilled physical therapy services at this time while admitted and after d/c to address the below listed limitations in order to improve overall safety and independence with functional mobility.      Follow Up Recommendations Home health PT;Supervision - Intermittent    Equipment Recommendations  Other (comment)(Pt has RW, cane, & shower chair at home)    Recommendations for Other Services       Precautions / Restrictions Precautions Precautions: Fall Precaution Comments: 2L O2  baseline Restrictions Weight Bearing Restrictions: No      Mobility  Bed Mobility Overal bed mobility: Needs Assistance Bed Mobility: Sit to Supine       Sit to supine: Supervision   General bed mobility comments: OOB upon arival. supervision for safety to go sit to supine w/minimal use of bedrail  Transfers Overall transfer level: Needs assistance Equipment used: Rolling walker (2 wheeled) Transfers: Sit to/from Stand Sit to Stand: Min guard         General transfer comment: OOB upon arrival. minguard to sit from standing for safety and VC for proper RW sequencign and safety  Ambulation/Gait Ambulation/Gait assistance: Min guard Gait Distance (Feet): 200 Feet Assistive device: Rolling walker (2 wheeled)(O2 tank) Gait Pattern/deviations: Step-through pattern;Decreased stride length Gait velocity: mildly dec   General Gait Details: Upon arrival, OT finishing session, pt attempting to wash hands without use of RW, visibly unstable with frequent swaying and near-LOB noted. Pt able to ambulate w/min guard for safety and 2L O2, SpO2 >95%. Pt visibly upset when discussing home situation as she lost both of her daughters in Feb 2021.   Stairs            Wheelchair Mobility    Modified Rankin (Stroke Patients Only)       Balance Overall balance assessment: Needs assistance Sitting-balance support: No upper extremity supported;Feet supported Sitting balance-Leahy Scale: Fair     Standing balance support: Bilateral upper extremity supported;During functional activity Standing balance-Leahy Scale: Poor Standing balance comment: Pt standing at sinkt o wash hands- requiring turnk or UE support to maintain balance. Swaying and near-LOB noted when pt attempts to stand w/o support  Pertinent Vitals/Pain Pain Assessment: Faces Faces Pain Scale: Hurts a little bit Pain Location: Bil Feet    Home Living Family/patient expects to  be discharged to:: Private residence Living Arrangements: Alone Available Help at Discharge: Family;Available PRN/intermittently Type of Home: Apartment Home Access: Ramped entrance     Home Layout: One level Home Equipment: Walker - 2 wheels;Cane - single point;Shower seat      Prior Function Level of Independence: Independent;Needs assistance   Gait / Transfers Assistance Needed: Was not using AD PTA  ADL's / Homemaking Assistance Needed: Daughters fiance assists with grocery shopping and prescription pickup  Comments: Daughter Brandi Gardner used to live with her but passed unexpectedly in Feb 2021.     Hand Dominance        Extremity/Trunk Assessment   Upper Extremity Assessment Upper Extremity Assessment: Overall WFL for tasks assessed;Defer to OT evaluation    Lower Extremity Assessment Lower Extremity Assessment: Generalized weakness;RLE deficits/detail;LLE deficits/detail RLE Deficits / Details: Pain noted at base of metatarsals, slight edema present.  RLE Sensation: (States sensation on dorsal mets is more intense vs. L foot) LLE Deficits / Details: No pinpoint pain noted. Slight edema present R>L LLE Sensation: WNL       Communication   Communication: No difficulties  Cognition Arousal/Alertness: Awake/alert Behavior During Therapy: WFL for tasks assessed/performed Overall Cognitive Status: Within Functional Limits for tasks assessed                                        General Comments General comments (skin integrity, edema, etc.): Slight edema noted in Bil feet, R>L    Exercises     Assessment/Plan    PT Assessment Patient needs continued PT services  PT Problem List Decreased strength;Decreased range of motion;Decreased activity tolerance;Decreased balance;Decreased mobility;Decreased coordination;Decreased cognition;Decreased knowledge of use of DME;Decreased safety awareness;Cardiopulmonary status limiting activity       PT  Treatment Interventions DME instruction;Gait training;Stair training;Functional mobility training;Therapeutic activities;Therapeutic exercise;Balance training;Neuromuscular re-education    PT Goals (Current goals can be found in the Care Plan section)  Acute Rehab PT Goals Patient Stated Goal: to calm down  PT Goal Formulation: With patient Time For Goal Achievement: 07/13/19 Potential to Achieve Goals: Good    Frequency Min 3X/week   Barriers to discharge Decreased caregiver support Family lives in [place], lost both daughters in Feb 2021. Daughters fiance helps with groceries and prescription pickup    Co-evaluation               AM-PAC PT "6 Clicks" Mobility  Outcome Measure Help needed turning from your back to your side while in a flat bed without using bedrails?: A Little Help needed moving from lying on your back to sitting on the side of a flat bed without using bedrails?: None Help needed moving to and from a bed to a chair (including a wheelchair)?: A Little Help needed standing up from a chair using your arms (e.g., wheelchair or bedside chair)?: A Little Help needed to walk in hospital room?: A Little Help needed climbing 3-5 steps with a railing? : A Little 6 Click Score: 19    End of Session Equipment Utilized During Treatment: Gait belt;Oxygen Activity Tolerance: Patient tolerated treatment well Patient left: in bed;with call bell/phone within reach;with bed alarm set Nurse Communication: Patient requests pain meds PT Visit Diagnosis: Unsteadiness on feet (R26.81);Other abnormalities of gait and mobility (R26.89);Muscle  weakness (generalized) (M62.81)    Time: 8786-7672 PT Time Calculation (min) (ACUTE ONLY): 23 min   Charges:   PT Evaluation $PT Eval Moderate Complexity: (P) 1 Mod PT Treatments $Gait Training: (P) 8-22 mins   Brandi Gardner, SPT Acute Rehab  [phone]     Brandi Gardner 06/29/2019, 11:02 AM

## 2019-06-30 DIAGNOSIS — J9611 Chronic respiratory failure with hypoxia: Secondary | ICD-10-CM

## 2019-06-30 LAB — BASIC METABOLIC PANEL
Anion gap: 11 (ref 5–15)
BUN: 42 mg/dL — ABNORMAL HIGH (ref 8–23)
CO2: 32 mmol/L (ref 22–32)
Calcium: 9.4 mg/dL (ref 8.9–10.3)
Chloride: 99 mmol/L (ref 98–111)
Creatinine, Ser: 1.72 mg/dL — ABNORMAL HIGH (ref 0.44–1.00)
GFR calc Af Amer: 32 mL/min — ABNORMAL LOW (ref >60)
GFR calc non Af Amer: 27 mL/min — ABNORMAL LOW (ref >60)
Glucose, Bld: 123 mg/dL — ABNORMAL HIGH (ref 70–99)
Potassium: 5 mmol/L (ref 3.5–5.1)
Sodium: 142 mmol/L (ref 135–145)

## 2019-06-30 MED ORDER — FUROSEMIDE 80 MG PO TABS
80.0000 mg | ORAL_TABLET | Freq: Every day | ORAL | Status: DC
  Administered 2019-07-01: 80 mg via ORAL
  Filled 2019-06-30: qty 1

## 2019-06-30 MED ORDER — ALBUTEROL SULFATE (2.5 MG/3ML) 0.083% IN NEBU: 2.5000 mg | INHALATION_SOLUTION | Freq: Four times a day (QID) | RESPIRATORY_TRACT | Status: DC | PRN

## 2019-06-30 MED ORDER — ALBUTEROL SULFATE (2.5 MG/3ML) 0.083% IN NEBU
2.5000 mg | INHALATION_SOLUTION | Freq: Two times a day (BID) | RESPIRATORY_TRACT | Status: DC
  Administered 2019-06-30 – 2019-07-01 (×3): 2.5 mg via RESPIRATORY_TRACT
  Filled 2019-06-30 (×3): qty 3

## 2019-06-30 NOTE — Progress Notes (Signed)
Progress Note  [name] Date of Encounter: 06/30/2019  Primary Cardiologist: Mathis Bud, MD   Subjective   She feels weak today.  Her breathing is better.  Inpatient Medications    Scheduled Meds: . acidophilus  1 capsule Oral Daily  . albuterol  2.5 mg Nebulization Q12H  . aspirin EC  81 mg Oral Daily  . budesonide  0.5 mg Nebulization BID  . carvedilol  3.125 mg Oral BID WC  . docusate sodium  100 mg Oral BID  . ferrous sulfate  324 mg Oral Q breakfast  . fluticasone  2 spray Each Nare Daily  . [START ON 07/01/2019] furosemide  80 mg Oral Daily  . heparin injection (subcutaneous)  5,000 Units Subcutaneous Q8H  . pantoprazole  40 mg Oral QAC breakfast  . polyethylene glycol  17 g Oral Daily  . sodium chloride flush  3 mL Intravenous Q12H  . umeclidinium bromide  1 puff Inhalation Daily  . Vitamin D (Ergocalciferol)  50,000 Units Oral Q Tue   Continuous Infusions: . sodium chloride     PRN Meds: sodium chloride, albuterol, ALPRAZolam, ipratropium-albuterol, magnesium hydroxide, sodium chloride flush, tiZANidine   Vital Signs    Vitals:   06/30/19 0703 06/30/19 0820 06/30/19 0821 06/30/19 0823  BP: 99/75     Pulse: 92     Resp: 17     Temp: 98.1 F (36.7 C)     TempSrc: Oral     SpO2: 97% 99% 99% 99%  Weight:      Height:        Intake/Output Summary (Last 24 hours) at 06/30/2019 0908 Last data filed at 06/30/2019 0830 Gross per 24 hour  Intake 340 ml  Output 800 ml  Net -460 ml   Last 3 Weights 06/27/2019 08/30/2017 08/29/2017  Weight (lbs) 148 lb 125 lb 1.6 oz 123 lb 14.4 oz  Weight (kg) 67.132 kg 56.745 kg 56.2 kg      Telemetry    Normal sinus rhythm- Personally Reviewed  ECG    No new tracing- Personally Reviewed  Physical Exam  Comfortable, lying in bed, meditating. GEN: No acute distress.   Neck: No JVD Cardiac: RRR, no murmurs, rubs, or gallops.  Respiratory: Clear to auscultation bilaterally. GI: Soft, nontender,  non-distended  MS: No edema; No deformity. Neuro:  Nonfocal  Psych: Normal affect   Labs    High Sensitivity Troponin:   Recent Labs  Lab 06/27/19 1734 06/27/19 1922  TROPONINIHS 14 17      Chemistry Recent Labs  Lab 06/28/19 0942 06/29/19 0505 06/30/19 0523  NA 140 141 142  K 5.1 4.2 5.0  CL 98 99 99  CO2 31 29 32  GLUCOSE 159* 111* 123*  BUN 35* 37* 42*  CREATININE 1.68* 1.60* 1.72*  CALCIUM 9.0 9.1 9.4  GFRNONAA 28* 30* 27*  GFRAA 32* 34* 32*  ANIONGAP 11 13 11      Hematology Recent Labs  Lab 06/27/19 1734 06/29/19 0505  WBC 9.4 6.8  RBC 3.63* 3.68*  HGB 10.7* 10.6*  HCT 35.0* 34.4*  MCV 96.4 93.5  MCH 29.5 28.8  MCHC 30.6 30.8  RDW 14.8 14.8  PLT 291 284    BNP Recent Labs  Lab 06/27/19 1734  BNP 446.8*     DDimer  Recent Labs  Lab 06/27/19 2323  DDIMER 1.55*     Radiology    NM Pulmonary Perf and Vent  Result Date: 06/28/2019 CLINICAL DATA:  Shortness of breath  for 1.5 months, history COPD, emphysema, bronchitis EXAM: NUCLEAR MEDICINE VENTILATION - PERFUSION LUNG SCAN TECHNIQUE: Ventilation images were obtained in multiple projections using inhaled aerosol Tc-27m DTPA. Perfusion images were obtained in multiple projections after intravenous injection of Tc-9m MAA. RADIOPHARMACEUTICALS:  40.3 mCi of Tc-61m DTPA aerosol inhalation and 1.8 mCi Tc46m MAA IV COMPARISON:  None; correlation chest radiograph 06/27/2019 FINDINGS: Ventilation: Subsegmental ventilation defects in the RIGHT middle lobe, RIGHT lower lobe, LEFT lower lobe. Remaining ventilation normal Perfusion: Matching subsegmental perfusion defects in BILATERAL lower lobes and RIGHT middle lobe. No additional perfusion defects. Chest radiograph: Bibasilar pleural effusions and minimal bibasilar atelectasis IMPRESSION: Low probability for pulmonary embolism. Electronically Signed   By: Lavonia Dana M.D.   On: 06/28/2019 15:29   ECHOCARDIOGRAM COMPLETE  Result Date: 06/28/2019     ECHOCARDIOGRAM REPORT   Patient Name:   Brandi Gardner Date of Exam: 06/28/2019 Medical Rec #:  [id number]     Height:       64.0 in Accession #:    [id number]    Weight:       148.0 lb Date of Birth:  09-21-1934    BSA:          1.721 m Patient Age:    85 years      BP:           116/63 mmHg Patient Gender: F             HR:           89 bpm. Exam Location:  Inpatient Procedure: 2D Echo Indications:    acute systolic chf 244.01  History:        Patient has prior history of Echocardiogram examinations. CHF,                 Prior CABG and Defibrillator, COPD, Signs/Symptoms:Shortness of                 Breath and lower extremity edema; Risk Factors:Hypertension and                 Dyslipidemia.  Sonographer:    Johny Chess Referring Phys: [id number] [place]  1. Severe global reduction in LV systolic function; severe LVE; grade 1 diastolic dysfunction; mild to moderate MR; moderate LAE.  2. Left ventricular ejection fraction, by estimation, is 20 to 25%. The left ventricle has severely decreased function. The left ventricle demonstrates global hypokinesis. The left ventricular internal cavity size was severely dilated. Left ventricular diastolic parameters are consistent with Grade I diastolic dysfunction (impaired relaxation). Elevated left atrial pressure.  3. Right ventricular systolic function is normal. The right ventricular size is normal.  4. Left atrial size was moderately dilated.  5. The mitral valve is normal in structure. Mild to moderate mitral valve regurgitation. No evidence of mitral stenosis.  6. The aortic valve was not well visualized. Aortic valve regurgitation is not visualized. No aortic stenosis is present.  7. The inferior vena cava is normal in size with greater than 50% respiratory variability, suggesting right atrial pressure of 3 mmHg. FINDINGS  Left Ventricle: Left ventricular ejection fraction, by estimation, is 20 to 25%. The left ventricle has severely decreased  function. The left ventricle demonstrates global hypokinesis. The left ventricular internal cavity size was severely dilated. There is no left ventricular hypertrophy. Left ventricular diastolic parameters are consistent with Grade I diastolic dysfunction (impaired relaxation). Elevated left atrial pressure. Right Ventricle: The right ventricular size is normal. Right ventricular systolic  function is normal. Left Atrium: Left atrial size was moderately dilated. Right Atrium: Right atrial size was normal in size. Pericardium: There is no evidence of pericardial effusion. Mitral Valve: The mitral valve is normal in structure. Normal mobility of the mitral valve leaflets. Mild mitral annular calcification. Mild to moderate mitral valve regurgitation. No evidence of mitral valve stenosis. Tricuspid Valve: The tricuspid valve is normal in structure. Tricuspid valve regurgitation is trivial. No evidence of tricuspid stenosis. Aortic Valve: The aortic valve was not well visualized. Aortic valve regurgitation is not visualized. No aortic stenosis is present. Pulmonic Valve: The pulmonic valve was not well visualized. Pulmonic valve regurgitation is not visualized. No evidence of pulmonic stenosis. Aorta: The aortic root is normal in size and structure. Venous: The inferior vena cava is normal in size with greater than 50% respiratory variability, suggesting right atrial pressure of 3 mmHg. IAS/Shunts: No atrial level shunt detected by color flow Doppler. Additional Comments: Severe global reduction in LV systolic function; severe LVE; grade 1 diastolic dysfunction; mild to moderate MR; moderate LAE.  LEFT VENTRICLE PLAX 2D LVIDd:         6.90 cm      Diastology LVIDs:         5.50 cm      LV e' lateral:   9.25 cm/s LV PW:         1.00 cm      LV E/e' lateral: 11.8 LV IVS:        1.00 cm      LV e' medial:    3.48 cm/s LVOT diam:     1.80 cm      LV E/e' medial:  31.3 LV SV:         63 LV SV Index:   37 LVOT Area:     2.54  cm  LV Volumes (MOD) LV vol d, MOD A2C: 133.0 ml LV vol s, MOD A2C: 103.0 ml LV SV MOD A2C:     30.0 ml RIGHT VENTRICLE RV S prime:     10.10 cm/s TAPSE (M-mode): 1.3 cm LEFT ATRIUM             Index       RIGHT ATRIUM           Index LA diam:        4.60 cm 2.67 cm/m  RA Area:     12.10 cm LA Vol (A2C):   78.9 ml 45.84 ml/m RA Volume:   27.60 ml  16.03 ml/m LA Vol (A4C):   79.2 ml 46.01 ml/m LA Biplane Vol: 79.8 ml 46.36 ml/m  AORTIC VALVE LVOT Vmax:   136.00 cm/s LVOT Vmean:  89.800 cm/s LVOT VTI:    0.249 m  AORTA Ao Root diam: 2.70 cm MITRAL VALVE MV Area (PHT): 4.36 cm      SHUNTS MV Decel Time: 174 msec      Systemic VTI:  0.25 m MR Peak grad:    100.8 mmHg  Systemic Diam: 1.80 cm MR Mean grad:    66.0 mmHg MR Vmax:         502.00 cm/s MR Vmean:        383.0 cm/s MR PISA:         1.57 cm MR PISA Eff ROA: 13 mm MR PISA Radius:  0.50 cm MV E velocity: 109.00 cm/s MV A velocity: 130.00 cm/s MV E/A ratio:  0.84 Kirk Ruths MD Electronically signed by Kirk Ruths MD Signature Date/Time: 06/28/2019/12:44:33  PM    Final     Cardiac Studies   No new data.  Patient Profile     85 y.o. female with a hx of S-CHF, EF 20% s/p MDT VISIA 04/2015, VT & VF arrest 07/24/2017 tx from [facility] to [facility] but elevated Cr so cath not done, CKD III, COPD on O2, CAD s/p CABG, HTN, HLD, PE  Assessment & Plan    1. Acute on chronic combined systolic and diastolic heart failure: Breathing is improved with diuresis.  She has approximately 1-1/2 L negative.  She is euvolemic to slightly volume contracted.  She feels weak this morning.  Discontinue IV Lasix.  Resume oral Lasix in a.m. 2. Acute on chronic kidney disease stage III: Slight bump in BUN and creatinine.  Have decided to hold today's diuretic and resume furosemide 80 mg once per day tomorrow a.m.  Basic metabolic panel in a.m.      For questions or updates, please contact [facility] Please consult [email or web address] for contact  info under        Signed, Sinclair Grooms, MD  06/30/2019, 9:08 AM

## 2019-06-30 NOTE — Progress Notes (Addendum)
Progress Note    Brandi Gardner  TZG:[id number] DOB: [birth year]  DOA: 06/27/2019 PCP: Cyndi Bender, PA-C    Brief Narrative:     Medical records reviewed and are as summarized below:  Brandi Gardner is an 85 y.o. female with medical history significant of chronic systolic CHF status post AICD, CAD status post CABG, COPD, chronic respiratory failure on continuous 2 L home oxygen, history of PE currently not on anticoagulation, hypertension, hyperlipidemia, anxiety presenting with complaints of shortness of breath and ICD dysfunction.  Patient reports 1 month history of progressively worsening dyspnea on exertion, cough, and bilateral lower extremity edema.  Denies fevers or chills.  Reports taking Lasix 80 mg daily.  States she had a blood clot in her left lung back in either 1989 or 1990 and was on anticoagulation at that time.  Currently not on anticoagulation.  Assessment/Plan:   Principal Problem:   Acute exacerbation of CHF (congestive heart failure) (HCC) Active Problems:   Chronic respiratory failure with hypoxia (HCC)   NSVT (nonsustained ventricular tachycardia) (HCC)   COPD (chronic obstructive pulmonary disease) (HCC)   AKI (acute kidney injury) (HCC)   Dyspnea/acute exacerbation of chronic systolic CHF:  -Echocardiogram done here showed ejection fraction of 20 to 25%, severely decreased left ventricular function, global hypokinesis, grade 1 diastolic dysfunction.  Her previous echocardiogram had showed ejection fraction of 25 to 30%.  She is status post AICD. - Cardiology appreciated -Lasix stopped and plan is to check BMP in the a.m. and resume home Lasix in the a.m.  Chronic hypoxic respiratory failure:  -Stable on 2 L of oxygen at home.  Continue supplemental oxygen.  Nonsustained VT: -AICD interrogation showing multiple episodes of NSVT.  No shock delivered.  Will monitor electrolytes.  COPD:  -Stable.  Not smoking at present.  Some wheezing on auscultation.  continue home inhalers.  Continue bronchodilators as needed.  AKI on CKD stage IIIb:  -Likely from cardiorenal syndrome due to decompensated CHF.   -Continue Lasix per cardiology with close monitoring of kidney function.   -Her creatinine ranges from 1-1.4.  History of PE not on anticoagulation:  -VQ scan low risk for PE  Debility/deconditioning:  -PT/OT recommends home health     Family Communication/Anticipated D/C date and plan/Code Status   DVT prophylaxis: Heparin Code Status: Full Code.  Disposition Plan: Status is: Inpatient  Remains inpatient appropriate because:IV treatments appropriate due to intensity of illness or inability to take PO   Dispo: The patient is from: Home              Anticipated d/c is to: Home              Anticipated d/c date is: 1 day              Patient currently is not medically stable to d/c.  If BMP stable in the a.m., suspect she can go home.  Have asked nursing staff to get her out of bed to a chair and to ambulate every shift          Medical Consultants:    Cardiology     Subjective:   Complaining of being tired  Objective:    Vitals:   06/30/19 0703 06/30/19 0820 06/30/19 0821 06/30/19 0823  BP: 99/75     Pulse: 92     Resp: 17     Temp: 98.1 F (36.7 C)     TempSrc: Oral     SpO2: 97% 99% 99%  99%  Weight:      Height:        Intake/Output Summary (Last 24 hours) at 06/30/2019 1415 Last data filed at 06/30/2019 1224 Gross per 24 hour  Intake 480 ml  Output 800 ml  Net -320 ml   Filed Weights   06/27/19 1642  Weight: 67.1 kg    Exam: In bed, no acute distress Wheezing cleared with coughing Able to carry on a conversation comfortably Positive bowel sounds, soft, nontender Data Reviewed:   I have personally reviewed following labs and imaging studies:  Labs: Labs show the following:   Basic Metabolic Panel: Recent Labs  Lab 06/27/19 1734 06/27/19 1734 06/27/19 2323 06/28/19 0942  06/28/19 0942 06/29/19 0505 06/30/19 0523  NA 137  --   --  140  --  141 142  K 3.7   < >  --  5.1   < > 4.2 5.0  CL 99  --   --  98  --  99 99  CO2 27  --   --  31  --  29 32  GLUCOSE 193*  --   --  159*  --  111* 123*  BUN 34*  --   --  35*  --  37* 42*  CREATININE 1.64*  --   --  1.68*  --  1.60* 1.72*  CALCIUM 8.9  --   --  9.0  --  9.1 9.4  MG  --   --  2.3  --   --   --   --    < > = values in this interval not displayed.   GFR Estimated Creatinine Clearance: 23.8 mL/min (A) (by C-G formula based on SCr of 1.72 mg/dL (H)). Liver Function Tests: No results for input(s): AST, ALT, ALKPHOS, BILITOT, PROT, ALBUMIN in the last 168 hours. No results for input(s): LIPASE, AMYLASE in the last 168 hours. No results for input(s): AMMONIA in the last 168 hours. Coagulation profile No results for input(s): INR, PROTIME in the last 168 hours.  CBC: Recent Labs  Lab 06/27/19 1734 06/29/19 0505  WBC 9.4 6.8  NEUTROABS 7.7  --   HGB 10.7* 10.6*  HCT 35.0* 34.4*  MCV 96.4 93.5  PLT 291 284   Cardiac Enzymes: No results for input(s): CKTOTAL, CKMB, CKMBINDEX, TROPONINI in the last 168 hours. BNP (last 3 results) No results for input(s): PROBNP in the last 8760 hours. CBG: No results for input(s): GLUCAP in the last 168 hours. D-Dimer: Recent Labs    06/27/19 2323  DDIMER 1.55*   Hgb A1c: No results for input(s): HGBA1C in the last 72 hours. Lipid Profile: No results for input(s): CHOL, HDL, LDLCALC, TRIG, CHOLHDL, LDLDIRECT in the last 72 hours. Thyroid function studies: No results for input(s): TSH, T4TOTAL, T3FREE, THYROIDAB in the last 72 hours.  Invalid input(s): FREET3 Anemia work up: No results for input(s): VITAMINB12, FOLATE, FERRITIN, TIBC, IRON, RETICCTPCT in the last 72 hours. Sepsis Labs: Recent Labs  Lab 06/27/19 1734 06/27/19 2323 06/29/19 0505  PROCALCITON  --  0.19  --   WBC 9.4  --  6.8    Microbiology Recent Results (from the past 240  hour(s))  SARS CORONAVIRUS 2 (TAT 6-24 HRS) Nasopharyngeal Nasopharyngeal Swab     Status: None   Collection Time: 06/27/19 10:47 PM   Specimen: Nasopharyngeal Swab  Result Value Ref Range Status   SARS Coronavirus 2 NEGATIVE NEGATIVE Final    Comment: (NOTE) SARS-CoV-2 target nucleic acids  are NOT DETECTED. The SARS-CoV-2 RNA is generally detectable in upper and lower respiratory specimens during the acute phase of infection. Negative results do not preclude SARS-CoV-2 infection, do not rule out co-infections with other pathogens, and should not be used as the sole basis for treatment or other patient management decisions. Negative results must be combined with clinical observations, patient history, and epidemiological information. The expected result is Negative. Fact Sheet for Patients: SugarRoll.be Fact Sheet for Healthcare Providers: https://www.woods-mathews.com/ This test is not yet approved or cleared by the [place] FDA and  has been authorized for detection and/or diagnosis of SARS-CoV-2 by FDA under an Emergency Use Authorization (EUA). This EUA will remain  in effect (meaning this test can be used) for the duration of the COVID-19 declaration under Section 56 4(b)(1) of the Act, 21 U.S.C. section 360bbb-3(b)(1), unless the authorization is terminated or revoked sooner. Performed at [facility], [place] [address], [place], [place] [address]     Procedures and diagnostic studies:  NM Pulmonary Perf and Vent  Result Date: 06/28/2019 CLINICAL DATA:  Shortness of breath for 1.5 months, history COPD, emphysema, bronchitis EXAM: NUCLEAR MEDICINE VENTILATION - PERFUSION LUNG SCAN TECHNIQUE: Ventilation images were obtained in multiple projections using inhaled aerosol Tc-32m DTPA. Perfusion images were obtained in multiple projections after intravenous injection of Tc-38m MAA. RADIOPHARMACEUTICALS:  40.3 mCi of Tc-56m DTPA  aerosol inhalation and 1.8 mCi Tc40m MAA IV COMPARISON:  None; correlation chest radiograph 06/27/2019 FINDINGS: Ventilation: Subsegmental ventilation defects in the RIGHT middle lobe, RIGHT lower lobe, LEFT lower lobe. Remaining ventilation normal Perfusion: Matching subsegmental perfusion defects in BILATERAL lower lobes and RIGHT middle lobe. No additional perfusion defects. Chest radiograph: Bibasilar pleural effusions and minimal bibasilar atelectasis IMPRESSION: Low probability for pulmonary embolism. Electronically Signed   By: Lavonia Dana M.D.   On: 06/28/2019 15:29    Medications:   . acidophilus  1 capsule Oral Daily  . albuterol  2.5 mg Nebulization Q12H  . aspirin EC  81 mg Oral Daily  . budesonide  0.5 mg Nebulization BID  . carvedilol  3.125 mg Oral BID WC  . docusate sodium  100 mg Oral BID  . ferrous sulfate  324 mg Oral Q breakfast  . fluticasone  2 spray Each Nare Daily  . [START ON 07/01/2019] furosemide  80 mg Oral Daily  . heparin injection (subcutaneous)  5,000 Units Subcutaneous Q8H  . pantoprazole  40 mg Oral QAC breakfast  . polyethylene glycol  17 g Oral Daily  . sodium chloride flush  3 mL Intravenous Q12H  . umeclidinium bromide  1 puff Inhalation Daily  . Vitamin D (Ergocalciferol)  50,000 Units Oral Q Tue   Continuous Infusions: . sodium chloride       LOS: 2 days   Geradine Girt  [facility]   How to contact the [facility] Attending or Consulting provider [place] or covering provider during after hours [place], for this patient?  1. Check the care team in [facility] and look for a) attending/consulting [facility] provider listed and b) the [facility] team listed 2. Log into [email or web address] and use [place]'s universal password to access. If you do not have the password, please contact the hospital operator. 3. Locate the [facility] provider you are looking for under [facility] and page to a number that you can be directly reached. 4. If you still have difficulty reaching  the provider, please page the [facility] (Director on Call) for the Hospitalists  listed on amion for assistance.  06/30/2019, 2:15 PM

## 2019-06-30 NOTE — Progress Notes (Signed)
Physical Therapy Treatment Patient Details Name: Brandi Gardner MRN: [id number] DOB: 06/03/1934 Today's Date: 06/30/2019    History of Present Illness Brandi Cortese is a 85 y.o. female with medical history significant of chronic systolic CHF status post AICD, CAD status post CABG, COPD, chronic respiratory failure on continuous 2 L home O2, history of PE, HTN, hyperlipidemia, and anxiety presenting with SOB and ICD dysfunction from [place].  Imaging showing small Bil pleural effusions w/minimal basilar atelectasis w/hx of S-CHF, EF 20%    PT Comments    Pt making good progress.  Demonstrates gait and transfers with stable vitals and min guard for safety.  Needs min cues for safety .  Cont POC.    Follow Up Recommendations  Home health PT;Supervision - Intermittent     Equipment Recommendations  Other (comment)(has DME)    Recommendations for Other Services       Precautions / Restrictions Precautions Precautions: Fall Precaution Comments: 2L O2 baseline    Mobility  Bed Mobility Overal bed mobility: Needs Assistance Bed Mobility: Supine to Sit     Supine to sit: Supervision     General bed mobility comments: use of rail  Transfers Overall transfer level: Needs assistance Equipment used: Rolling walker (2 wheeled) Transfers: Sit to/from Stand Sit to Stand: Min guard         General transfer comment: cues for safe hand placement; sit to stand x 3  Ambulation/Gait Ambulation/Gait assistance: Min guard Gait Distance (Feet): 200 Feet Assistive device: Rolling walker (2 wheeled) Gait Pattern/deviations: Step-through pattern;Decreased stride length     General Gait Details: cues for safe RW proximity and posture; tolerated well with DOE of 2/4   Stairs             Wheelchair Mobility    Modified Rankin (Stroke Patients Only)       Balance Overall balance assessment: Needs assistance Sitting-balance support: No upper extremity supported;Feet  supported Sitting balance-Leahy Scale: Good     Standing balance support: Single extremity supported;During functional activity Standing balance-Leahy Scale: Fair                              Cognition Arousal/Alertness: Awake/alert Behavior During Therapy: WFL for tasks assessed/performed Overall Cognitive Status: Within Functional Limits for tasks assessed                                        Exercises General Exercises - Lower Extremity Ankle Circles/Pumps: AROM;Both;10 reps;Seated Long Arc Quad: AROM;Both;10 reps;Seated Hip Flexion/Marching: AROM;Both;10 reps;Standing(with RW) Mini-Sqauts: AROM;Both;10 reps;Standing(with RW)    General Comments General comments (skin integrity, edema, etc.): Checked orthostatic BP and stable; On 2 LPM O2 with sats 100% throughout session.  Pt c/o R great toe pain that has been present for 2-3 days.  DId not note significant edema or erythema.  REports pain constant but did increase some with movement (flex and ext, PROM and AROM)      Pertinent Vitals/Pain Pain Assessment: Faces Faces Pain Scale: Hurts a little bit Pain Location: R great toe Pain Descriptors / Indicators: Constant;Discomfort;Sore Pain Intervention(s): Limited activity within patient's tolerance;Monitored during session    Home Living                      Prior Function  PT Goals (current goals can now be found in the care plan section) Progress towards PT goals: Progressing toward goals    Frequency    Min 3X/week      PT Plan Current plan remains appropriate    Co-evaluation              AM-PAC PT "6 Clicks" Mobility   Outcome Measure  Help needed turning from your back to your side while in a flat bed without using bedrails?: None Help needed moving from lying on your back to sitting on the side of a flat bed without using bedrails?: A Little Help needed moving to and from a bed to a chair  (including a wheelchair)?: None Help needed standing up from a chair using your arms (e.g., wheelchair or bedside chair)?: None Help needed to walk in hospital room?: None Help needed climbing 3-5 steps with a railing? : A Little 6 Click Score: 22    End of Session Equipment Utilized During Treatment: Gait belt;Oxygen Activity Tolerance: Patient tolerated treatment well Patient left: with call bell/phone within reach;in chair Nurse Communication: Mobility status PT Visit Diagnosis: Unsteadiness on feet (R26.81);Other abnormalities of gait and mobility (R26.89);Muscle weakness (generalized) (M62.81)     Time: 1430-1500 PT Time Calculation (min) (ACUTE ONLY): 30 min  Charges:  $Gait Training: 8-22 mins $Therapeutic Exercise: 8-22 mins                     Maggie Font, PT Acute Rehab Services Pager [phone] [facility] [phone] [facility] [phone]    Karlton Lemon 06/30/2019, 3:10 PM

## 2019-07-01 LAB — BASIC METABOLIC PANEL
Anion gap: 14 (ref 5–15)
BUN: 44 mg/dL — ABNORMAL HIGH (ref 8–23)
CO2: 28 mmol/L (ref 22–32)
Calcium: 9.3 mg/dL (ref 8.9–10.3)
Chloride: 96 mmol/L — ABNORMAL LOW (ref 98–111)
Creatinine, Ser: 1.63 mg/dL — ABNORMAL HIGH (ref 0.44–1.00)
GFR calc Af Amer: 34 mL/min — ABNORMAL LOW (ref >60)
GFR calc non Af Amer: 29 mL/min — ABNORMAL LOW (ref >60)
Glucose, Bld: 106 mg/dL — ABNORMAL HIGH (ref 70–99)
Potassium: 4.6 mmol/L (ref 3.5–5.1)
Sodium: 138 mmol/L (ref 135–145)

## 2019-07-01 MED ORDER — FUROSEMIDE 40 MG PO TABS: 80.0000 mg | ORAL_TABLET | Freq: Every morning | ORAL | Status: AC

## 2019-07-01 MED ORDER — METHYLPREDNISOLONE SODIUM SUCC 125 MG IJ SOLR
125.0000 mg | Freq: Once | INTRAMUSCULAR | Status: AC
  Administered 2019-07-01: 125 mg via INTRAVENOUS
  Filled 2019-07-01: qty 2

## 2019-07-01 NOTE — Progress Notes (Signed)
Physical Therapy Treatment Patient Details Name: Brandi Gardner MRN: [id number] DOB: [birth year] Today's Date: 07/01/2019    History of Present Illness Brandi Gerwig is a 85 y.o. female with medical history significant of chronic systolic CHF status post AICD, CAD status post CABG, COPD, chronic respiratory failure on continuous 2 L home O2, history of PE, HTN, hyperlipidemia, and anxiety presenting with SOB and ICD dysfunction from [place].  Imaging showing small Bil pleural effusions w/minimal basilar atelectasis w/hx of S-CHF, EF 20%    PT Comments    Pt making good progress.  She is ambulating in room independently without RW but required min cues for safety with longer distance ambulation.   Changed recommendation to no PT needs at d/c as pt is making good progress.   Follow Up Recommendations  Supervision - Intermittent     Equipment Recommendations  Other (comment)(has DME)    Recommendations for Other Services       Precautions / Restrictions Precautions Precautions: Fall Precaution Comments: 2L O2 baseline    Mobility  Bed Mobility Overal bed mobility: Independent                Transfers Overall transfer level: Modified independent Equipment used: None Transfers: Sit to/from Stand Sit to Stand: Supervision         General transfer comment: sit to stand x 3 - reports has been ambulating to bathroom independently  Ambulation/Gait Ambulation/Gait assistance: Supervision Gait Distance (Feet): 300 Feet Assistive device: Rolling walker (2 wheeled) Gait Pattern/deviations: Step-through pattern;Decreased stride length Gait velocity: mildly dec   General Gait Details: Tolerated well; DOE 2/4; 1 standing rest break; cued for RW proximity   Stairs             Wheelchair Mobility    Modified Rankin (Stroke Patients Only)       Balance Overall balance assessment: Needs assistance Sitting-balance support: No upper extremity supported;Feet  supported Sitting balance-Leahy Scale: Normal     Standing balance support: During functional activity;No upper extremity supported Standing balance-Leahy Scale: Good                              Cognition Arousal/Alertness: Awake/alert Behavior During Therapy: WFL for tasks assessed/performed Overall Cognitive Status: Within Functional Limits for tasks assessed                                 General Comments: A/O x4      Exercises      General Comments General comments (skin integrity, edema, etc.): On 2 LPM O2 with sats 98% or >.      Pertinent Vitals/Pain Pain Assessment: Faces Faces Pain Scale: Hurts a little bit Pain Location: R great toe-reports better than yesterday Pain Descriptors / Indicators: Constant;Discomfort;Sore Pain Intervention(s): Limited activity within patient's tolerance    Home Living                      Prior Function            PT Goals (current goals can now be found in the care plan section) Acute Rehab PT Goals Patient Stated Goal: return home PT Goal Formulation: With patient Time For Goal Achievement: 07/13/19 Potential to Achieve Goals: Good Progress towards PT goals: Progressing toward goals    Frequency    Min 3X/week      PT Plan Discharge plan  needs to be updated    Co-evaluation              AM-PAC PT "6 Clicks" Mobility   Outcome Measure  Help needed turning from your back to your side while in a flat bed without using bedrails?: None Help needed moving from lying on your back to sitting on the side of a flat bed without using bedrails?: None Help needed moving to and from a bed to a chair (including a wheelchair)?: None Help needed standing up from a chair using your arms (e.g., wheelchair or bedside chair)?: None Help needed to walk in hospital room?: None Help needed climbing 3-5 steps with a railing? : A Little 6 Click Score: 23    End of Session Equipment Utilized  During Treatment: Gait belt;Oxygen Activity Tolerance: Patient tolerated treatment well Patient left: with call bell/phone within reach;in bed Nurse Communication: Mobility status PT Visit Diagnosis: Unsteadiness on feet (R26.81);Other abnormalities of gait and mobility (R26.89);Muscle weakness (generalized) (M62.81)     Time: 5056-9794 PT Time Calculation (min) (ACUTE ONLY): 20 min  Charges:  $Gait Training: 8-22 mins                     Maggie Font, PT Acute Rehab Services Pager [phone] [facility] [phone] [facility] [phone]    Karlton Lemon 07/01/2019, 12:58 PM

## 2019-07-01 NOTE — Discharge Summary (Signed)
Physician Discharge Summary  Brandi Gardner IPJ:[id number] DOB: Mar 27, 1934 DOA: 06/27/2019  PCP: Cyndi Bender, PA-C  Admit date: 06/27/2019 Discharge date: 07/01/2019  Admitted From: Home Discharge disposition: Home   Recommendations for Outpatient Follow-Up:   1. Consider referral to pulmonologist as it appears bulk of her issues with wheezing are related to the lungs 2. Pulmonary toilet with incentive spirometer and flutter valve 3. BMP 1 week 4. Outpatient cardiology follow-up for titration of medications: Consider re- addition of Aldactone on Monday/Wednesday/Friday   Discharge Diagnosis:   Principal Problem:   Acute exacerbation of CHF (congestive heart failure) (HCC) Active Problems:   Chronic respiratory failure with hypoxia (HCC)   NSVT (nonsustained ventricular tachycardia) (HCC)   COPD (chronic obstructive pulmonary disease) ([place])   AKI (acute kidney injury) ([place])    Discharge Condition: Improved.  Diet recommendation: Low sodium, heart healthy  Wound care: None.  Code status: Full.   History of Present Illness:  Brandi Gardner is a 85 y.o. female with medical history significant of chronic systolic CHF status post AICD, CAD status post CABG, COPD, chronic respiratory failure on continuous 2 L home oxygen, history of PE currently not on anticoagulation, hypertension, hyperlipidemia, anxiety presenting with complaints of shortness of breath and ICD dysfunction.  Patient reports 1 month history of progressively worsening dyspnea on exertion, cough, and bilateral lower extremity edema.  Denies fevers or chills.  Reports taking Lasix 80 mg daily.  States she had a blood clot in her left lung back in either 1989 or 1990 and was on anticoagulation at that time.  Currently not on anticoagulation.  ED Course: Afebrile.  Slightly tachypneic on arrival.  Placed on 2 L supplemental oxygen.  Labs showing no leukocytosis.  Hemoglobin 10.7, stable compared to prior  labs.  BUN 34, creatinine 1.6.  Creatinine was ranging between 1.0-1.4 in June 2019.  BNP 446.  High-sensitivity troponin negative x2.  EKG without acute changes.    Hospital Course by Problem:   Dyspnea/acute exacerbation of chronic systolic CHF: -Echocardiogram done here showed ejection fraction of 20 to 25%, severely decreased left ventricular function, global hypokinesis, grade 1 diastolic dysfunction. Her previous echocardiogram had showed ejection fraction of 25 to 30%. She is status post AICD. -Cardiology appreciated:Resume furosemide 40 mg/day.  Potentially consider adding low-dose spironolactone 12.5 mg Monday Wednesday and Friday as an outpatient..  Will need laboratory data done 7 to 10 days after discharge   Chronic hypoxic respiratory failure:  -Stable on 2 L of oxygen at home. Continue supplemental oxygen. -Pulmonary toilet  Nonsustained VT: -AICD interrogation showing multiple episodes of NSVT. No shock delivered.  - monitor electrolytes.  COPD: -Stable. Not smoking at present.  -continue home inhalers. Continue bronchodilators as needed.  AKI on CKD stage IIIb:  -Likely from cardiorenal syndrome due to decompensated CHF.  -Continue Lasix per cardiology with close monitoring of kidney function.  -Her creatinine ranges from 1-1.4.  History of PE not on anticoagulation: -VQ scan low risk for PE  Debility/deconditioning: -PT/OT recommends home health    Medical Consultants:    Cardiology  Discharge Exam:   Vitals:   07/01/19 0855 07/01/19 1206  BP: 120/67 (!) 106/59  Pulse: 86 83  Resp:  16  Temp:  98 F (36.7 C)  SpO2: 100% 100%   Vitals:   07/01/19 0006 07/01/19 0624 07/01/19 0855 07/01/19 1206  BP: 103/62 118/72 120/67 (!) 106/59  Pulse: 75 86 86 83  Resp: 16 17  16  Temp: 97.9 F (36.6 C) 98 F (36.7 C)  98 F (36.7 C)  TempSrc: Oral Oral  Oral  SpO2: 100% 100% 100% 100%  Weight:      Height:        General exam:  Appears calm and comfortable. No wheezing but patient with congestion in the upper airways, able to clear with deep cough   The results of significant diagnostics from this hospitalization (including imaging, microbiology, ancillary and laboratory) are listed below for reference.     Procedures and Diagnostic Studies:   DG Chest 2 View  Result Date: 06/27/2019 CLINICAL DATA:  85 year old female with CHF exacerbation. EXAM: CHEST - 2 VIEW COMPARISON:  Chest radiograph dated 06/19/2019. FINDINGS: Small bilateral pleural effusions with minimal bibasilar atelectasis. Pneumonia is not excluded. Clinical correlation is recommended. There is no pneumothorax. There is mild cardiomegaly. No vascular congestion or edema. Left pectoral AICD device. Median sternotomy wires. Osteopenia with degenerative changes of the spine. Age indeterminate, likely old, lower thoracic compression fractures as well as old compression fracture of L1 with vertebroplasty. Correlation with point tenderness recommended. IMPRESSION: 1. Small bilateral pleural effusions with minimal bibasilar atelectasis. Pneumonia is not excluded. 2. Mild cardiomegaly.  No congestion or edema. Electronically Signed   By: Anner Crete M.D.   On: 06/27/2019 18:16   NM Pulmonary Perf and Vent  Result Date: 06/28/2019 CLINICAL DATA:  Shortness of breath for 1.5 months, history COPD, emphysema, bronchitis EXAM: NUCLEAR MEDICINE VENTILATION - PERFUSION LUNG SCAN TECHNIQUE: Ventilation images were obtained in multiple projections using inhaled aerosol Tc-32m DTPA. Perfusion images were obtained in multiple projections after intravenous injection of Tc-25m MAA. RADIOPHARMACEUTICALS:  40.3 mCi of Tc-35m DTPA aerosol inhalation and 1.8 mCi Tc49m MAA IV COMPARISON:  None; correlation chest radiograph 06/27/2019 FINDINGS: Ventilation: Subsegmental ventilation defects in the RIGHT middle lobe, RIGHT lower lobe, LEFT lower lobe. Remaining ventilation normal  Perfusion: Matching subsegmental perfusion defects in BILATERAL lower lobes and RIGHT middle lobe. No additional perfusion defects. Chest radiograph: Bibasilar pleural effusions and minimal bibasilar atelectasis IMPRESSION: Low probability for pulmonary embolism. Electronically Signed   By: Lavonia Dana M.D.   On: 06/28/2019 15:29   ECHOCARDIOGRAM COMPLETE  Result Date: 06/28/2019    ECHOCARDIOGRAM REPORT   Patient Name:   Brandi Dils Date of Exam: 06/28/2019 Medical Rec #:  [id number]     Height:       64.0 in Accession #:    [id number]    Weight:       148.0 lb Date of Birth:  26-Jul-1934    BSA:          1.721 m Patient Age:    21 years      BP:           116/63 mmHg Patient Gender: F             HR:           89 bpm. Exam Location:  Inpatient Procedure: 2D Echo Indications:    acute systolic chf 749.44  History:        Patient has prior history of Echocardiogram examinations. CHF,                 Prior CABG and Defibrillator, COPD, Signs/Symptoms:Shortness of                 Breath and lower extremity edema; Risk Factors:Hypertension and  Dyslipidemia.  Sonographer:    Johny Chess Referring Phys: [id number] [place]  1. Severe global reduction in LV systolic function; severe LVE; grade 1 diastolic dysfunction; mild to moderate MR; moderate LAE.  2. Left ventricular ejection fraction, by estimation, is 20 to 25%. The left ventricle has severely decreased function. The left ventricle demonstrates global hypokinesis. The left ventricular internal cavity size was severely dilated. Left ventricular diastolic parameters are consistent with Grade I diastolic dysfunction (impaired relaxation). Elevated left atrial pressure.  3. Right ventricular systolic function is normal. The right ventricular size is normal.  4. Left atrial size was moderately dilated.  5. The mitral valve is normal in structure. Mild to moderate mitral valve regurgitation. No evidence of mitral stenosis.  6.  The aortic valve was not well visualized. Aortic valve regurgitation is not visualized. No aortic stenosis is present.  7. The inferior vena cava is normal in size with greater than 50% respiratory variability, suggesting right atrial pressure of 3 mmHg. FINDINGS  Left Ventricle: Left ventricular ejection fraction, by estimation, is 20 to 25%. The left ventricle has severely decreased function. The left ventricle demonstrates global hypokinesis. The left ventricular internal cavity size was severely dilated. There is no left ventricular hypertrophy. Left ventricular diastolic parameters are consistent with Grade I diastolic dysfunction (impaired relaxation). Elevated left atrial pressure. Right Ventricle: The right ventricular size is normal. Right ventricular systolic function is normal. Left Atrium: Left atrial size was moderately dilated. Right Atrium: Right atrial size was normal in size. Pericardium: There is no evidence of pericardial effusion. Mitral Valve: The mitral valve is normal in structure. Normal mobility of the mitral valve leaflets. Mild mitral annular calcification. Mild to moderate mitral valve regurgitation. No evidence of mitral valve stenosis. Tricuspid Valve: The tricuspid valve is normal in structure. Tricuspid valve regurgitation is trivial. No evidence of tricuspid stenosis. Aortic Valve: The aortic valve was not well visualized. Aortic valve regurgitation is not visualized. No aortic stenosis is present. Pulmonic Valve: The pulmonic valve was not well visualized. Pulmonic valve regurgitation is not visualized. No evidence of pulmonic stenosis. Aorta: The aortic root is normal in size and structure. Venous: The inferior vena cava is normal in size with greater than 50% respiratory variability, suggesting right atrial pressure of 3 mmHg. IAS/Shunts: No atrial level shunt detected by color flow Doppler. Additional Comments: Severe global reduction in LV systolic function; severe LVE; grade 1  diastolic dysfunction; mild to moderate MR; moderate LAE.  LEFT VENTRICLE PLAX 2D LVIDd:         6.90 cm      Diastology LVIDs:         5.50 cm      LV e' lateral:   9.25 cm/s LV PW:         1.00 cm      LV E/e' lateral: 11.8 LV IVS:        1.00 cm      LV e' medial:    3.48 cm/s LVOT diam:     1.80 cm      LV E/e' medial:  31.3 LV SV:         63 LV SV Index:   37 LVOT Area:     2.54 cm  LV Volumes (MOD) LV vol d, MOD A2C: 133.0 ml LV vol s, MOD A2C: 103.0 ml LV SV MOD A2C:     30.0 ml RIGHT VENTRICLE RV S prime:     10.10 cm/s TAPSE (M-mode): 1.3  cm LEFT ATRIUM             Index       RIGHT ATRIUM           Index LA diam:        4.60 cm 2.67 cm/m  RA Area:     12.10 cm LA Vol (A2C):   78.9 ml 45.84 ml/m RA Volume:   27.60 ml  16.03 ml/m LA Vol (A4C):   79.2 ml 46.01 ml/m LA Biplane Vol: 79.8 ml 46.36 ml/m  AORTIC VALVE LVOT Vmax:   136.00 cm/s LVOT Vmean:  89.800 cm/s LVOT VTI:    0.249 m  AORTA Ao Root diam: 2.70 cm MITRAL VALVE MV Area (PHT): 4.36 cm      SHUNTS MV Decel Time: 174 msec      Systemic VTI:  0.25 m MR Peak grad:    100.8 mmHg  Systemic Diam: 1.80 cm MR Mean grad:    66.0 mmHg MR Vmax:         502.00 cm/s MR Vmean:        383.0 cm/s MR PISA:         1.57 cm MR PISA Eff ROA: 13 mm MR PISA Radius:  0.50 cm MV E velocity: 109.00 cm/s MV A velocity: 130.00 cm/s MV E/A ratio:  0.84 Kirk Ruths MD Electronically signed by Kirk Ruths MD Signature Date/Time: 06/28/2019/12:44:33 PM    Final      Labs:   Basic Metabolic Panel: Recent Labs  Lab 06/27/19 1734 06/27/19 1734 06/27/19 2323 06/28/19 4098 06/28/19 1191 06/29/19 0505 06/29/19 0505 06/30/19 0523 07/01/19 0553  NA 137  --   --  140  --  141  --  142 138  K 3.7   < >  --  5.1   < > 4.2   < > 5.0 4.6  CL 99  --   --  98  --  99  --  99 96*  CO2 27  --   --  31  --  29  --  32 28  GLUCOSE 193*  --   --  159*  --  111*  --  123* 106*  BUN 34*  --   --  35*  --  37*  --  42* 44*  CREATININE 1.64*  --   --  1.68*  --   1.60*  --  1.72* 1.63*  CALCIUM 8.9  --   --  9.0  --  9.1  --  9.4 9.3  MG  --   --  2.3  --   --   --   --   --   --    < > = values in this interval not displayed.   GFR Estimated Creatinine Clearance: 25.1 mL/min (A) (by C-G formula based on SCr of 1.63 mg/dL (H)). Liver Function Tests: No results for input(s): AST, ALT, ALKPHOS, BILITOT, PROT, ALBUMIN in the last 168 hours. No results for input(s): LIPASE, AMYLASE in the last 168 hours. No results for input(s): AMMONIA in the last 168 hours. Coagulation profile No results for input(s): INR, PROTIME in the last 168 hours.  CBC: Recent Labs  Lab 06/27/19 1734 06/29/19 0505  WBC 9.4 6.8  NEUTROABS 7.7  --   HGB 10.7* 10.6*  HCT 35.0* 34.4*  MCV 96.4 93.5  PLT 291 284   Cardiac Enzymes: No results for input(s): CKTOTAL, CKMB, CKMBINDEX, TROPONINI in the last 168 hours. BNP: Invalid  input(s): POCBNP CBG: No results for input(s): GLUCAP in the last 168 hours. D-Dimer No results for input(s): DDIMER in the last 72 hours. Hgb A1c No results for input(s): HGBA1C in the last 72 hours. Lipid Profile No results for input(s): CHOL, HDL, LDLCALC, TRIG, CHOLHDL, LDLDIRECT in the last 72 hours. Thyroid function studies No results for input(s): TSH, T4TOTAL, T3FREE, THYROIDAB in the last 72 hours.  Invalid input(s): FREET3 Anemia work up No results for input(s): VITAMINB12, FOLATE, FERRITIN, TIBC, IRON, RETICCTPCT in the last 72 hours. Microbiology Recent Results (from the past 240 hour(s))  SARS CORONAVIRUS 2 (TAT 6-24 HRS) Nasopharyngeal Nasopharyngeal Swab     Status: None   Collection Time: 06/27/19 10:47 PM   Specimen: Nasopharyngeal Swab  Result Value Ref Range Status   SARS Coronavirus 2 NEGATIVE NEGATIVE Final    Comment: (NOTE) SARS-CoV-2 target nucleic acids are NOT DETECTED. The SARS-CoV-2 RNA is generally detectable in upper and lower respiratory specimens during the acute phase of infection. Negative results do  not preclude SARS-CoV-2 infection, do not rule out co-infections with other pathogens, and should not be used as the sole basis for treatment or other patient management decisions. Negative results must be combined with clinical observations, patient history, and epidemiological information. The expected result is Negative. Fact Sheet for Patients: SugarRoll.be Fact Sheet for Healthcare Providers: https://www.woods-mathews.com/ This test is not yet approved or cleared by the [place] FDA and  has been authorized for detection and/or diagnosis of SARS-CoV-2 by FDA under an Emergency Use Authorization (EUA). This EUA will remain  in effect (meaning this test can be used) for the duration of the COVID-19 declaration under Section 56 4(b)(1) of the Act, 21 U.S.C. section 360bbb-3(b)(1), unless the authorization is terminated or revoked sooner. Performed at [facility], [place] [address] [place], [place] [address]      Discharge Instructions:   Discharge Instructions    (HEART FAILURE PATIENTS) Call MD:  Anytime you have any of the following symptoms: 1) 3 pound weight gain in 24 hours or 5 pounds in 1 week 2) shortness of breath, with or without a dry hacking cough 3) swelling in the hands, feet or stomach 4) if you have to sleep on extra pillows at night in order to breathe.   Complete by: As directed    Diet - low sodium heart healthy   Complete by: As directed    Discharge instructions   Complete by: As directed    Home health Continue pulmonary toilet/flutter valve Close follow up with your cardiologist-- will need labs and further adjustments of medications   Increase activity slowly   Complete by: As directed      Allergies as of 07/01/2019      Reactions   Diphenhydramine Shortness Of Breath, Rash, Other (See Comments)   Has COPD, also   Doxycycline Itching   Hydrocodone Shortness Of Breath, Itching, Rash   Oxycodone  Hives, Swelling, Other (See Comments)   Percocet = Throat Swelling caused her to go to ER    Penicillins Other (See Comments)   Headache, childhood allergy   Potassium-containing Compounds Other (See Comments)   Mouth/jaw pain   Tramadol Itching, Swelling, Rash, Other (See Comments)   Rash and Throat swelling caused her to go to ER   Hydrocodone-acetaminophen Hives   Other Rash, Other (See Comments)   Oxygen tubing- BREAKS OUT THE SKIN IN RED, RAISED PATCHES   Percocet [oxycodone-acetaminophen] Hives   Vicodin [hydrocodone-acetaminophen] Hives  Potassium Other (See Comments)   Pain in the mouth/jaws   Statins Nausea Only      Medication List    STOP taking these medications   spironolactone 25 MG tablet Commonly known as: ALDACTONE     TAKE these medications   acetaminophen 500 MG tablet Commonly known as: TYLENOL Take 500 mg by mouth every 6 (six) hours as needed for mild pain or headache.   albuterol (2.5 MG/3ML) 0.083% nebulizer solution Commonly known as: PROVENTIL Take 2.5 mg by nebulization in the morning and at bedtime.   ProAir HFA 108 (90 Base) MCG/ACT inhaler Generic drug: albuterol Inhale 2 puffs into the lungs every 6 (six) hours as needed for wheezing or shortness of breath.   ALPRAZolam 1 MG tablet Commonly known as: XANAX Take 1 tablet (1 mg total) by mouth 2 (two) times daily as needed for anxiety. What changed: when to take this   aspirin EC 81 MG tablet Take 81 mg by mouth daily.   Breo Ellipta 100-25 MCG/INH Aepb Generic drug: fluticasone furoate-vilanterol Inhale 1 puff into the lungs daily. What changed: Another medication with the same name was removed. Continue taking this medication, and follow the directions you see here.   budesonide 0.5 MG/2ML nebulizer solution Commonly known as: PULMICORT Take 0.5 mg by nebulization in the morning and at bedtime.   carvedilol 3.125 MG tablet Commonly known as: COREG Take 3.125 mg by mouth 2 (two)  times daily with a meal.   D3-50 1.25 MG (50000 UT) capsule Generic drug: Cholecalciferol Take 50,000 Units by mouth every Tuesday.   EQ ALLERGY & SINUS HEADACHE PO Take 1 tablet by mouth every 6 (six) hours as needed (for sinus symptoms/congestion).   ferrous sulfate 324 (65 Fe) MG Tbec Take 324 mg by mouth daily with breakfast.   fluticasone 50 MCG/ACT nasal spray Commonly known as: FLONASE Place 2 sprays into both nostrils daily.   furosemide 40 MG tablet Commonly known as: LASIX Take 2 tablets (80 mg total) by mouth in the morning.   nitroGLYCERIN 0.4 MG SL tablet Commonly known as: NITROSTAT Place 0.4 mg under the tongue every 5 (five) minutes as needed for chest pain.   nystatin-triamcinolone cream Commonly known as: MYCOLOG II Apply 1 application topically 2 (two) times daily as needed (for yeast).   OXYGEN Inhale 2 L/min into the lungs continuous.   pantoprazole 40 MG tablet Commonly known as: PROTONIX Take 40 mg by mouth daily before breakfast.   Probiotic Caps Take 1 capsule by mouth daily.   tiotropium 18 MCG inhalation capsule Commonly known as: SPIRIVA Place 18 mcg into inhaler and inhale daily.   tiZANidine 4 MG tablet Commonly known as: ZANAFLEX Take 4 mg by mouth every 8 (eight) hours as needed (for back pain).         Time coordinating discharge: 35 min  Signed:  Geradine Girt DO  [facility] 07/01/2019, 4:37 PM

## 2019-07-01 NOTE — Progress Notes (Addendum)
The patient has been seen in conjunction with Vin Bhagat, PAC. All aspects of care have been considered and discussed. The patient has been personally interviewed, examined, and all clinical data has been reviewed.   I am concerned that her wheezing represents pulmonary more than cardiac dyspnea, especially with bump in creatinine after only 2 doses of IV Lasix.  Resume furosemide 40 mg/day.  Potentially consider adding low-dose spironolactone 12.5 mg Monday Wednesday and Friday as an outpatient..  Will need laboratory data done 7 to 10 days after discharge. [facility] will sign off.   Medication Recommendations: Furosemide 80 mg daily Other recommendations (labs, testing, etc): Bmet 1 week  Follow up as an outpatient: Needs follow-up with her primary cardiologist in 7 to 10 days.     Progress Note  [name] Date of Encounter: 07/01/2019  Primary Cardiologist: Mathis Bud, MD   Subjective   Breathing improving. Getting nebulizer tx.   Inpatient Medications    Scheduled Meds: . acidophilus  1 capsule Oral Daily  . albuterol  2.5 mg Nebulization Q12H  . aspirin EC  81 mg Oral Daily  . budesonide  0.5 mg Nebulization BID  . carvedilol  3.125 mg Oral BID WC  . docusate sodium  100 mg Oral BID  . ferrous sulfate  324 mg Oral Q breakfast  . fluticasone  2 spray Each Nare Daily  . furosemide  80 mg Oral Daily  . heparin injection (subcutaneous)  5,000 Units Subcutaneous Q8H  . pantoprazole  40 mg Oral QAC breakfast  . polyethylene glycol  17 g Oral Daily  . sodium chloride flush  3 mL Intravenous Q12H  . umeclidinium bromide  1 puff Inhalation Daily  . Vitamin D (Ergocalciferol)  50,000 Units Oral Q Tue   Continuous Infusions: . sodium chloride     PRN Meds: sodium chloride, albuterol, ALPRAZolam, ipratropium-albuterol, magnesium hydroxide, sodium chloride flush, tiZANidine   Vital Signs    Vitals:   06/30/19 2039 07/01/19 0006 07/01/19 0624  07/01/19 0855  BP:  103/62 118/72 120/67  Pulse:  75 86 86  Resp:  16 17   Temp:  97.9 F (36.6 C) 98 F (36.7 C)   TempSrc:  Oral Oral   SpO2: 96% 100% 100% 100%  Weight:      Height:        Intake/Output Summary (Last 24 hours) at 07/01/2019 0859 Last data filed at 06/30/2019 1734 Gross per 24 hour  Intake 480 ml  Output --  Net 480 ml   Last 3 Weights 06/27/2019 08/30/2017 08/29/2017  Weight (lbs) 148 lb 125 lb 1.6 oz 123 lb 14.4 oz  Weight (kg) 67.132 kg 56.745 kg 56.2 kg      Telemetry    NSR- Personally Reviewed  ECG    N/A  Physical Exam   GEN: Elderly frail female in no acute distress.   Neck: No JVD Cardiac: RRR, + murmurs, rubs, or gallops.  Respiratory: wheezing throughout  GI: Soft, nontender, non-distended  MS: No edema; No deformity. Neuro:  Nonfocal  Psych: Normal affect   Labs    High Sensitivity Troponin:   Recent Labs  Lab 06/27/19 1734 06/27/19 1922  TROPONINIHS 14 17      Chemistry Recent Labs  Lab 06/29/19 0505 06/30/19 0523 07/01/19 0553  NA 141 142 138  K 4.2 5.0 4.6  CL 99 99 96*  CO2 29 32 28  GLUCOSE 111* 123* 106*  BUN 37* 42* 44*  CREATININE  1.60* 1.72* 1.63*  CALCIUM 9.1 9.4 9.3  GFRNONAA 30* 27* 29*  GFRAA 34* 32* 34*  ANIONGAP 13 11 14      Hematology Recent Labs  Lab 06/27/19 1734 06/29/19 0505  WBC 9.4 6.8  RBC 3.63* 3.68*  HGB 10.7* 10.6*  HCT 35.0* 34.4*  MCV 96.4 93.5  MCH 29.5 28.8  MCHC 30.6 30.8  RDW 14.8 14.8  PLT 291 284    BNP Recent Labs  Lab 06/27/19 1734  BNP 446.8*     DDimer  Recent Labs  Lab 06/27/19 2323  DDIMER 1.55*     Radiology    No results found.  Cardiac Studies   Echo 06/28/19 1. Severe global reduction in LV systolic function; severe LVE; grade 1  diastolic dysfunction; mild to moderate MR; moderate LAE.  2. Left ventricular ejection fraction, by estimation, is 20 to 25%. The  left ventricle has severely decreased function. The left ventricle   demonstrates global hypokinesis. The left ventricular internal cavity size  was severely dilated. Left ventricular  diastolic parameters are consistent with Grade I diastolic dysfunction  (impaired relaxation). Elevated left atrial pressure.  3. Right ventricular systolic function is normal. The right ventricular  size is normal.  4. Left atrial size was moderately dilated.  5. The mitral valve is normal in structure. Mild to moderate mitral valve  regurgitation. No evidence of mitral stenosis.  6. The aortic valve was not well visualized. Aortic valve regurgitation  is not visualized. No aortic stenosis is present.  7. The inferior vena cava is normal in size with greater than 50%  respiratory variability, suggesting right atrial pressure of 3 mmHg.   Patient Profile     85 y.o. female with a hx of S-CHF, EF 20% s/p MDT VISIA 04/2015, VT & VF arrest 07/24/2017 tx from [facility] to [facility] but elevated Cr so cath not done, CKD III, COPD on O2, CAD s/p CABG, HTN, HLD, PE presented with dyspnea.   Assessment & Plan    1. Acute on chronic combined CHF - Echo with LVEF of 10-27%, grade 1 diastolic dysfunction; mild to moderate MR; moderate LAE.  - I & O inaccurate. No daily weight.  - Breathing improved with diuresis and breathing tx (wheezing through out on exam>> suspected CHF and COPD exacerbation as etiology of dyspnea) - SCr improved with holding IV lasix>> plan to resume po lasix>> will need BMET next week with PCP or primary cardiologist - No ACE/ARB due to AonCKD>> consider as outpatient otherwise Imdur/hydralazine (BP improving) - Continue low dose BB  2. Acute on CKD III - Scr improved. As above.   Likely home today. Medications per MD. She will follow up with regular cardiologist.   For questions or updates, please contact [place] Please consult [email or web address] for contact info under        SignedLeanor Kail, PA  07/01/2019, 8:59 AM

## 2019-07-06 DIAGNOSIS — E785 Hyperlipidemia, unspecified: Secondary | ICD-10-CM | POA: Diagnosis not present

## 2019-07-06 DIAGNOSIS — J449 Chronic obstructive pulmonary disease, unspecified: Secondary | ICD-10-CM | POA: Diagnosis not present

## 2019-07-06 DIAGNOSIS — Z7982 Long term (current) use of aspirin: Secondary | ICD-10-CM | POA: Diagnosis not present

## 2019-07-06 DIAGNOSIS — I255 Ischemic cardiomyopathy: Secondary | ICD-10-CM | POA: Diagnosis not present

## 2019-07-06 DIAGNOSIS — Z86718 Personal history of other venous thrombosis and embolism: Secondary | ICD-10-CM | POA: Diagnosis not present

## 2019-07-06 DIAGNOSIS — I051 Rheumatic mitral insufficiency: Secondary | ICD-10-CM | POA: Diagnosis not present

## 2019-07-06 DIAGNOSIS — Z951 Presence of aortocoronary bypass graft: Secondary | ICD-10-CM | POA: Diagnosis not present

## 2019-07-06 DIAGNOSIS — I5023 Acute on chronic systolic (congestive) heart failure: Secondary | ICD-10-CM | POA: Diagnosis not present

## 2019-07-06 DIAGNOSIS — I130 Hypertensive heart and chronic kidney disease with heart failure and stage 1 through stage 4 chronic kidney disease, or unspecified chronic kidney disease: Secondary | ICD-10-CM | POA: Diagnosis not present

## 2019-07-06 DIAGNOSIS — I2510 Atherosclerotic heart disease of native coronary artery without angina pectoris: Secondary | ICD-10-CM | POA: Diagnosis not present

## 2019-07-06 DIAGNOSIS — F419 Anxiety disorder, unspecified: Secondary | ICD-10-CM | POA: Diagnosis not present

## 2019-07-06 DIAGNOSIS — Z9181 History of falling: Secondary | ICD-10-CM | POA: Diagnosis not present

## 2019-07-06 DIAGNOSIS — Z7951 Long term (current) use of inhaled steroids: Secondary | ICD-10-CM | POA: Diagnosis not present

## 2019-07-06 DIAGNOSIS — I0981 Rheumatic heart failure: Secondary | ICD-10-CM | POA: Diagnosis not present

## 2019-07-06 DIAGNOSIS — Z86711 Personal history of pulmonary embolism: Secondary | ICD-10-CM | POA: Diagnosis not present

## 2019-07-06 DIAGNOSIS — Z9981 Dependence on supplemental oxygen: Secondary | ICD-10-CM | POA: Diagnosis not present

## 2019-07-06 DIAGNOSIS — J9611 Chronic respiratory failure with hypoxia: Secondary | ICD-10-CM | POA: Diagnosis not present

## 2019-07-06 DIAGNOSIS — N1832 Chronic kidney disease, stage 3b: Secondary | ICD-10-CM | POA: Diagnosis not present

## 2019-07-06 DIAGNOSIS — I472 Ventricular tachycardia: Secondary | ICD-10-CM | POA: Diagnosis not present

## 2019-07-06 DIAGNOSIS — Z95810 Presence of automatic (implantable) cardiac defibrillator: Secondary | ICD-10-CM | POA: Diagnosis not present

## 2019-07-06 DIAGNOSIS — N179 Acute kidney failure, unspecified: Secondary | ICD-10-CM | POA: Diagnosis not present

## 2019-07-08 DIAGNOSIS — I0981 Rheumatic heart failure: Secondary | ICD-10-CM | POA: Diagnosis not present

## 2019-07-08 DIAGNOSIS — I5023 Acute on chronic systolic (congestive) heart failure: Secondary | ICD-10-CM | POA: Diagnosis not present

## 2019-07-08 DIAGNOSIS — I472 Ventricular tachycardia: Secondary | ICD-10-CM | POA: Diagnosis not present

## 2019-07-08 DIAGNOSIS — I130 Hypertensive heart and chronic kidney disease with heart failure and stage 1 through stage 4 chronic kidney disease, or unspecified chronic kidney disease: Secondary | ICD-10-CM | POA: Diagnosis not present

## 2019-07-08 DIAGNOSIS — N1832 Chronic kidney disease, stage 3b: Secondary | ICD-10-CM | POA: Diagnosis not present

## 2019-07-08 DIAGNOSIS — N179 Acute kidney failure, unspecified: Secondary | ICD-10-CM | POA: Diagnosis not present

## 2019-07-11 DIAGNOSIS — N179 Acute kidney failure, unspecified: Secondary | ICD-10-CM | POA: Diagnosis not present

## 2019-07-11 DIAGNOSIS — N1832 Chronic kidney disease, stage 3b: Secondary | ICD-10-CM | POA: Diagnosis not present

## 2019-07-11 DIAGNOSIS — I0981 Rheumatic heart failure: Secondary | ICD-10-CM | POA: Diagnosis not present

## 2019-07-11 DIAGNOSIS — I130 Hypertensive heart and chronic kidney disease with heart failure and stage 1 through stage 4 chronic kidney disease, or unspecified chronic kidney disease: Secondary | ICD-10-CM | POA: Diagnosis not present

## 2019-07-11 DIAGNOSIS — I5023 Acute on chronic systolic (congestive) heart failure: Secondary | ICD-10-CM | POA: Diagnosis not present

## 2019-07-11 DIAGNOSIS — I472 Ventricular tachycardia: Secondary | ICD-10-CM | POA: Diagnosis not present

## 2019-07-12 DIAGNOSIS — I0981 Rheumatic heart failure: Secondary | ICD-10-CM | POA: Diagnosis not present

## 2019-07-12 DIAGNOSIS — I130 Hypertensive heart and chronic kidney disease with heart failure and stage 1 through stage 4 chronic kidney disease, or unspecified chronic kidney disease: Secondary | ICD-10-CM | POA: Diagnosis not present

## 2019-07-12 DIAGNOSIS — N1832 Chronic kidney disease, stage 3b: Secondary | ICD-10-CM | POA: Diagnosis not present

## 2019-07-12 DIAGNOSIS — I5023 Acute on chronic systolic (congestive) heart failure: Secondary | ICD-10-CM | POA: Diagnosis not present

## 2019-07-12 DIAGNOSIS — N179 Acute kidney failure, unspecified: Secondary | ICD-10-CM | POA: Diagnosis not present

## 2019-07-12 DIAGNOSIS — I472 Ventricular tachycardia: Secondary | ICD-10-CM | POA: Diagnosis not present

## 2019-07-14 DIAGNOSIS — N179 Acute kidney failure, unspecified: Secondary | ICD-10-CM | POA: Diagnosis not present

## 2019-07-14 DIAGNOSIS — I130 Hypertensive heart and chronic kidney disease with heart failure and stage 1 through stage 4 chronic kidney disease, or unspecified chronic kidney disease: Secondary | ICD-10-CM | POA: Diagnosis not present

## 2019-07-14 DIAGNOSIS — I472 Ventricular tachycardia: Secondary | ICD-10-CM | POA: Diagnosis not present

## 2019-07-14 DIAGNOSIS — I5023 Acute on chronic systolic (congestive) heart failure: Secondary | ICD-10-CM | POA: Diagnosis not present

## 2019-07-14 DIAGNOSIS — I0981 Rheumatic heart failure: Secondary | ICD-10-CM | POA: Diagnosis not present

## 2019-07-14 DIAGNOSIS — N1832 Chronic kidney disease, stage 3b: Secondary | ICD-10-CM | POA: Diagnosis not present

## 2019-07-18 DIAGNOSIS — I0981 Rheumatic heart failure: Secondary | ICD-10-CM | POA: Diagnosis not present

## 2019-07-18 DIAGNOSIS — N179 Acute kidney failure, unspecified: Secondary | ICD-10-CM | POA: Diagnosis not present

## 2019-07-18 DIAGNOSIS — I472 Ventricular tachycardia: Secondary | ICD-10-CM | POA: Diagnosis not present

## 2019-07-18 DIAGNOSIS — I5023 Acute on chronic systolic (congestive) heart failure: Secondary | ICD-10-CM | POA: Diagnosis not present

## 2019-07-18 DIAGNOSIS — I130 Hypertensive heart and chronic kidney disease with heart failure and stage 1 through stage 4 chronic kidney disease, or unspecified chronic kidney disease: Secondary | ICD-10-CM | POA: Diagnosis not present

## 2019-07-18 DIAGNOSIS — N1832 Chronic kidney disease, stage 3b: Secondary | ICD-10-CM | POA: Diagnosis not present

## 2019-07-21 DIAGNOSIS — J208 Acute bronchitis due to other specified organisms: Secondary | ICD-10-CM | POA: Diagnosis not present

## 2019-07-21 DIAGNOSIS — E46 Unspecified protein-calorie malnutrition: Secondary | ICD-10-CM | POA: Diagnosis not present

## 2019-07-21 DIAGNOSIS — E538 Deficiency of other specified B group vitamins: Secondary | ICD-10-CM | POA: Diagnosis not present

## 2019-07-21 DIAGNOSIS — I5023 Acute on chronic systolic (congestive) heart failure: Secondary | ICD-10-CM | POA: Diagnosis not present

## 2019-07-21 DIAGNOSIS — N179 Acute kidney failure, unspecified: Secondary | ICD-10-CM | POA: Diagnosis not present

## 2019-07-21 DIAGNOSIS — I0981 Rheumatic heart failure: Secondary | ICD-10-CM | POA: Diagnosis not present

## 2019-07-21 DIAGNOSIS — I472 Ventricular tachycardia: Secondary | ICD-10-CM | POA: Diagnosis not present

## 2019-07-21 DIAGNOSIS — J9611 Chronic respiratory failure with hypoxia: Secondary | ICD-10-CM | POA: Diagnosis not present

## 2019-07-21 DIAGNOSIS — I130 Hypertensive heart and chronic kidney disease with heart failure and stage 1 through stage 4 chronic kidney disease, or unspecified chronic kidney disease: Secondary | ICD-10-CM | POA: Diagnosis not present

## 2019-07-21 DIAGNOSIS — N1832 Chronic kidney disease, stage 3b: Secondary | ICD-10-CM | POA: Diagnosis not present

## 2019-07-27 DIAGNOSIS — N179 Acute kidney failure, unspecified: Secondary | ICD-10-CM | POA: Diagnosis not present

## 2019-07-27 DIAGNOSIS — I130 Hypertensive heart and chronic kidney disease with heart failure and stage 1 through stage 4 chronic kidney disease, or unspecified chronic kidney disease: Secondary | ICD-10-CM | POA: Diagnosis not present

## 2019-07-27 DIAGNOSIS — I472 Ventricular tachycardia: Secondary | ICD-10-CM | POA: Diagnosis not present

## 2019-07-27 DIAGNOSIS — N1832 Chronic kidney disease, stage 3b: Secondary | ICD-10-CM | POA: Diagnosis not present

## 2019-07-27 DIAGNOSIS — I5023 Acute on chronic systolic (congestive) heart failure: Secondary | ICD-10-CM | POA: Diagnosis not present

## 2019-07-27 DIAGNOSIS — I0981 Rheumatic heart failure: Secondary | ICD-10-CM | POA: Diagnosis not present

## 2019-07-29 ENCOUNTER — Emergency Department (HOSPITAL_COMMUNITY): Payer: Medicare Other

## 2019-07-29 ENCOUNTER — Other Ambulatory Visit: Payer: Self-pay

## 2019-07-29 ENCOUNTER — Encounter (HOSPITAL_COMMUNITY): Payer: Self-pay | Admitting: Internal Medicine

## 2019-07-29 ENCOUNTER — Inpatient Hospital Stay (HOSPITAL_COMMUNITY)
Admission: EM | Admit: 2019-07-29 | Discharge: 2019-08-05 | DRG: 191 | Disposition: A | Discharge status: Home Health | Payer: Medicare Other | Attending: Family Medicine | Admitting: Family Medicine

## 2019-07-29 DIAGNOSIS — R0789 Other chest pain: Secondary | ICD-10-CM | POA: Diagnosis not present

## 2019-07-29 DIAGNOSIS — Z87891 Personal history of nicotine dependence: Secondary | ICD-10-CM

## 2019-07-29 DIAGNOSIS — Z20822 Contact with and (suspected) exposure to covid-19: Secondary | ICD-10-CM | POA: Diagnosis not present

## 2019-07-29 DIAGNOSIS — R079 Chest pain, unspecified: Secondary | ICD-10-CM | POA: Diagnosis not present

## 2019-07-29 DIAGNOSIS — I2510 Atherosclerotic heart disease of native coronary artery without angina pectoris: Secondary | ICD-10-CM | POA: Diagnosis not present

## 2019-07-29 DIAGNOSIS — I25119 Atherosclerotic heart disease of native coronary artery with unspecified angina pectoris: Secondary | ICD-10-CM | POA: Diagnosis not present

## 2019-07-29 DIAGNOSIS — I509 Heart failure, unspecified: Secondary | ICD-10-CM

## 2019-07-29 DIAGNOSIS — Z951 Presence of aortocoronary bypass graft: Secondary | ICD-10-CM

## 2019-07-29 DIAGNOSIS — I447 Left bundle-branch block, unspecified: Secondary | ICD-10-CM | POA: Diagnosis not present

## 2019-07-29 DIAGNOSIS — I249 Acute ischemic heart disease, unspecified: Secondary | ICD-10-CM | POA: Diagnosis not present

## 2019-07-29 DIAGNOSIS — I130 Hypertensive heart and chronic kidney disease with heart failure and stage 1 through stage 4 chronic kidney disease, or unspecified chronic kidney disease: Secondary | ICD-10-CM | POA: Diagnosis present

## 2019-07-29 DIAGNOSIS — Z86711 Personal history of pulmonary embolism: Secondary | ICD-10-CM

## 2019-07-29 DIAGNOSIS — R339 Retention of urine, unspecified: Secondary | ICD-10-CM | POA: Diagnosis present

## 2019-07-29 DIAGNOSIS — E876 Hypokalemia: Secondary | ICD-10-CM | POA: Diagnosis present

## 2019-07-29 DIAGNOSIS — R0602 Shortness of breath: Secondary | ICD-10-CM

## 2019-07-29 DIAGNOSIS — Z9981 Dependence on supplemental oxygen: Secondary | ICD-10-CM

## 2019-07-29 DIAGNOSIS — J441 Chronic obstructive pulmonary disease with (acute) exacerbation: Secondary | ICD-10-CM | POA: Diagnosis not present

## 2019-07-29 DIAGNOSIS — I255 Ischemic cardiomyopathy: Secondary | ICD-10-CM | POA: Diagnosis present

## 2019-07-29 DIAGNOSIS — Z7982 Long term (current) use of aspirin: Secondary | ICD-10-CM

## 2019-07-29 DIAGNOSIS — I5022 Chronic systolic (congestive) heart failure: Secondary | ICD-10-CM

## 2019-07-29 DIAGNOSIS — R000 Tachycardia, unspecified: Secondary | ICD-10-CM | POA: Diagnosis not present

## 2019-07-29 DIAGNOSIS — E785 Hyperlipidemia, unspecified: Secondary | ICD-10-CM | POA: Diagnosis present

## 2019-07-29 DIAGNOSIS — Z888 Allergy status to other drugs, medicaments and biological substances status: Secondary | ICD-10-CM

## 2019-07-29 DIAGNOSIS — Z515 Encounter for palliative care: Secondary | ICD-10-CM

## 2019-07-29 DIAGNOSIS — N1832 Chronic kidney disease, stage 3b: Secondary | ICD-10-CM | POA: Diagnosis present

## 2019-07-29 DIAGNOSIS — Z95810 Presence of automatic (implantable) cardiac defibrillator: Secondary | ICD-10-CM

## 2019-07-29 DIAGNOSIS — R0600 Dyspnea, unspecified: Secondary | ICD-10-CM

## 2019-07-29 DIAGNOSIS — K117 Disturbances of salivary secretion: Secondary | ICD-10-CM | POA: Diagnosis present

## 2019-07-29 DIAGNOSIS — F419 Anxiety disorder, unspecified: Secondary | ICD-10-CM | POA: Diagnosis present

## 2019-07-29 DIAGNOSIS — Z7951 Long term (current) use of inhaled steroids: Secondary | ICD-10-CM

## 2019-07-29 DIAGNOSIS — J9611 Chronic respiratory failure with hypoxia: Secondary | ICD-10-CM | POA: Diagnosis present

## 2019-07-29 DIAGNOSIS — I959 Hypotension, unspecified: Secondary | ICD-10-CM | POA: Diagnosis not present

## 2019-07-29 DIAGNOSIS — Z79899 Other long term (current) drug therapy: Secondary | ICD-10-CM

## 2019-07-29 DIAGNOSIS — Z7901 Long term (current) use of anticoagulants: Secondary | ICD-10-CM

## 2019-07-29 DIAGNOSIS — I5042 Chronic combined systolic (congestive) and diastolic (congestive) heart failure: Secondary | ICD-10-CM | POA: Diagnosis not present

## 2019-07-29 DIAGNOSIS — Z7189 Other specified counseling: Secondary | ICD-10-CM

## 2019-07-29 DIAGNOSIS — I209 Angina pectoris, unspecified: Secondary | ICD-10-CM

## 2019-07-29 DIAGNOSIS — I252 Old myocardial infarction: Secondary | ICD-10-CM

## 2019-07-29 LAB — CBC WITH DIFFERENTIAL/PLATELET
Abs Immature Granulocytes: 0.02 10*3/uL (ref 0.00–0.07)
Basophils Absolute: 0 10*3/uL (ref 0.0–0.1)
Basophils Relative: 1 %
Eosinophils Absolute: 0.1 10*3/uL (ref 0.0–0.5)
Eosinophils Relative: 2 %
HCT: 33.1 % — ABNORMAL LOW (ref 36.0–46.0)
Hemoglobin: 10.2 g/dL — ABNORMAL LOW (ref 12.0–15.0)
Immature Granulocytes: 0 %
Lymphocytes Relative: 13 %
Lymphs Abs: 0.8 10*3/uL (ref 0.7–4.0)
MCH: 28.7 pg (ref 26.0–34.0)
MCHC: 30.8 g/dL (ref 30.0–36.0)
MCV: 93.2 fL (ref 80.0–100.0)
Monocytes Absolute: 0.6 10*3/uL (ref 0.1–1.0)
Monocytes Relative: 10 %
Neutro Abs: 4.6 10*3/uL (ref 1.7–7.7)
Neutrophils Relative %: 74 %
Platelets: 240 10*3/uL (ref 150–400)
RBC: 3.55 MIL/uL — ABNORMAL LOW (ref 3.87–5.11)
RDW: 13.7 % (ref 11.5–15.5)
WBC: 6.2 10*3/uL (ref 4.0–10.5)
nRBC: 0 % (ref 0.0–0.2)

## 2019-07-29 LAB — COMPREHENSIVE METABOLIC PANEL
ALT: 10 U/L (ref 0–44)
AST: 13 U/L — ABNORMAL LOW (ref 15–41)
Albumin: 3.2 g/dL — ABNORMAL LOW (ref 3.5–5.0)
Alkaline Phosphatase: 93 U/L (ref 38–126)
Anion gap: 13 (ref 5–15)
BUN: 32 mg/dL — ABNORMAL HIGH (ref 8–23)
CO2: 30 mmol/L (ref 22–32)
Calcium: 9 mg/dL (ref 8.9–10.3)
Chloride: 99 mmol/L (ref 98–111)
Creatinine, Ser: 1.36 mg/dL — ABNORMAL HIGH (ref 0.44–1.00)
GFR calc Af Amer: 42 mL/min — ABNORMAL LOW (ref >60)
GFR calc non Af Amer: 36 mL/min — ABNORMAL LOW (ref >60)
Glucose, Bld: 143 mg/dL — ABNORMAL HIGH (ref 70–99)
Potassium: 3.3 mmol/L — ABNORMAL LOW (ref 3.5–5.1)
Sodium: 142 mmol/L (ref 135–145)
Total Bilirubin: 1.3 mg/dL — ABNORMAL HIGH (ref 0.3–1.2)
Total Protein: 6 g/dL — ABNORMAL LOW (ref 6.5–8.1)

## 2019-07-29 LAB — TROPONIN I (HIGH SENSITIVITY)
Troponin I (High Sensitivity): 15 ng/L (ref <18)
Troponin I (High Sensitivity): 14 ng/L (ref <18)

## 2019-07-29 LAB — BRAIN NATRIURETIC PEPTIDE: B Natriuretic Peptide: 480.2 pg/mL — ABNORMAL HIGH (ref 0.0–100.0)

## 2019-07-29 LAB — MAGNESIUM: Magnesium: 2.2 mg/dL (ref 1.7–2.4)

## 2019-07-29 LAB — SARS CORONAVIRUS 2 BY RT PCR (HOSPITAL ORDER, PERFORMED IN ~~LOC~~ HOSPITAL LAB): SARS Coronavirus 2: NEGATIVE

## 2019-07-29 MED ORDER — ONDANSETRON HCL 4 MG/2ML IJ SOLN: 4.0000 mg | Freq: Four times a day (QID) | INTRAMUSCULAR | Status: DC | PRN

## 2019-07-29 MED ORDER — CARVEDILOL 3.125 MG PO TABS
3.1250 mg | ORAL_TABLET | Freq: Two times a day (BID) | ORAL | Status: DC
  Administered 2019-07-30 – 2019-08-05 (×12): 3.125 mg via ORAL
  Filled 2019-07-29 (×15): qty 1

## 2019-07-29 MED ORDER — AZITHROMYCIN 250 MG PO TABS
250.0000 mg | ORAL_TABLET | Freq: Every day | ORAL | Status: DC
  Administered 2019-07-30: 250 mg via ORAL
  Filled 2019-07-29: qty 1

## 2019-07-29 MED ORDER — HEPARIN SODIUM (PORCINE) 5000 UNIT/ML IJ SOLN
5000.0000 [IU] | Freq: Two times a day (BID) | INTRAMUSCULAR | Status: DC
  Administered 2019-07-29 – 2019-07-31 (×5): 5000 [IU] via SUBCUTANEOUS
  Filled 2019-07-29 (×6): qty 1

## 2019-07-29 MED ORDER — GUAIFENESIN 100 MG/5ML PO SOLN
5.0000 mL | ORAL | Status: DC | PRN
  Filled 2019-07-29 (×2): qty 5

## 2019-07-29 MED ORDER — NYSTATIN-TRIAMCINOLONE 100000-0.1 UNIT/GM-% EX CREA
1.0000 "application " | TOPICAL_CREAM | Freq: Two times a day (BID) | CUTANEOUS | Status: DC | PRN
  Filled 2019-07-29: qty 15

## 2019-07-29 MED ORDER — FERROUS SULFATE 325 (65 FE) MG PO TABS
324.0000 mg | ORAL_TABLET | Freq: Every day | ORAL | Status: DC
  Administered 2019-07-30 – 2019-08-05 (×7): 324 mg via ORAL
  Filled 2019-07-29 (×7): qty 1

## 2019-07-29 MED ORDER — BUDESONIDE 0.5 MG/2ML IN SUSP
0.5000 mg | Freq: Two times a day (BID) | RESPIRATORY_TRACT | Status: DC
  Administered 2019-07-29 – 2019-08-05 (×14): 0.5 mg via RESPIRATORY_TRACT
  Filled 2019-07-29 (×16): qty 2

## 2019-07-29 MED ORDER — ALPRAZOLAM 0.5 MG PO TABS
0.5000 mg | ORAL_TABLET | Freq: Two times a day (BID) | ORAL | Status: DC | PRN
  Administered 2019-07-29 – 2019-07-30 (×2): 0.5 mg via ORAL
  Filled 2019-07-29 (×2): qty 1

## 2019-07-29 MED ORDER — PANTOPRAZOLE SODIUM 40 MG PO TBEC
40.0000 mg | DELAYED_RELEASE_TABLET | Freq: Every day | ORAL | Status: DC
  Administered 2019-07-30 – 2019-08-05 (×7): 40 mg via ORAL
  Filled 2019-07-29 (×7): qty 1

## 2019-07-29 MED ORDER — ENOXAPARIN SODIUM 40 MG/0.4ML ~~LOC~~ SOLN
40.0000 mg | SUBCUTANEOUS | Status: DC
Start: 2019-07-29 — End: 2019-07-29

## 2019-07-29 MED ORDER — NITROGLYCERIN 0.4 MG SL SUBL
0.4000 mg | SUBLINGUAL_TABLET | SUBLINGUAL | Status: DC | PRN
  Administered 2019-07-31 – 2019-08-02 (×3): 0.4 mg via SUBLINGUAL
  Filled 2019-07-29 (×2): qty 1

## 2019-07-29 MED ORDER — ONDANSETRON HCL 4 MG PO TABS: 4.0000 mg | ORAL_TABLET | Freq: Four times a day (QID) | ORAL | Status: DC | PRN

## 2019-07-29 MED ORDER — TIZANIDINE HCL 4 MG PO TABS
4.0000 mg | ORAL_TABLET | Freq: Three times a day (TID) | ORAL | Status: DC | PRN
  Administered 2019-07-29: 4 mg via ORAL
  Filled 2019-07-29 (×2): qty 1

## 2019-07-29 MED ORDER — VITAMIN D (ERGOCALCIFEROL) 1.25 MG (50000 UNIT) PO CAPS
50000.0000 [IU] | ORAL_CAPSULE | ORAL | Status: DC
  Administered 2019-08-02: 50000 [IU] via ORAL
  Filled 2019-07-29: qty 1

## 2019-07-29 MED ORDER — ALBUTEROL SULFATE (2.5 MG/3ML) 0.083% IN NEBU: 2.5000 mg | INHALATION_SOLUTION | Freq: Four times a day (QID) | RESPIRATORY_TRACT | Status: DC | PRN

## 2019-07-29 MED ORDER — RISAQUAD PO CAPS
1.0000 | ORAL_CAPSULE | Freq: Every day | ORAL | Status: DC
  Administered 2019-07-30 – 2019-08-05 (×7): 1 via ORAL
  Filled 2019-07-29 (×8): qty 1

## 2019-07-29 MED ORDER — DIPHENHYDRAMINE HCL 25 MG PO CAPS: 25.0000 mg | ORAL_CAPSULE | Freq: Four times a day (QID) | ORAL | Status: DC | PRN

## 2019-07-29 MED ORDER — FUROSEMIDE 80 MG PO TABS
80.0000 mg | ORAL_TABLET | Freq: Every morning | ORAL | Status: DC
  Administered 2019-07-30 – 2019-08-05 (×7): 80 mg via ORAL
  Filled 2019-07-29 (×7): qty 1

## 2019-07-29 MED ORDER — UMECLIDINIUM BROMIDE 62.5 MCG/INH IN AEPB
1.0000 | INHALATION_SPRAY | Freq: Every day | RESPIRATORY_TRACT | Status: DC
  Administered 2019-07-30 – 2019-08-05 (×7): 1 via RESPIRATORY_TRACT
  Filled 2019-07-29: qty 7

## 2019-07-29 MED ORDER — ALBUTEROL SULFATE (2.5 MG/3ML) 0.083% IN NEBU
2.5000 mg | INHALATION_SOLUTION | Freq: Four times a day (QID) | RESPIRATORY_TRACT | Status: DC
  Administered 2019-07-29: 2.5 mg via RESPIRATORY_TRACT
  Filled 2019-07-29: qty 3

## 2019-07-29 MED ORDER — AZITHROMYCIN 250 MG PO TABS
500.0000 mg | ORAL_TABLET | Freq: Every day | ORAL | Status: AC
  Administered 2019-07-29: 500 mg via ORAL
  Filled 2019-07-29: qty 2

## 2019-07-29 MED ORDER — FLUTICASONE FUROATE-VILANTEROL 100-25 MCG/INH IN AEPB
1.0000 | INHALATION_SPRAY | Freq: Every day | RESPIRATORY_TRACT | Status: DC
  Administered 2019-07-30 – 2019-08-05 (×7): 1 via RESPIRATORY_TRACT
  Filled 2019-07-29: qty 28

## 2019-07-29 MED ORDER — PREDNISONE 20 MG PO TABS
40.0000 mg | ORAL_TABLET | Freq: Every day | ORAL | Status: DC
  Administered 2019-07-30 – 2019-08-03 (×5): 40 mg via ORAL
  Filled 2019-07-29 (×5): qty 2

## 2019-07-29 MED ORDER — POTASSIUM CHLORIDE CRYS ER 20 MEQ PO TBCR
40.0000 meq | EXTENDED_RELEASE_TABLET | Freq: Once | ORAL | Status: AC
  Administered 2019-07-29: 40 meq via ORAL
  Filled 2019-07-29: qty 2

## 2019-07-29 MED ORDER — FUROSEMIDE 10 MG/ML IJ SOLN
40.0000 mg | Freq: Once | INTRAMUSCULAR | Status: AC
  Administered 2019-07-29: 40 mg via INTRAVENOUS
  Filled 2019-07-29: qty 4

## 2019-07-29 MED ORDER — ACETAMINOPHEN 500 MG PO TABS
500.0000 mg | ORAL_TABLET | Freq: Four times a day (QID) | ORAL | Status: DC | PRN
  Administered 2019-08-03: 500 mg via ORAL
  Filled 2019-07-29: qty 1

## 2019-07-29 MED ORDER — ASPIRIN EC 81 MG PO TBEC
81.0000 mg | DELAYED_RELEASE_TABLET | Freq: Every day | ORAL | Status: DC
  Administered 2019-07-30 – 2019-08-05 (×7): 81 mg via ORAL
  Filled 2019-07-29 (×7): qty 1

## 2019-07-29 MED ORDER — FLUTICASONE PROPIONATE 50 MCG/ACT NA SUSP
2.0000 | Freq: Every day | NASAL | Status: DC
  Administered 2019-07-30 – 2019-08-05 (×7): 2 via NASAL
  Filled 2019-07-29 (×2): qty 16

## 2019-07-29 NOTE — ED Triage Notes (Signed)
Pt BIB [facility] from home c/o chest pressure that she woke up with this morning. Pt took one nitroglycerin at home prior to EMS arrival. Pt given 324 of aspirin by EMS and an additional SL nitro. Per EMS patient's blood pressure went from 784 systolic to 88 systolic after the second nitroglycerin. Pt given 273mL of NSS for this. Pt has a pmh of previous MI and CABG. Pt arrives alert and oriented x4.

## 2019-07-29 NOTE — ED Provider Notes (Signed)
[facility] EMERGENCY DEPARTMENT Provider Note   CSN: [id number] Arrival date & time: 07/29/19  1200     History Chief Complaint  Patient presents with  . Chest Pain    Brandi Gardner is a 85 y.o. female.  HPI 85 year old female presents with chest pain.  She states she has chest pressure a lot but this morning it was more severe.  Was severe to the point that she called EMS.  They gave her 324 mg aspirin and 2 nitroglycerin.  Her chest pressure is now much more mild.  She also had acute on chronic shortness of breath that is also improved.  She was feeling a little clammy and had discomfort in her bilateral jaw and numbness in bilateral arms.  Chronically has leg swelling and states is not too bad today, mostly in her feet.  She feels like her chest is congested.  She had an MI in the 1980s and this felt similar.   Past Medical History:  Diagnosis Date  . Acute on chronic systolic (congestive) heart failure ([place]) 04/24/2015  . AICD (automatic cardioverter/defibrillator) present   . Anxiety   . Cardiac defibrillator in place 04/25/2015  . CHF (congestive heart failure) ([place])   . Chronic obstructive pulmonary disease with acute exacerbation ([place]) 04/22/2015  . COPD (chronic obstructive pulmonary disease) ([place])   . Coronary artery disease   . Hx of pulmonary embolus 1980  . Hyperlipidemia   . Hypertension   . Ischemic cardiomyopathy 05/24/2015  . S/P CABG (coronary artery bypass graft) 05/24/2015    Patient Active Problem List   Diagnosis Date Noted  . Acute exacerbation of CHF (congestive heart failure) ([place]) 06/27/2019  . Chronic respiratory failure with hypoxia ([place]) 06/27/2019  . NSVT (nonsustained ventricular tachycardia) ([place]) 06/27/2019  . COPD (chronic obstructive pulmonary disease) ([place]) 06/27/2019  . AKI (acute kidney injury) ([place]) 06/27/2019  . Acute respiratory failure with hypoxia and hypercapnia ([facility])   . Congestive heart failure (CHF) ([place])  08/26/2017  . CHF (congestive heart failure) ([place]) 08/26/2017  . Hyperkalemia 10/22/2016  . Mitral regurgitation 10/13/2016  . Hyperlipidemia   . Ischemic cardiomyopathy 05/24/2015  . S/P CABG (coronary artery bypass graft) 05/24/2015  . Cardiac defibrillator in place 04/25/2015  . Acute on chronic systolic (congestive) heart failure ([place]) 04/24/2015  . Chronic obstructive pulmonary disease with acute exacerbation ([place]) 04/22/2015  . Hx of pulmonary embolus 03/10/1978    Past Surgical History:  Procedure Laterality Date  . CARDIAC CATHETERIZATION    . CORONARY ARTERY BYPASS GRAFT    . IR GENERIC HISTORICAL  11/08/2015   IR RADIOLOGIST EVAL & MGMT 11/08/2015 [facility]  . IR GENERIC HISTORICAL  11/13/2015   IR VERTEBROPLASTY LUMBAR BX INC UNI/BIL INC/INJECT/IMAGING 11/13/2015 Luanne Bras, MD [facility]  . IR GENERIC HISTORICAL  12/07/2015   IR RADIOLOGIST EVAL & MGMT 12/07/2015 [facility]  . TUBAL LIGATION       OB History   No obstetric history on file.     Family History  Problem Relation Age of Onset  . Hypertension Mother   . Uterine cancer Mother   . Hypertension Father     Social History   Tobacco Use  . Smoking status: Former Smoker    Quit date: 11/12/1992    Years since quitting: 26.7  . Smokeless tobacco: Never Used  Substance Use Topics  . Alcohol use: No  . Drug use: No    Home Medications Prior to Admission medications  Medication Sig Start Date End Date Taking? Authorizing Provider  acetaminophen (TYLENOL) 500 MG tablet Take 500 mg by mouth every 6 (six) hours as needed for mild pain or headache.     [provider]  albuterol (PROAIR HFA) 108 (90 Base) MCG/ACT inhaler Inhale 2 puffs into the lungs every 6 (six) hours as needed for wheezing or shortness of breath.    [provider]  albuterol (PROVENTIL) (2.5 MG/3ML) 0.083% nebulizer solution Take 2.5 mg by nebulization in the morning and at bedtime.    [provider]  ALPRAZolam Duanne Moron) 1 MG tablet Take 1 tablet (1 mg total) by mouth 2 (two) times daily as needed for anxiety. Patient taking differently: Take 1 mg by mouth daily.  08/30/17   Guadalupe Dawn, MD  aspirin EC 81 MG tablet Take 81 mg by mouth daily.    [provider]  budesonide (PULMICORT) 0.5 MG/2ML nebulizer solution Take 0.5 mg by nebulization in the morning and at bedtime.    [provider]  carvedilol (COREG) 3.125 MG tablet Take 3.125 mg by mouth 2 (two) times daily with a meal.    [provider]  D3-50 50000 units capsule Take 50,000 Units by mouth every Tuesday.  08/21/17   [provider]  diphenhydrAMINE-PE-APAP (EQ ALLERGY & SINUS HEADACHE PO) Take 1 tablet by mouth every 6 (six) hours as needed (for sinus symptoms/congestion).     [provider]  ferrous sulfate 324 (65 Fe) MG TBEC Take 324 mg by mouth daily with breakfast.  06/09/17   [provider]  fluticasone (FLONASE) 50 MCG/ACT nasal spray Place 2 sprays into both nostrils daily.    [provider]  fluticasone furoate-vilanterol (BREO ELLIPTA) 100-25 MCG/INH AEPB Inhale 1 puff into the lungs daily.    [provider]  furosemide (LASIX) 40 MG tablet Take 2 tablets (80 mg total) by mouth in the morning. 07/01/19   Geradine Girt, DO  nitroGLYCERIN (NITROSTAT) 0.4 MG SL tablet Place 0.4 mg under the tongue every 5 (five) minutes as needed for chest pain.    [provider]  nystatin-triamcinolone (MYCOLOG II) cream Apply 1 application topically 2 (two) times daily as needed (for yeast).    [provider]  OXYGEN Inhale 2 L/min into the lungs continuous.    [provider]  pantoprazole (PROTONIX) 40 MG tablet Take 40 mg by mouth daily before breakfast.  06/09/17   [provider]  Probiotic CAPS Take 1 capsule by mouth daily.    [provider]  tiotropium (SPIRIVA) 18 MCG inhalation capsule Place 18 mcg into  inhaler and inhale daily.    [provider]  tiZANidine (ZANAFLEX) 4 MG tablet Take 4 mg by mouth every 8 (eight) hours as needed (for back pain).  08/21/17   [provider]    Allergies    Diphenhydramine, Doxycycline, Hydrocodone, Oxycodone, Penicillins, Potassium-containing compounds, Tramadol, Hydrocodone-acetaminophen, Other, Percocet [oxycodone-acetaminophen], Vicodin [hydrocodone-acetaminophen], Potassium, and Statins  Review of Systems   Review of Systems  Constitutional: Positive for diaphoresis. Negative for fever.  Respiratory: Positive for shortness of breath. Negative for cough.   Cardiovascular: Positive for chest pain and leg swelling.  Gastrointestinal: Positive for nausea. Negative for abdominal pain and vomiting.  Neurological: Positive for numbness.  All other systems reviewed and are negative.   Physical Exam Updated Vital Signs BP (!) 110/53   Pulse 75   Temp 97.8 F (36.6 C) (Oral)   Resp 20  Ht 5\' 4"  (1.626 m)   Wt 65.8 kg   SpO2 98%   BMI 24.89 kg/m   Physical Exam Vitals and nursing note reviewed.  Constitutional:      General: She is not in acute distress.    Appearance: She is well-developed. She is not ill-appearing or diaphoretic.  HENT:     Head: Normocephalic and atraumatic.     Right Ear: External ear normal.     Left Ear: External ear normal.     Nose: Nose normal.  Eyes:     General:        Right eye: No discharge.        Left eye: No discharge.  Cardiovascular:     Rate and Rhythm: Normal rate and regular rhythm.     Heart sounds: Normal heart sounds.  Pulmonary:     Effort: Pulmonary effort is normal.     Breath sounds: Wheezing and rales present.     Comments: Scattered mild wheezes and crackles, mild Abdominal:     Palpations: Abdomen is soft.     Tenderness: There is no abdominal tenderness.  Musculoskeletal:     Right lower leg: Edema present.     Left lower leg: Edema present.     Comments: Mild  edema in bilateral feet  Skin:    General: Skin is warm and dry.  Neurological:     Mental Status: She is alert.  Psychiatric:        Mood and Affect: Mood is not anxious.     ED Results / Procedures / Treatments   Labs (all labs ordered are listed, but only abnormal results are displayed) Labs Reviewed  COMPREHENSIVE METABOLIC PANEL - Abnormal; Notable for the following components:      Result Value   Potassium 3.3 (*)    Glucose, Bld 143 (*)    BUN 32 (*)    Creatinine, Ser 1.36 (*)    Total Protein 6.0 (*)    Albumin 3.2 (*)    AST 13 (*)    Total Bilirubin 1.3 (*)    GFR calc non Af Amer 36 (*)    GFR calc Af Amer 42 (*)    All other components within normal limits  CBC WITH DIFFERENTIAL/PLATELET - Abnormal; Notable for the following components:   RBC 3.55 (*)    Hemoglobin 10.2 (*)    HCT 33.1 (*)    All other components within normal limits  BRAIN NATRIURETIC PEPTIDE - Abnormal; Notable for the following components:   B Natriuretic Peptide 480.2 (*)    All other components within normal limits  TROPONIN I (HIGH SENSITIVITY)  TROPONIN I (HIGH SENSITIVITY)    EKG EKG Interpretation  Date/Time:  Friday Jul 29 2019 12:09:38 EDT Ventricular Rate:  74 PR Interval:    QRS Duration: 141 QT Interval:  448 QTC Calculation: 498 R Axis:   80 Text Interpretation: Sinus rhythm Probable left atrial enlargement Left ventricular hypertrophy Anterior infarct, old Confirmed by Sherwood Gambler [phone]) on 07/29/2019 1:30:27 PM   Radiology DG Chest 2 View  Result Date: 07/29/2019 CLINICAL DATA:  Chest pain and shortness of breath EXAM: CHEST - 2 VIEW COMPARISON:  June 27, 2019 FINDINGS: There is a small pleural effusion on the right. There is patchy airspace opacity in the right base. There is slight left base atelectasis. Heart size and pulmonary vascularity are within normal limits. Pacemaker lead is attached to the right ventricular apex. Patient is status post coronary  artery bypass grafting. No adenopathy. No bone lesions. IMPRESSION: Fairly small right pleural effusion with suspected superimposed pneumonia and atelectasis in the right base. Slight left base atelectasis. Left lung otherwise clear. Heart size within normal limits. Postoperative changes noted. No adenopathy evident. Electronically Signed   By: Lowella Grip III M.D.   On: 07/29/2019 12:57    Procedures Procedures (including critical care time)  Medications Ordered in ED Medications  furosemide (LASIX) injection 40 mg (has no administration in time range)    ED Course  I have reviewed the triage vital signs and the nursing notes.  Pertinent labs & imaging results that were available during my care of the patient were reviewed by me and considered in my medical decision making (see chart for details).    MDM Rules/Calculators/A&P HEAR Score: 7                    Initial troponin is negative.  She does appear to have some symptomatic CHF.  Chest pain has significantly improved and while it is not gone, she states she often has low level chest pain like she is having now.  Her heart score is a 7.  Given this I discussed with patient and we will observe overnight.  Chest x-ray personally reviewed and while radiology is calling possible pneumonia, this does not really fit clinically given no cough, fever, and normal WBC.  Dr. Roosevelt Locks to admit. Final Clinical Impression(s) / ED Diagnoses Final diagnoses:  Chest pain with high risk of acute coronary syndrome    Rx / DC Orders ED Discharge Orders    None       Sherwood Gambler, MD 07/29/19 1542

## 2019-07-29 NOTE — Consult Note (Addendum)
Cardiology Consultation:   Patient ID: Brandi Gardner MRN: [id number]; DOB: [birth year]  Admit date: 07/29/2019 Date of Consult: 07/29/2019  Primary Care Provider: Cyndi Bender, PA-C Primary Cardiologist: Mathis Bud, MD  Primary Electrophysiologist:  Edward Qualia, PA    Patient Profile:   Brandi Gardner is a 85 y.o. female with a hx of chronic systolic heart failure with EF 20% s/p MDT ICD 04/2015, VT & VF arrest 07/24/17 treated medically, CKD stage III, COPD on O2, CAD s/p CABG (2007), HTN, HLD, and hx of PE no AC who is being seen today for the evaluation of chest pain at the request of Dr. Roosevelt Locks.  History of Present Illness:   Brandi Gardner has complex cardiology history. She has a history of HFrEF with EF of 20% with MDT device in place. She had a VT/VFib arrest in 07/24/17 and was transferred from [facility] to [place] at that time could not exclude reversible ischemia, but renal function was reduced and angiography not pursued. She was treated medically. She has a history of PE but not on anticoagulation.  Echo 08/2018 with EF 25-30%. She was hospitalized 06/2019 for CHF. There was confusion about her medication regimen. Appears that she follows with Cardiology at [facility] at baseline but had not seen since last year. She has known ischemic cardiomyopathy with medication regimen below optimal levels. VQ scan ruled out PE. She was conservatively diuresed with reduced renal function and discharged on 07/01/19. She has chronic respiratory failure with chronic home O2. Echo during last admission with EF of 20-25%.   She presented to [facility] with chest pain and worsening dyspnea. On my interview, she describes somewhat chronic chest pain that has worsened over the past 2 days. She describes chest pressure across her entire chest that radiates to both jaws and around her rib cage on both side. The pressure has been constant. This has been associated with increased dyspnea. She has  chronic dyspnea that has worsened. She reports severe fatigue and "just want to sleep all of the time." She has not been vaccinated against COVID-19. She has had no sick contacts. She takes 80 mg lasix daily but reports very little urine output in the past few days - no burning.    Past Medical History:  Diagnosis Date  . Acute on chronic systolic (congestive) heart failure ([place]) 04/24/2015  . AICD (automatic cardioverter/defibrillator) present   . Anxiety   . Cardiac defibrillator in place 04/25/2015  . CHF (congestive heart failure) ([place])   . Chronic obstructive pulmonary disease with acute exacerbation ([place]) 04/22/2015  . COPD (chronic obstructive pulmonary disease) ([place])   . Coronary artery disease   . Hx of pulmonary embolus 1980  . Hyperlipidemia   . Hypertension   . Ischemic cardiomyopathy 05/24/2015  . S/P CABG (coronary artery bypass graft) 05/24/2015    Past Surgical History:  Procedure Laterality Date  . CARDIAC CATHETERIZATION    . CORONARY ARTERY BYPASS GRAFT    . IR GENERIC HISTORICAL  11/08/2015   IR RADIOLOGIST EVAL & MGMT 11/08/2015 [facility]  . IR GENERIC HISTORICAL  11/13/2015   IR VERTEBROPLASTY LUMBAR BX INC UNI/BIL INC/INJECT/IMAGING 11/13/2015 Luanne Bras, MD [facility]INTER[facility]  . IR GENERIC HISTORICAL  12/07/2015   IR RADIOLOGIST EVAL & MGMT 12/07/2015 [facility]  . TUBAL LIGATION       Home Medications:  Prior to Admission medications   Medication Sig Start Date End Date Taking? Authorizing Provider  carvedilol (COREG) 3.125 MG tablet Take 3.125  mg by mouth 2 (two) times daily with a meal.   Yes [provider]  furosemide (LASIX) 40 MG tablet Take 2 tablets (80 mg total) by mouth in the morning. 07/01/19  Yes Eulogio Bear U, DO  Probiotic CAPS Take 1 capsule by mouth daily.   Yes [provider]  acetaminophen (TYLENOL) 500 MG tablet Take 500 mg by mouth every 6 (six) hours as needed for mild pain or headache.     [provider]    albuterol (PROAIR HFA) 108 (90 Base) MCG/ACT inhaler Inhale 2 puffs into the lungs every 6 (six) hours as needed for wheezing or shortness of breath.    [provider]  albuterol (PROVENTIL) (2.5 MG/3ML) 0.083% nebulizer solution Take 2.5 mg by nebulization in the morning and at bedtime.    [provider]  ALPRAZolam Duanne Moron) 1 MG tablet Take 1 tablet (1 mg total) by mouth 2 (two) times daily as needed for anxiety. Patient taking differently: Take 1 mg by mouth daily.  08/30/17   Guadalupe Dawn, MD  aspirin EC 81 MG tablet Take 81 mg by mouth daily.    [provider]  azithromycin (ZITHROMAX) 250 MG tablet Take 250 mg by mouth as directed. 07/21/19   [provider]  budesonide (PULMICORT) 0.5 MG/2ML nebulizer solution Take 0.5 mg by nebulization in the morning and at bedtime.    [provider]  D3-50 50000 units capsule Take 50,000 Units by mouth every Tuesday.  08/21/17   [provider]  diphenhydrAMINE-PE-APAP (EQ ALLERGY & SINUS HEADACHE PO) Take 1 tablet by mouth every 6 (six) hours as needed (for sinus symptoms/congestion).     [provider]  ferrous sulfate 324 (65 Fe) MG TBEC Take 324 mg by mouth daily with breakfast.  06/09/17   [provider]  fluticasone (FLONASE) 50 MCG/ACT nasal spray Place 2 sprays into both nostrils daily.    [provider]  fluticasone furoate-vilanterol (BREO ELLIPTA) 100-25 MCG/INH AEPB Inhale 1 puff into the lungs daily.    [provider]  nitroGLYCERIN (NITROSTAT) 0.4 MG SL tablet Place 0.4 mg under the tongue every 5 (five) minutes as needed for chest pain.    [provider]  nystatin-triamcinolone (MYCOLOG II) cream Apply 1 application topically 2 (two) times daily as needed (for yeast).    [provider]  OXYGEN Inhale 2 L/min into the lungs continuous.    [provider]  pantoprazole (PROTONIX) 40 MG tablet Take 40 mg by mouth daily  before breakfast.  06/09/17   [provider]  permethrin (ELIMITE) 5 % cream Apply 1 application topically in the morning and at bedtime. 06/20/19   [provider]  sertraline (ZOLOFT) 50 MG tablet Take 50 mg by mouth daily. 07/21/19   [provider]  spironolactone (ALDACTONE) 25 MG tablet Take 25 mg by mouth daily. 07/20/19   [provider]  tiotropium (SPIRIVA) 18 MCG inhalation capsule Place 18 mcg into inhaler and inhale daily.    [provider]  tiZANidine (ZANAFLEX) 4 MG tablet Take 4 mg by mouth every 8 (eight) hours as needed (for back pain).  08/21/17   [provider]    Inpatient Medications: Scheduled Meds: . albuterol  2.5 mg Nebulization Q6H  . aspirin EC  81 mg Oral Daily  . azithromycin  500 mg Oral Daily   Followed by  . [START ON 07/30/2019] azithromycin  250 mg Oral Daily  . budesonide  0.5  mg Nebulization BID  . carvedilol  3.125 mg Oral BID WC  . [START ON 08/02/2019] Cholecalciferol  50,000 Units Oral Q Tue  . [START ON 07/30/2019] ferrous sulfate  324 mg Oral Q breakfast  . fluticasone  2 spray Each Nare Daily  . fluticasone furoate-vilanterol  1 puff Inhalation Daily  . [START ON 07/30/2019] furosemide  80 mg Oral q AM  . heparin injection (subcutaneous)  5,000 Units Subcutaneous Q12H  . [START ON 07/30/2019] pantoprazole  40 mg Oral QAC breakfast  . potassium chloride  40 mEq Oral Once  . [START ON 07/30/2019] predniSONE  40 mg Oral Q breakfast  . Probiotic  1 capsule Oral Daily  . tiotropium  18 mcg Inhalation Daily   Continuous Infusions:  PRN Meds: acetaminophen, albuterol, ALPRAZolam, diphenhydrAMINE-PE-APAP, guaiFENesin, nitroGLYCERIN, nystatin-triamcinolone, ondansetron **OR** ondansetron (ZOFRAN) IV, tiZANidine  Allergies:    Allergies  Allergen Reactions  . Diphenhydramine Shortness Of Breath, Rash and Other (See Comments)    Has COPD, also   . Doxycycline Itching  . Hydrocodone Shortness Of  Breath, Itching and Rash  . Oxycodone Hives, Swelling and Other (See Comments)    Percocet = Throat Swelling caused her to go to ER     . Penicillins Other (See Comments)    Headache, childhood allergy   . Potassium-Containing Compounds Other (See Comments)    Mouth/jaw pain  . Tramadol Itching, Swelling, Rash and Other (See Comments)    Rash and Throat swelling caused her to go to ER   . Hydrocodone-Acetaminophen Hives  . Other Rash and Other (See Comments)    Oxygen tubing- BREAKS OUT THE SKIN IN RED, RAISED PATCHES  . Percocet [Oxycodone-Acetaminophen] Hives  . Vicodin [Hydrocodone-Acetaminophen] Hives  . Potassium Other (See Comments)    Pain in the mouth/jaws  . Statins Nausea Only    Social History:   Social History   Socioeconomic History  . Marital status: Widowed    Spouse name: Not on file  . Number of children: Not on file  . Years of education: Not on file  . Highest education level: Not on file  Occupational History  . Not on file  Tobacco Use  . Smoking status: Former Smoker    Quit date: 11/12/1992    Years since quitting: 26.7  . Smokeless tobacco: Never Used  Substance and Sexual Activity  . Alcohol use: No  . Drug use: No  . Sexual activity: Not on file  Other Topics Concern  . Not on file  Social History Narrative  . Not on file   Social Determinants of Health   Financial Resource Strain:   . Difficulty of Paying Living Expenses:   Food Insecurity:   . Worried About Charity fundraiser in the Last Year:   . Arboriculturist in the Last Year:   Transportation Needs:   . Film/video editor (Medical):   Marland Kitchen Lack of Transportation (Non-Medical):   Physical Activity:   . Days of Exercise per Week:   . Minutes of Exercise per Session:   Stress:   . Feeling of Stress :   Social Connections:   . Frequency of Communication with Friends and Family:   . Frequency of Social Gatherings with Friends and Family:   . Attends Religious Services:   .  Active Member of Clubs or Organizations:   . Attends Archivist Meetings:   Marland Kitchen Marital Status:   Intimate Partner Violence:   . Fear of Current  or Ex-Partner:   . Emotionally Abused:   Marland Kitchen Physically Abused:   . Sexually Abused:     Family History:    Family History  Problem Relation Age of Onset  . Hypertension Mother   . Uterine cancer Mother   . Hypertension Father      ROS:  Please see the history of present illness.   All other ROS reviewed and negative.     Physical Exam/Data:   Vitals:   07/29/19 1208 07/29/19 1209 07/29/19 1400 07/29/19 1530  BP: (!) 110/53  (!) 128/54 (!) 113/54  Pulse: 75  71 64  Resp: 20  18 17   Temp: 97.8 F (36.6 C)     TempSrc: Oral     SpO2: 98%  100% 100%  Weight:  65.8 kg    Height:  5\' 4"  (1.626 m)     No intake or output data in the 24 hours ending 07/29/19 1602 Last 3 Weights 07/29/2019 06/27/2019 08/30/2017  Weight (lbs) 145 lb 148 lb 125 lb 1.6 oz  Weight (kg) 65.772 kg 67.132 kg 56.745 kg     Body mass index is 24.89 kg/m.  General:  Elderly female HEENT: normal Neck: no JVD Vascular: No carotid bruits Cardiac:  normal S1, S2; RRR; no murmur Lungs:  diminished in right base, I do not appreciate wheezing or crackles in bases  Abd: soft, nontender, no hepatomegaly  Ext: no edema Musculoskeletal:  No deformities, BUE and BLE strength normal and equal Skin: warm and dry  Neuro:  CNs 2-12 intact, no focal abnormalities noted Psych:  Normal affect   EKG:  The EKG was personally reviewed and demonstrates:  Sinus rhythm HR 74, LBBB, old anterior infarct Telemetry:  Telemetry was personally reviewed and demonstrates:  Sinus rhythm in the 70s  Relevant CV Studies:  Echo 06/28/19: 1. Severe global reduction in LV systolic function; severe LVE; grade 1  diastolic dysfunction; mild to moderate MR; moderate LAE.  2. Left ventricular ejection fraction, by estimation, is 20 to 25%. The  left ventricle has severely decreased  function. The left ventricle  demonstrates global hypokinesis. The left ventricular internal cavity size  was severely dilated. Left ventricular  diastolic parameters are consistent with Grade I diastolic dysfunction  (impaired relaxation). Elevated left atrial pressure.  3. Right ventricular systolic function is normal. The right ventricular  size is normal.  4. Left atrial size was moderately dilated.  5. The mitral valve is normal in structure. Mild to moderate mitral valve  regurgitation. No evidence of mitral stenosis.  6. The aortic valve was not well visualized. Aortic valve regurgitation  is not visualized. No aortic stenosis is present.  7. The inferior vena cava is normal in size with greater than 50%  respiratory variability, suggesting right atrial pressure of 3 mmHg.   Laboratory Data:  High Sensitivity Troponin:   Recent Labs  Lab 07/29/19 1221 07/29/19 1421  TROPONINIHS 15 14     Chemistry Recent Labs  Lab 07/29/19 1221  NA 142  K 3.3*  CL 99  CO2 30  GLUCOSE 143*  BUN 32*  CREATININE 1.36*  CALCIUM 9.0  GFRNONAA 36*  GFRAA 42*  ANIONGAP 13    Recent Labs  Lab 07/29/19 1221  PROT 6.0*  ALBUMIN 3.2*  AST 13*  ALT 10  ALKPHOS 93  BILITOT 1.3*   Hematology Recent Labs  Lab 07/29/19 1221  WBC 6.2  RBC 3.55*  HGB 10.2*  HCT 33.1*  MCV 93.2  MCH 28.7  MCHC 30.8  RDW 13.7  PLT 240   BNP Recent Labs  Lab 07/29/19 1221  BNP 480.2*    DDimer No results for input(s): DDIMER in the last 168 hours.   Radiology/Studies:  DG Chest 2 View  Result Date: 07/29/2019 CLINICAL DATA:  Chest pain and shortness of breath EXAM: CHEST - 2 VIEW COMPARISON:  June 27, 2019 FINDINGS: There is a small pleural effusion on the right. There is patchy airspace opacity in the right base. There is slight left base atelectasis. Heart size and pulmonary vascularity are within normal limits. Pacemaker lead is attached to the right ventricular apex. Patient is  status post coronary artery bypass grafting. No adenopathy. No bone lesions. IMPRESSION: Fairly small right pleural effusion with suspected superimposed pneumonia and atelectasis in the right base. Slight left base atelectasis. Left lung otherwise clear. Heart size within normal limits. Postoperative changes noted. No adenopathy evident. Electronically Signed   By: Lowella Grip III M.D.   On: 07/29/2019 12:57       HEAR Score (for undifferentiated chest pain):  HEAR Score: 6    Assessment and Plan:   Worsening dyspnea Hx of ischemic cardiomyopathy, ICD in place (no recent shocks) Chronic systolic heart failure Mild diastolic dysfunction Suspected PNA on CXR Chronic respiratory failure on chronic home O2 - recent EF was 20-25% - BNP 480 with a sCr 1.36, K 3.3 - low albumin and protein not helping - CXR small right pleural effusion with suspected superimposed right sided PNA - maintained on coreg 3.125 mg BID, 80 mg lasix, 25 mg spironolactone - would continue current regimen above - appears largely euvolemic on exam - would hold off on IV diuresis for now - pt afebrile and no leukocytosis, will defer to primary team for PNA treatment   Chest pain CAD s/p CABG (2007) - hs troponin 15 --> 14 - chest pain with typical and atypical features - has been constant for 2 days, not pleuritic - continue ASA, medications as above - atypical CP in a patient with known CAD, but co-morbid conditions, including CKD stage III - will defer invasive testing in the setting of negative troponins - consider adding 30 mg imdur to see if this helps with her chest discomfort   CKD stage III - sCr 1.36, K 3.3 - will avoid contrast if possible - per primary   New urinary retention - pt is not voiding well in the setting of her home lasix - this is new in the past 2 days - recommend UA/urine culture   Hypokalemia - 3.3 - replace per primary     For questions or updates, please contact [facility] Please consult [email or web address] for contact info under     Signed, Ledora Bottcher, PA  07/29/2019 4:02 PM   Personally seen and examined. Agree with above.   85 year old with extensive cardiac history including bypass and redo bypass with VF arrest in 2019 ICD COPD on home O2 with prior history of pulmonary embolism on anticoagulation seen today for the evaluation of chest pain.  In talking to her, it is hard for her to describe completely the chest discomfort that she has been having.  Feels like "all over pain ".  Been quite persistent for quite some time.  Was having some wheezing.  Mild shortness of breath.  She is currently feeling better.  GEN: Elderly well nourished, well developed, in no acute distress  HEENT: normal  Neck: no JVD, carotid bruits,  or masses Cardiac: RRR; no murmurs, rubs, or gallops,no edema , CABG scar Respiratory:  clear to auscultation bilaterally, normal work of breathing, no significant wheezing GI: soft, nontender, nondistended, + BS MS: no deformity or atrophy  Skin: warm and dry, no rash Neuro:  Alert and Oriented x 3, Strength and sensation are intact Psych: euthymic mood, full affect  Troponins are flat, normal EKG with left bundle branch block personally reviewed.  No changes. Echo with EF 25 to 30%.  Assessment and plan:  Atypical chest pain -Reassuringly, she has normal troponins.  No new ischemic changes on ECG.  Her symptoms seem to be abating.  It is possible that her underlying COPD is the cause for some of her symptoms or perhaps her chest x-ray finding of suspected superimposed right-sided pneumonia at base. -Nonetheless she does have significant coronary artery disease, prior bypass surgery.  I do not think at this time that her coronary artery disease is playing a role in her discomfort.  I would continue with conservative management.  No further cardiac testing at this time.  She is amenable to this. -Could try low-dose  isosorbide 15 mg a day however I am unsure that this would change her symptomatology. -Continue with Coreg Lasix and spironolactone.  Appears euvolemic. -Consider treatment for possible infiltrate  CAD post CABG in 2007 -Flat troponin 15/14, constant for 2 days.  Appears noncardiac.  CKD 3 -Avoid NSAIDs, contrast  Difficulty urinating -Encourage urinalysis.  Candee Furbish, MD

## 2019-07-29 NOTE — ED Notes (Signed)
Pt went to x-ray.

## 2019-07-29 NOTE — ED Notes (Signed)
Attempted to call report

## 2019-07-29 NOTE — H&P (Signed)
History and Physical    Brandi Braddock IWP:[id number] DOB: Nov 02, 1934 DOA: 07/29/2019  PCP: Cyndi Bender, PA-C   Patient coming from: Home  I have personally briefly reviewed patient's old medical records in [place]  Chief Complaint: Chest pain  HPI: Brandi Gardner is a 85 y.o. female with medical history significant of chronic systolic CHF status post AICD, CAD status post CABG, COPD, chronic respiratory failure oncontinuous2 L home oxygen, history of PE currently not on anticoagulation, hypertension, hyperlipidemia, anxiety presented with new onset chest pain and worsening of breathing symptoms.  Her symptoms started about 1 week ago, started to have wheezing, she feels like she has chest congestion but cannot cough up anything, denies any fever chills.  For the last 3-4 morning she started to have episodic chest pains, usually happened after she woke up, localized midsternal, last for a few minutes, sometimes associated with worsening breathing symptoms.  This morning the pain radiated to bilateral fingertips and she feels clammy which resembles similar chest pain she had years ago when she had her attacks.  Chest pain last 20 to 30 minutes, resolved after given nitroglycerin.  Her last stress test/cath was in 2007. ED Course: Troponin II sets are negative, EKG no ST-T changes, x-ray shows increasing right-sided pleural effusion. Review of Systems: As per HPI otherwise 10 point review of systems negative.    Past Medical History:  Diagnosis Date  . Acute on chronic systolic (congestive) heart failure ([place]) 04/24/2015  . AICD (automatic cardioverter/defibrillator) present   . Anxiety   . Cardiac defibrillator in place 04/25/2015  . CHF (congestive heart failure) ([place])   . Chronic obstructive pulmonary disease with acute exacerbation ([place]) 04/22/2015  . COPD (chronic obstructive pulmonary disease) ([place])   . Coronary artery disease   . Hx of pulmonary embolus 1980  .  Hyperlipidemia   . Hypertension   . Ischemic cardiomyopathy 05/24/2015  . S/P CABG (coronary artery bypass graft) 05/24/2015    Past Surgical History:  Procedure Laterality Date  . CARDIAC CATHETERIZATION    . CORONARY ARTERY BYPASS GRAFT    . IR GENERIC HISTORICAL  11/08/2015   IR RADIOLOGIST EVAL & MGMT 11/08/2015 [facility]  . IR GENERIC HISTORICAL  11/13/2015   IR VERTEBROPLASTY LUMBAR BX INC UNI/BIL INC/INJECT/IMAGING 11/13/2015 Luanne Bras, MD [facility]  . IR GENERIC HISTORICAL  12/07/2015   IR RADIOLOGIST EVAL & MGMT 12/07/2015 [facility]  . TUBAL LIGATION       reports that she quit smoking about 26 years ago. She has never used smokeless tobacco. She reports that she does not drink alcohol or use drugs.  Allergies  Allergen Reactions  . Diphenhydramine Shortness Of Breath, Rash and Other (See Comments)    Has COPD, also   . Doxycycline Itching  . Hydrocodone Shortness Of Breath, Itching and Rash  . Oxycodone Hives, Swelling and Other (See Comments)    Percocet = Throat Swelling caused her to go to ER     . Penicillins Other (See Comments)    Headache, childhood allergy   . Potassium-Containing Compounds Other (See Comments)    Mouth/jaw pain  . Tramadol Itching, Swelling, Rash and Other (See Comments)    Rash and Throat swelling caused her to go to ER   . Hydrocodone-Acetaminophen Hives  . Other Rash and Other (See Comments)    Oxygen tubing- BREAKS OUT THE SKIN IN RED, RAISED PATCHES  . Percocet [Oxycodone-Acetaminophen] Hives  . Vicodin [Hydrocodone-Acetaminophen] Hives  . Potassium Other (  See Comments)    Pain in the mouth/jaws  . Statins Nausea Only    Family History  Problem Relation Age of Onset  . Hypertension Mother   . Uterine cancer Mother   . Hypertension Father      Prior to Admission medications   Medication Sig Start Date End Date Taking? Authorizing Provider  carvedilol (COREG) 3.125 MG tablet Take 3.125 mg by mouth 2 (two)  times daily with a meal.   Yes [provider]  furosemide (LASIX) 40 MG tablet Take 2 tablets (80 mg total) by mouth in the morning. 07/01/19  Yes Eulogio Bear U, DO  Probiotic CAPS Take 1 capsule by mouth daily.   Yes [provider]  acetaminophen (TYLENOL) 500 MG tablet Take 500 mg by mouth every 6 (six) hours as needed for mild pain or headache.     [provider]  albuterol (PROAIR HFA) 108 (90 Base) MCG/ACT inhaler Inhale 2 puffs into the lungs every 6 (six) hours as needed for wheezing or shortness of breath.    [provider]  albuterol (PROVENTIL) (2.5 MG/3ML) 0.083% nebulizer solution Take 2.5 mg by nebulization in the morning and at bedtime.    [provider]  ALPRAZolam Duanne Moron) 1 MG tablet Take 1 tablet (1 mg total) by mouth 2 (two) times daily as needed for anxiety. Patient taking differently: Take 1 mg by mouth daily.  08/30/17   Guadalupe Dawn, MD  aspirin EC 81 MG tablet Take 81 mg by mouth daily.    [provider]  azithromycin (ZITHROMAX) 250 MG tablet Take 250 mg by mouth as directed. 07/21/19   [provider]  budesonide (PULMICORT) 0.5 MG/2ML nebulizer solution Take 0.5 mg by nebulization in the morning and at bedtime.    [provider]  D3-50 50000 units capsule Take 50,000 Units by mouth every Tuesday.  08/21/17   [provider]  diphenhydrAMINE-PE-APAP (EQ ALLERGY & SINUS HEADACHE PO) Take 1 tablet by mouth every 6 (six) hours as needed (for sinus symptoms/congestion).     [provider]  ferrous sulfate 324 (65 Fe) MG TBEC Take 324 mg by mouth daily with breakfast.  06/09/17   [provider]  fluticasone (FLONASE) 50 MCG/ACT nasal spray Place 2 sprays into both nostrils daily.    [provider]  fluticasone furoate-vilanterol (BREO ELLIPTA) 100-25 MCG/INH AEPB Inhale 1 puff into the lungs daily.    [provider]  nitroGLYCERIN (NITROSTAT) 0.4 MG SL  tablet Place 0.4 mg under the tongue every 5 (five) minutes as needed for chest pain.    [provider]  nystatin-triamcinolone (MYCOLOG II) cream Apply 1 application topically 2 (two) times daily as needed (for yeast).    [provider]  OXYGEN Inhale 2 L/min into the lungs continuous.    [provider]  pantoprazole (PROTONIX) 40 MG tablet Take 40 mg by mouth daily before breakfast.  06/09/17   [provider]  permethrin (ELIMITE) 5 % cream Apply 1 application topically in the morning and at bedtime. 06/20/19   [provider]  sertraline (ZOLOFT) 50 MG tablet Take 50 mg by mouth daily. 07/21/19   [provider]  spironolactone (ALDACTONE) 25 MG tablet Take 25 mg by mouth daily. 07/20/19   [provider]  tiotropium (SPIRIVA) 18 MCG inhalation capsule Place 18 mcg into inhaler and inhale daily.    [provider]  tiZANidine (ZANAFLEX) 4 MG tablet Take 4 mg by mouth  every 8 (eight) hours as needed (for back pain).  08/21/17   [provider]    Physical Exam: Vitals:   07/29/19 1208 07/29/19 1209 07/29/19 1400  BP: (!) 110/53  (!) 128/54  Pulse: 75  71  Resp: 20  18  Temp: 97.8 F (36.6 C)    TempSrc: Oral    SpO2: 98%  100%  Weight:  65.8 kg   Height:  5\' 4"  (1.626 m)     Constitutional: NAD, calm, comfortable Vitals:   07/29/19 1208 07/29/19 1209 07/29/19 1400  BP: (!) 110/53  (!) 128/54  Pulse: 75  71  Resp: 20  18  Temp: 97.8 F (36.6 C)    TempSrc: Oral    SpO2: 98%  100%  Weight:  65.8 kg   Height:  5\' 4"  (1.626 m)    Eyes: PERRL, lids and conjunctivae normal ENMT: Mucous membranes are moist. Posterior pharynx clear of any exudate or lesions.Normal dentition.  Neck: normal, supple, no masses, no thyromegaly Respiratory: Diminished breathing sound bilaterally, scattered wheezing, no crackles. Normal respiratory effort. No accessory muscle use.  Cardiovascular: Regular rate and rhythm, no  murmurs / rubs / gallops. No extremity edema. 2+ pedal pulses. No carotid bruits.  Abdomen: no tenderness, no masses palpated. No hepatosplenomegaly. Bowel sounds positive.  Musculoskeletal: no clubbing / cyanosis. No joint deformity upper and lower extremities. Good ROM, no contractures. Normal muscle tone.  Skin: no rashes, lesions, ulcers. No induration Neurologic: CN 2-12 grossly intact. Sensation intact, DTR normal. Strength 5/5 in all 4.  Psychiatric: Normal judgment and insight. Alert and oriented x 3. Normal mood.     Labs on Admission: I have personally reviewed following labs and imaging studies  CBC: Recent Labs  Lab 07/29/19 1221  WBC 6.2  NEUTROABS 4.6  HGB 10.2*  HCT 33.1*  MCV 93.2  PLT 527   Basic Metabolic Panel: Recent Labs  Lab 07/29/19 1221  NA 142  K 3.3*  CL 99  CO2 30  GLUCOSE 143*  BUN 32*  CREATININE 1.36*  CALCIUM 9.0   GFR: Estimated Creatinine Clearance: 29.8 mL/min (A) (by C-G formula based on SCr of 1.36 mg/dL (H)). Liver Function Tests: Recent Labs  Lab 07/29/19 1221  AST 13*  ALT 10  ALKPHOS 93  BILITOT 1.3*  PROT 6.0*  ALBUMIN 3.2*   No results for input(s): LIPASE, AMYLASE in the last 168 hours. No results for input(s): AMMONIA in the last 168 hours. Coagulation Profile: No results for input(s): INR, PROTIME in the last 168 hours. Cardiac Enzymes: No results for input(s): CKTOTAL, CKMB, CKMBINDEX, TROPONINI in the last 168 hours. BNP (last 3 results) No results for input(s): PROBNP in the last 8760 hours. HbA1C: No results for input(s): HGBA1C in the last 72 hours. CBG: No results for input(s): GLUCAP in the last 168 hours. Lipid Profile: No results for input(s): CHOL, HDL, LDLCALC, TRIG, CHOLHDL, LDLDIRECT in the last 72 hours. Thyroid Function Tests: No results for input(s): TSH, T4TOTAL, FREET4, T3FREE, THYROIDAB in the last 72 hours. Anemia Panel: No results for input(s): VITAMINB12, FOLATE, FERRITIN, TIBC, IRON,  RETICCTPCT in the last 72 hours. Urine analysis:    Component Value Date/Time   COLORURINE STRAW (A) 06/27/2019 2333   APPEARANCEUR CLEAR 06/27/2019 2333   LABSPEC 1.006 06/27/2019 2333   PHURINE 5.0 06/27/2019 [place] 06/27/2019 2333   HGBUR NEGATIVE 06/27/2019 [place] NEGATIVE 06/27/2019 2333   KETONESUR NEGATIVE 06/27/2019 2333  PROTEINUR NEGATIVE 06/27/2019 2333   NITRITE NEGATIVE 06/27/2019 2333   LEUKOCYTESUR NEGATIVE 06/27/2019 2333    Radiological Exams on Admission: DG Chest 2 View  Result Date: 07/29/2019 CLINICAL DATA:  Chest pain and shortness of breath EXAM: CHEST - 2 VIEW COMPARISON:  June 27, 2019 FINDINGS: There is a small pleural effusion on the right. There is patchy airspace opacity in the right base. There is slight left base atelectasis. Heart size and pulmonary vascularity are within normal limits. Pacemaker lead is attached to the right ventricular apex. Patient is status post coronary artery bypass grafting. No adenopathy. No bone lesions. IMPRESSION: Fairly small right pleural effusion with suspected superimposed pneumonia and atelectasis in the right base. Slight left base atelectasis. Left lung otherwise clear. Heart size within normal limits. Postoperative changes noted. No adenopathy evident. Electronically Signed   By: Lowella Grip III M.D.   On: 07/29/2019 12:57    EKG: Independently reviewed.  Sinus rhythm, no acute ST-T changes  Assessment/Plan Active Problems:   Angina pectoris (HCC)   Chest pain  Chest pain, angina like rule out ACS -Trigger likely is her COPD exacerbation, however her pulse ox shows oxygenation 100%, and feature very much like an MRI she experienced in the past.  Discussed with cardiologist, who will see the patient and give recommendations about stress test versus other work-ups -Continue aspirin and statin -Her echo is up-to-date  Acute COPD exacerbation -She is already on S ABA, LABA, LAMA,  and inhaled steroid, will add short course of p.o. Zithromax and p.o. steroid -Other supportive medications  CKD stage III -Creatinine level stable, clinically she is euvolemic, continue Lasix  Chronic CHF systolic -Slight worsening of right-sided pleural effusion, but given her blood pressure, will not escalate her diuresis    DVT prophylaxis: Heparin subcu Code Status: Full code Family Communication: None at bedside Disposition Plan: Discharge barrier, depends on cardiac work-up, at least looks like need a stress test inpatient versus outpatient Consults called: Cardiology Admission status: Telemetry observation, likely can be discharged home in next 24 hours   Lequita Halt MD [facility] Pager [phone]    07/29/2019, 3:13 PM

## 2019-07-30 ENCOUNTER — Observation Stay (HOSPITAL_COMMUNITY): Payer: Medicare Other

## 2019-07-30 ENCOUNTER — Encounter (HOSPITAL_COMMUNITY): Payer: Self-pay | Admitting: Internal Medicine

## 2019-07-30 DIAGNOSIS — R0789 Other chest pain: Secondary | ICD-10-CM | POA: Diagnosis not present

## 2019-07-30 DIAGNOSIS — Z87891 Personal history of nicotine dependence: Secondary | ICD-10-CM | POA: Diagnosis not present

## 2019-07-30 DIAGNOSIS — N1832 Chronic kidney disease, stage 3b: Secondary | ICD-10-CM | POA: Diagnosis present

## 2019-07-30 DIAGNOSIS — R079 Chest pain, unspecified: Secondary | ICD-10-CM | POA: Diagnosis present

## 2019-07-30 DIAGNOSIS — Z951 Presence of aortocoronary bypass graft: Secondary | ICD-10-CM | POA: Diagnosis not present

## 2019-07-30 DIAGNOSIS — J441 Chronic obstructive pulmonary disease with (acute) exacerbation: Secondary | ICD-10-CM | POA: Diagnosis present

## 2019-07-30 DIAGNOSIS — R0600 Dyspnea, unspecified: Secondary | ICD-10-CM | POA: Diagnosis not present

## 2019-07-30 DIAGNOSIS — E785 Hyperlipidemia, unspecified: Secondary | ICD-10-CM | POA: Diagnosis present

## 2019-07-30 DIAGNOSIS — I130 Hypertensive heart and chronic kidney disease with heart failure and stage 1 through stage 4 chronic kidney disease, or unspecified chronic kidney disease: Secondary | ICD-10-CM | POA: Diagnosis present

## 2019-07-30 DIAGNOSIS — I252 Old myocardial infarction: Secondary | ICD-10-CM | POA: Diagnosis not present

## 2019-07-30 DIAGNOSIS — R918 Other nonspecific abnormal finding of lung field: Secondary | ICD-10-CM | POA: Diagnosis not present

## 2019-07-30 DIAGNOSIS — Z9981 Dependence on supplemental oxygen: Secondary | ICD-10-CM | POA: Diagnosis not present

## 2019-07-30 DIAGNOSIS — Z7901 Long term (current) use of anticoagulants: Secondary | ICD-10-CM | POA: Diagnosis not present

## 2019-07-30 DIAGNOSIS — Z515 Encounter for palliative care: Secondary | ICD-10-CM | POA: Diagnosis not present

## 2019-07-30 DIAGNOSIS — I25119 Atherosclerotic heart disease of native coronary artery with unspecified angina pectoris: Secondary | ICD-10-CM | POA: Diagnosis present

## 2019-07-30 DIAGNOSIS — I509 Heart failure, unspecified: Secondary | ICD-10-CM | POA: Diagnosis not present

## 2019-07-30 DIAGNOSIS — Z7982 Long term (current) use of aspirin: Secondary | ICD-10-CM | POA: Diagnosis not present

## 2019-07-30 DIAGNOSIS — F419 Anxiety disorder, unspecified: Secondary | ICD-10-CM | POA: Diagnosis present

## 2019-07-30 DIAGNOSIS — I5042 Chronic combined systolic (congestive) and diastolic (congestive) heart failure: Secondary | ICD-10-CM

## 2019-07-30 DIAGNOSIS — Z7189 Other specified counseling: Secondary | ICD-10-CM | POA: Diagnosis not present

## 2019-07-30 DIAGNOSIS — E876 Hypokalemia: Secondary | ICD-10-CM | POA: Diagnosis present

## 2019-07-30 DIAGNOSIS — I209 Angina pectoris, unspecified: Secondary | ICD-10-CM | POA: Diagnosis not present

## 2019-07-30 DIAGNOSIS — J9611 Chronic respiratory failure with hypoxia: Secondary | ICD-10-CM | POA: Diagnosis present

## 2019-07-30 DIAGNOSIS — Z888 Allergy status to other drugs, medicaments and biological substances status: Secondary | ICD-10-CM | POA: Diagnosis not present

## 2019-07-30 DIAGNOSIS — Z7951 Long term (current) use of inhaled steroids: Secondary | ICD-10-CM | POA: Diagnosis not present

## 2019-07-30 DIAGNOSIS — R339 Retention of urine, unspecified: Secondary | ICD-10-CM | POA: Diagnosis present

## 2019-07-30 DIAGNOSIS — Z86711 Personal history of pulmonary embolism: Secondary | ICD-10-CM | POA: Diagnosis not present

## 2019-07-30 DIAGNOSIS — R0602 Shortness of breath: Secondary | ICD-10-CM | POA: Diagnosis not present

## 2019-07-30 DIAGNOSIS — K117 Disturbances of salivary secretion: Secondary | ICD-10-CM | POA: Diagnosis present

## 2019-07-30 DIAGNOSIS — I255 Ischemic cardiomyopathy: Secondary | ICD-10-CM | POA: Diagnosis present

## 2019-07-30 DIAGNOSIS — Z20822 Contact with and (suspected) exposure to covid-19: Secondary | ICD-10-CM | POA: Diagnosis present

## 2019-07-30 DIAGNOSIS — Z79899 Other long term (current) drug therapy: Secondary | ICD-10-CM | POA: Diagnosis not present

## 2019-07-30 DIAGNOSIS — J90 Pleural effusion, not elsewhere classified: Secondary | ICD-10-CM | POA: Diagnosis not present

## 2019-07-30 DIAGNOSIS — I5022 Chronic systolic (congestive) heart failure: Secondary | ICD-10-CM | POA: Diagnosis not present

## 2019-07-30 DIAGNOSIS — Z95810 Presence of automatic (implantable) cardiac defibrillator: Secondary | ICD-10-CM | POA: Diagnosis not present

## 2019-07-30 LAB — BASIC METABOLIC PANEL
Anion gap: 10 (ref 5–15)
BUN: 32 mg/dL — ABNORMAL HIGH (ref 8–23)
CO2: 31 mmol/L (ref 22–32)
Calcium: 9.3 mg/dL (ref 8.9–10.3)
Chloride: 99 mmol/L (ref 98–111)
Creatinine, Ser: 1.32 mg/dL — ABNORMAL HIGH (ref 0.44–1.00)
GFR calc Af Amer: 43 mL/min — ABNORMAL LOW (ref >60)
GFR calc non Af Amer: 37 mL/min — ABNORMAL LOW (ref >60)
Glucose, Bld: 132 mg/dL — ABNORMAL HIGH (ref 70–99)
Potassium: 4.7 mmol/L (ref 3.5–5.1)
Sodium: 140 mmol/L (ref 135–145)

## 2019-07-30 LAB — PROCALCITONIN: Procalcitonin: <0.1 ng/mL

## 2019-07-30 MED ORDER — SERTRALINE HCL 50 MG PO TABS
50.0000 mg | ORAL_TABLET | Freq: Every day | ORAL | Status: DC
  Administered 2019-07-30 – 2019-08-05 (×7): 50 mg via ORAL
  Filled 2019-07-30 (×7): qty 1

## 2019-07-30 MED ORDER — BENZONATATE 100 MG PO CAPS
200.0000 mg | ORAL_CAPSULE | Freq: Two times a day (BID) | ORAL | Status: DC | PRN
  Administered 2019-07-30: 200 mg via ORAL
  Filled 2019-07-30: qty 2

## 2019-07-30 MED ORDER — BENZONATATE 100 MG PO CAPS
200.0000 mg | ORAL_CAPSULE | Freq: Three times a day (TID) | ORAL | Status: DC | PRN
  Administered 2019-07-30 – 2019-08-04 (×6): 200 mg via ORAL
  Filled 2019-07-30 (×6): qty 2

## 2019-07-30 MED ORDER — ALBUTEROL SULFATE (2.5 MG/3ML) 0.083% IN NEBU
2.5000 mg | INHALATION_SOLUTION | Freq: Three times a day (TID) | RESPIRATORY_TRACT | Status: DC
  Administered 2019-07-30 (×3): 2.5 mg via RESPIRATORY_TRACT
  Filled 2019-07-30 (×3): qty 3

## 2019-07-30 MED ORDER — ALBUTEROL SULFATE (2.5 MG/3ML) 0.083% IN NEBU
2.5000 mg | INHALATION_SOLUTION | Freq: Two times a day (BID) | RESPIRATORY_TRACT | Status: DC
  Administered 2019-07-31 – 2019-08-05 (×11): 2.5 mg via RESPIRATORY_TRACT
  Filled 2019-07-30 (×11): qty 3

## 2019-07-30 MED ORDER — ALPRAZOLAM 0.5 MG PO TABS
1.0000 mg | ORAL_TABLET | Freq: Two times a day (BID) | ORAL | Status: DC | PRN
  Administered 2019-07-30 – 2019-08-05 (×12): 1 mg via ORAL
  Filled 2019-07-30 (×12): qty 2

## 2019-07-30 NOTE — Consult Note (Signed)
Palliative Medicine Inpatient Consult Note  Reason for consult:  Goals of Care  HPI:  Per intake H&P --> Brandi Gardner is a 85 y.o. female with medical history significant of chronic systolic CHF status post AICD, CAD status post CABG, COPD, chronic respiratory failure oncontinuous2 L home oxygen, history of PE currently not on anticoagulation, hypertension, hyperlipidemia, anxiety presented with new onset chest pain and worsening of breathing symptoms.  Palliative care was asked to get involved to in the setting of recurrent hospitalizations to address goals of care.   Clinical Assessment/Goals of Care: I have reviewed medical records including EPIC notes, labs and imaging, received report from bedside RN, assessed the patient.    I met with Brandi Gardner to further discuss diagnosis prognosis, GOC, EOL wishes, disposition and options.   I introduced Palliative Medicine as specialized medical care for people living with serious illness. It focuses on providing relief from the symptoms and stress of a serious illness. The goal is to improve quality of life for both the patient and the family.  I asked Brandi to tell me about herself. She is from [place] where she spent most of her life. She was married to her husband for almost 43 years though he passed away in 05-21-04. They had six children together three are presently deceased. She shares her heartache over the recent losses of her daughters in May 21, 2022. They died six days apart of cancer. Her son died at the age for 92 in a motor vehicle accident. She also says that her beloved dog, Rebel died within the past two years. She shares that these last few months have been beyond difficult for her. 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 She use to smoke cigarettes but stopped twenty eight years ago. She is a woman of faith and ascribes to the [employer] religion.   In regard to her home situation.  Brandi use to live with her youngest daughter and now lives alone. She does all bADL's on her own though she relies on Shannon's former boyfriend, "Tank" to help with laundry and pick up medications/groceries. She said that she doesn't know where she would be without his help. He other sons live in [place].   A detailed discussion was had today regarding advanced directives, Brandi had prior appointed her eldest daughter to be her POA though since she is now deceased she would rely on her son, [name] to make decisions if she were unable to.    Concepts specific to code status, artifical feeding and hydration, continued IV antibiotics and rehospitalization was had.  Brandi states that she has undergone CPR before and survived therefore she would wish for a trial of all therapies. If the medical team does not feel that the patient has a meaningful chance of recovery she would rely on her son, [name] to discuss this in such an instance and make the decision to withdraw care. I shared with Brandi that I would worry if we were to pursue CPR or intubation on her given her degree of heart failure and COPD that she would not fair well. I shared with her the fear of causing worsening harm to her body and discussed medical futility. She told me that she had thought about this before and would like to stick with this decision.  I asked Brandi if a Palliative provider could follow up with her post discharge which she is amenable to.  The difference between a aggressive medical intervention  path  and a palliative comfort care path for this patient at this time was had. Values and goals of care important to patient and family were attempted to be elicited. The patient herself would like to regain strength to remain autonomous for as long as possible. She shares that she knows her heart and lung diseases are "bad", but she likes "doing for herself" and wants to continue this as able. She would not like to go to a SNF  and will only transfer to home with [facility] PT/OT.  Discussed the importance of continued conversation with family and their  medical providers regarding overall plan of care and treatment options, ensuring decisions are within the context of the patients values and GOCs.  Decision Maker: Patient can make decisions for herself  SUMMARY OF RECOMMENDATIONS   Full Code  Full Scope  TOC --> OP Palliative Care  Chaplain consult for grief and community resources  Continue to educate on the need to create a new POA in writing, provided paperwork at bedside  Code Status/Advance Care Planning: FULL CODE   Symptom Management:  Muscular Weakness:                 - Physical Therapy Evaluation                 - Occupational Therapy Evaluation   Xerostomia:                 - Good oral care QShift                 - Encourage liquid intake  Spiritual:                 - Chaplain consult  Need for emotional support:  - Utilize therapeutic listening  - Ensure safe space to express concerns  - Sit next to patient/family making eye contact   Palliative Prophylaxis:   Turn Q2H, Mobility, Oral Care  Additional Recommendations (Limitations, Scope, Preferences):  Full scope of treatment   Psycho-social/Spiritual:   Desire for further Chaplaincy support: Yes  Additional Recommendations: Education on progressive illness   Prognosis: Given 2 rehospitalization in 1 month in the setting of chronic illness and complicated grief patient will likely not fair well in the community. It is fair to predict < 1 year  Discharge Planning: Will likely discharge home with [facility] and OP Palliative Care   Vitals with BMI 07/30/2019 07/30/2019 07/30/2019  Height - - -  Weight - - -  BMI - - -  Systolic 299 242 683  Diastolic 53 64 73  Pulse 91 82 -   PPS: 60%    This conversation/these recommendations were discussed with patient primary care team, Dr. Eliseo Squires  Time In: 1145 Time Out: 1200 Total  Time:75 Greater than 50%  of this time was spent counseling and coordinating care related to the above assessment and plan.  [employer] Team Team Cell Phone: [phone] Please utilize secure chat with additional questions, if there is no response within 30 minutes please call the above phone number  Palliative Medicine Team providers are available by phone from 7am to 7pm daily and can be reached through the team cell phone.  Should this patient require assistance outside of these hours, please call the patient's attending physician.

## 2019-07-30 NOTE — Progress Notes (Signed)
Progress Note  [name] Date of Encounter: 07/30/2019  Primary Cardiologist: Mathis Bud, MD   Subjective   Feeling better.  Still has some chest discomfort when she tries to take a deep breath.  Overall she is better able to expand her lungs.  Inpatient Medications    Scheduled Meds: . acidophilus  1 capsule Oral Daily  . albuterol  2.5 mg Nebulization TID  . aspirin EC  81 mg Oral Daily  . budesonide  0.5 mg Nebulization BID  . carvedilol  3.125 mg Oral BID WC  . ferrous sulfate  324 mg Oral Q breakfast  . fluticasone  2 spray Each Nare Daily  . fluticasone furoate-vilanterol  1 puff Inhalation Daily  . furosemide  80 mg Oral q AM  . heparin injection (subcutaneous)  5,000 Units Subcutaneous Q12H  . pantoprazole  40 mg Oral QAC breakfast  . predniSONE  40 mg Oral Q breakfast  . umeclidinium bromide  1 puff Inhalation Daily  . [START ON 08/02/2019] Vitamin D (Ergocalciferol)  50,000 Units Oral Q Tue   Continuous Infusions:  PRN Meds: acetaminophen, albuterol, ALPRAZolam, benzonatate, diphenhydrAMINE, nitroGLYCERIN, nystatin-triamcinolone, ondansetron **OR** ondansetron (ZOFRAN) IV, tiZANidine   Vital Signs    Vitals:   07/30/19 0300 07/30/19 0447 07/30/19 0811 07/30/19 0909  BP: 118/73 117/64 (!) 115/53   Pulse:  82 91   Resp:  16    Temp:  (!) 97.5 F (36.4 C)    TempSrc:  Oral    SpO2:  99%  98%  Weight:      Height:        Intake/Output Summary (Last 24 hours) at 07/30/2019 1148 Last data filed at 07/30/2019 0816 Gross per 24 hour  Intake 240 ml  Output --  Net 240 ml   Last 3 Weights 07/29/2019 06/27/2019 08/30/2017  Weight (lbs) 145 lb 148 lb 125 lb 1.6 oz  Weight (kg) 65.772 kg 67.132 kg 56.745 kg      Telemetry    Sinus rhythm.  PVCs- Personally Reviewed  ECG    n/a - Personally Reviewed  Physical Exam   VS:  BP (!) 115/53 (BP Location: Left Arm)   Pulse 91   Temp (!) 97.5 F (36.4 C) (Oral)   Resp 16   Ht 5\' 4"   (1.626 m)   Wt 65.8 kg   SpO2 98%   BMI 24.89 kg/m  , BMI Body mass index is 24.89 kg/m. GENERAL: Frail.  Chronically ill-appearing. HEENT: Pupils equal round and reactive, fundi not visualized, oral mucosa unremarkable NECK:  No jugular venous distention, waveform within normal limits, carotid upstroke brisk and symmetric, no bruits LUNGS:  Mild expiratory wheezing.  Poor air movement.  HEART:  RRR.  PMI not displaced or sustained,S1 and S2 within normal limits, no S3, no S4, no clicks, no rubs, no murmurs ABD:  Flat, positive bowel sounds normal in frequency in pitch, no bruits, no rebound, no guarding, no midline pulsatile mass, no hepatomegaly, no splenomegaly EXT:  2 plus pulses throughout, no edema, no cyanosis no clubbing SKIN:  No rashes no nodules NEURO:  Cranial nerves II through XII grossly intact, motor grossly intact throughout PSYCH:  Cognitively intact, oriented to person place and time  Labs    High Sensitivity Troponin:   Recent Labs  Lab 07/29/19 1221 07/29/19 1421  TROPONINIHS 15 14      Chemistry Recent Labs  Lab 07/29/19 1221 07/30/19 0227  NA 142 140  K 3.3* 4.7  CL 99 99  CO2 30 31  GLUCOSE 143* 132*  BUN 32* 32*  CREATININE 1.36* 1.32*  CALCIUM 9.0 9.3  PROT 6.0*  --   ALBUMIN 3.2*  --   AST 13*  --   ALT 10  --   ALKPHOS 93  --   BILITOT 1.3*  --   GFRNONAA 36* 37*  GFRAA 42* 43*  ANIONGAP 13 10     Hematology Recent Labs  Lab 07/29/19 1221  WBC 6.2  RBC 3.55*  HGB 10.2*  HCT 33.1*  MCV 93.2  MCH 28.7  MCHC 30.8  RDW 13.7  PLT 240    BNP Recent Labs  Lab 07/29/19 1221  BNP 480.2*     DDimer No results for input(s): DDIMER in the last 168 hours.   Radiology    DG Chest 2 View  Result Date: 07/29/2019 CLINICAL DATA:  Chest pain and shortness of breath EXAM: CHEST - 2 VIEW COMPARISON:  June 27, 2019 FINDINGS: There is a small pleural effusion on the right. There is patchy airspace opacity in the right base. There is  slight left base atelectasis. Heart size and pulmonary vascularity are within normal limits. Pacemaker lead is attached to the right ventricular apex. Patient is status post coronary artery bypass grafting. No adenopathy. No bone lesions. IMPRESSION: Fairly small right pleural effusion with suspected superimposed pneumonia and atelectasis in the right base. Slight left base atelectasis. Left lung otherwise clear. Heart size within normal limits. Postoperative changes noted. No adenopathy evident. Electronically Signed   By: Lowella Grip III M.D.   On: 07/29/2019 12:57   DG CHEST PORT 1 VIEW  Result Date: 07/30/2019 CLINICAL DATA:  85 year old female with a history of congestive heart failure EXAM: PORTABLE CHEST 1 VIEW COMPARISON:  07/29/2019, 06/27/2019 FINDINGS: Cardiomediastinal silhouette unchanged in size and contour. Surgical changes of median sternotomy and CABG. Unchanged left chest wall cardiac pacing device/AICD. Similar appearance of opacity at the right lung base obscuring the right hemidiaphragm and the right heart border. Left lung base relatively unremarkable. Coarsened interstitial markings bilaterally. IMPRESSION: Unchanged opacity at the right lung base, potentially combination of atelectasis/consolidation and pleural fluid. Chronic lung changes with mild edema. Surgical changes of median sternotomy and CABG. Unchanged cardiac AICD. Electronically Signed   By: Corrie Mckusick D.O.   On: 07/30/2019 11:21    Cardiac Studies   Echo 06/28/2019: 1. Severe global reduction in LV systolic function; severe LVE; grade 1  diastolic dysfunction; mild to moderate MR; moderate LAE.  2. Left ventricular ejection fraction, by estimation, is 20 to 25%. The  left ventricle has severely decreased function. The left ventricle  demonstrates global hypokinesis. The left ventricular internal cavity size  was severely dilated. Left ventricular  diastolic parameters are consistent with Grade I diastolic  dysfunction  (impaired relaxation). Elevated left atrial pressure.  3. Right ventricular systolic function is normal. The right ventricular  size is normal.  4. Left atrial size was moderately dilated.  5. The mitral valve is normal in structure. Mild to moderate mitral valve  regurgitation. No evidence of mitral stenosis.  6. The aortic valve was not well visualized. Aortic valve regurgitation  is not visualized. No aortic stenosis is present.  7. The inferior vena cava is normal in size with greater than 50%  respiratory variability, suggesting right atrial pressure of 3 mmHg.   Patient Profile     85 y.o. female with COPD, chronic systolic and diastolic heart failure, status post  ICD, VT/VF arrest 07/2017, CKD 3, CAD status post CABG (2007), hypertension, hyperlipidemia, and prior PE admitted with chest pain in the setting of COPD exacerbation and possible pneumonia.  Assessment & Plan    #Chronic systolic and diastolic heart failure: Volume status is stable.  Troponin mildly elevated.  I do not think heart failure is contributing significantly to this admission.  Agree with continuing her home regimen of lasix and spironolactone.  # CAD s/p CABG:  Hs troponin flat at 15/14.  No ischemic work-up planned at this time.  Symptoms improving with treatment of her pulmonary disease.  Consider adding Imdur.  Continue aspirin and carvedilol.  She has a statin allergy.       For questions or updates, please contact [place] Please consult [email or web address] for contact info under        Signed, Skeet Latch, MD  07/30/2019, 11:48 AM

## 2019-07-30 NOTE — Evaluation (Signed)
Physical Therapy Evaluation Patient Details Name: Brandi Gardner MRN: [id number] DOB: 01/22/1935 Today's Date: 07/30/2019   History of Present Illness  Pt is an 85 y/o female admitted secondary to chest pain. Cardiology was consulted and felt that no ischemic work-up was needed at this time. Troponins were flat. PMH including but not limited to chronic systolic CHF status post AICD, CAD status post CABG, COPD, chronic respiratory failure on continuous 2 L home oxygen, history of PE currently not on anticoagulation, hypertension, hyperlipidemia, anxiety.    Clinical Impression  Pt presented supine in bed with HOB elevated, awake and willing to participate in therapy session. Prior to admission, pt reported that she ambulated without use of an AD and was independent with ADLs. She requires assistance from her deceased daughter's boyfriend for IADLs. She lives alone in a single level home with a ramped entrance. At the time of evaluation, pt limited secondary to weakness, fatigue and increased dyspnea with activity. Pt on 2L of supplemental O2 via [place] throughout with SpO2 maintaining >94%. HR stable in the 90's. Pt did not report any chest pain during session. Pt would continue to benefit from skilled physical therapy services at this time while admitted and after d/c to address the below listed limitations in order to improve overall safety and independence with functional mobility.     Follow Up Recommendations Home health PT    Equipment Recommendations  None recommended by PT    Recommendations for Other Services       Precautions / Restrictions Precautions Precautions: Fall Precaution Comments: on 2L of O2 at baseline Restrictions Weight Bearing Restrictions: No      Mobility  Bed Mobility Overal bed mobility: Needs Assistance Bed Mobility: Supine to Sit;Sit to Supine     Supine to sit: Supervision Sit to supine: Supervision      Transfers Overall transfer level: Needs  assistance Equipment used: None Transfers: Sit to/from Stand Sit to Stand: Supervision         General transfer comment: no instability or LOB, supervision for safety  Ambulation/Gait Ambulation/Gait assistance: Min guard;Min assist Gait Distance (Feet): 30 Feet Assistive device: None Gait Pattern/deviations: Step-through pattern;Decreased stride length Gait velocity: decreased   General Gait Details: pt able to ambulate within her room without use of an AD; one minor LOB requiring min A to recover; pt limited secondary to SOB and DOE despite being on 2L of O2 with SPO2 maintaining >94% throughout  Stairs            Wheelchair Mobility    Modified Rankin (Stroke Patients Only)       Balance Overall balance assessment: Needs assistance Sitting-balance support: Feet supported Sitting balance-Leahy Scale: Good     Standing balance support: During functional activity Standing balance-Leahy Scale: Fair                               Pertinent Vitals/Pain Pain Assessment: No/denies pain    Home Living Family/patient expects to be discharged to:: Private residence Living Arrangements: Other relatives Available Help at Discharge: Family;Available PRN/intermittently Type of Home: Apartment Home Access: Ramped entrance     Home Layout: One level Home Equipment: Walker - 2 wheels;Cane - single point;Shower seat      Prior Function Level of Independence: Independent;Needs assistance   Gait / Transfers Assistance Needed: Was not using AD PTA  ADL's / Homemaking Assistance Needed: Daughters fiance assists with grocery shopping and prescription pickup  Hand Dominance        Extremity/Trunk Assessment   Upper Extremity Assessment Upper Extremity Assessment: Overall WFL for tasks assessed    Lower Extremity Assessment Lower Extremity Assessment: Overall WFL for tasks assessed    Cervical / Trunk Assessment Cervical / Trunk Assessment:  Kyphotic  Communication   Communication: No difficulties  Cognition Arousal/Alertness: Awake/alert Behavior During Therapy: WFL for tasks assessed/performed;Anxious Overall Cognitive Status: Within Functional Limits for tasks assessed                                        General Comments      Exercises     Assessment/Plan    PT Assessment Patient needs continued PT services  PT Problem List Decreased activity tolerance;Decreased balance;Decreased mobility;Decreased coordination;Decreased knowledge of use of DME;Decreased safety awareness;Decreased knowledge of precautions;Cardiopulmonary status limiting activity       PT Treatment Interventions DME instruction;Gait training;Stair training;Functional mobility training;Therapeutic activities;Therapeutic exercise;Balance training;Neuromuscular re-education;Patient/family education    PT Goals (Current goals can be found in the Care Plan section)  Acute Rehab PT Goals Patient Stated Goal: for her breathing to improve PT Goal Formulation: With patient Time For Goal Achievement: 08/13/19 Potential to Achieve Goals: Good    Frequency Min 3X/week   Barriers to discharge Decreased caregiver support      Co-evaluation               AM-PAC PT "6 Clicks" Mobility  Outcome Measure Help needed turning from your back to your side while in a flat bed without using bedrails?: None Help needed moving from lying on your back to sitting on the side of a flat bed without using bedrails?: None Help needed moving to and from a bed to a chair (including a wheelchair)?: None Help needed standing up from a chair using your arms (e.g., wheelchair or bedside chair)?: None Help needed to walk in hospital room?: A Little Help needed climbing 3-5 steps with a railing? : A Lot 6 Click Score: 21    End of Session Equipment Utilized During Treatment: Oxygen Activity Tolerance: Patient limited by fatigue Patient left: in  bed;with call bell/phone within reach;with bed alarm set Nurse Communication: Mobility status PT Visit Diagnosis: Other abnormalities of gait and mobility (R26.89)    Time: 1308-6578 PT Time Calculation (min) (ACUTE ONLY): 19 min   Charges:   PT Evaluation $PT Eval Moderate Complexity: 1 Mod          Eduard Clos, PT, DPT  Acute Rehabilitation Services Pager [phone] Office [place] 07/30/2019, 2:27 PM

## 2019-07-30 NOTE — Progress Notes (Signed)
Progress Note    Brandi Sand  QQV:[id number] DOB: 10/10/1934  DOA: 07/29/2019 PCP: Cyndi Bender, PA-C    Brief Narrative:     Medical records reviewed and are as summarized below:  Brandi Gardner is an 85 y.o. female with medical history significant of chronic systolic CHF status post AICD, CAD status post CABG, COPD, chronic respiratory failure oncontinuous2 L home oxygen, history of PE currently not on anticoagulation, hypertension, hyperlipidemia, anxiety presented with new onset chest pain and worsening of breathing symptoms.  Her symptoms started about 1 week ago, started to have wheezing, she feels like she has chest congestion but cannot cough up anything, denies any fever chills.  For the last 3-4 morning she started to have episodic chest pains, usually happened after she woke up, localized midsternal, last for a few minutes, sometimes associated with worsening breathing symptoms.  This morning the pain radiated to bilateral fingertips and she feels clammy which resembles similar chest pain she had years ago when she had her attacks.  Chest pain last 20 to 30 minutes, resolved after given nitroglycerin.  Her last stress test/cath was in 2007.  Assessment/Plan:   Active Problems:   Angina pectoris (HCC)   Chest pain with high risk of acute coronary syndrome   Palliative care by specialist   Goals of care, counseling/discussion   Chest pain, angina like ruled out ACS -Trigger likely is her COPD exacerbation, however her pulse ox shows oxygenation 100%, -appreciate cardiology consult -Continue aspirin and statin -Her echo is up-to-date  Chronic resp failure -continue O2 at home  Acute COPD exacerbation -She is already on S ABA, LABA, LAMA, and inhaled steroid -add po steroids and pulm toilet  CKD stage IIIa -Creatinine level stable -continue Lasix  Chronic CHF systolic -Slight worsening of right-sided pleural effusion -monior closely -pro calcitonin  negative so doubt pna  Have requested palliative care consultation as patient's had 2 recent hospitalizations now and lives alone with support of a family friend due to recent death of both of her daughters   Family Communication/Anticipated D/C date and plan/Code Status   DVT prophylaxis: heparin Code Status: Full Code.  Disposition Plan: Status is: Observation  The patient will require care spanning > 2 midnights and should be moved to inpatient because: Unsafe d/c plan  Dispo: The patient is from: Home              Anticipated d/c is to: TBD-- PT Eval              Anticipated d/c date is: 1 day              Patient currently is not medically stable to d/c.    Medical Consultants:    Cards  Palliative care  Subjective:   Says she does not tolerate azithromycin it makes her sick also states she cannot take Mucinex as it makes her sick  Objective:    Vitals:   07/30/19 0447 07/30/19 0811 07/30/19 0909 07/30/19 1201  BP: 117/64 (!) 115/53  124/64  Pulse: 82 91  90  Resp: 16   16  Temp: (!) 97.5 F (36.4 C)   97.8 F (36.6 C)  TempSrc: Oral   Oral  SpO2: 99%  98% 96%  Weight:      Height:        Intake/Output Summary (Last 24 hours) at 07/30/2019 1302 Last data filed at 07/30/2019 1221 Gross per 24 hour  Intake 480 ml  Output --  Net 480 ml   Filed Weights   07/29/19 1209  Weight: 65.8 kg    Exam:  General: Appearance:    Well developed, well nourished female , mildly dyspneic      Lungs:     Diminished breath sounds, no wheezing  Heart:    Normal heart rate. Normal rhythm  MS:   All extremities are intact.   Neurologic:   Awake, alert, oriented x 3. No apparent focal neurological           defect.     Data Reviewed:   I have personally reviewed following labs and imaging studies:  Labs: Labs show the following:   Basic Metabolic Panel: Recent Labs  Lab 07/29/19 1221 07/29/19 1421 07/30/19 0227  NA 142  --  140  K 3.3*  --  4.7  CL 99   --  99  CO2 30  --  31  GLUCOSE 143*  --  132*  BUN 32*  --  32*  CREATININE 1.36*  --  1.32*  CALCIUM 9.0  --  9.3  MG  --  2.2  --    GFR Estimated Creatinine Clearance: 30.7 mL/min (A) (by C-G formula based on SCr of 1.32 mg/dL (H)). Liver Function Tests: Recent Labs  Lab 07/29/19 1221  AST 13*  ALT 10  ALKPHOS 93  BILITOT 1.3*  PROT 6.0*  ALBUMIN 3.2*   No results for input(s): LIPASE, AMYLASE in the last 168 hours. No results for input(s): AMMONIA in the last 168 hours. Coagulation profile No results for input(s): INR, PROTIME in the last 168 hours.  CBC: Recent Labs  Lab 07/29/19 1221  WBC 6.2  NEUTROABS 4.6  HGB 10.2*  HCT 33.1*  MCV 93.2  PLT 240   Cardiac Enzymes: No results for input(s): CKTOTAL, CKMB, CKMBINDEX, TROPONINI in the last 168 hours. BNP (last 3 results) No results for input(s): PROBNP in the last 8760 hours. CBG: No results for input(s): GLUCAP in the last 168 hours. D-Dimer: No results for input(s): DDIMER in the last 72 hours. Hgb A1c: No results for input(s): HGBA1C in the last 72 hours. Lipid Profile: No results for input(s): CHOL, HDL, LDLCALC, TRIG, CHOLHDL, LDLDIRECT in the last 72 hours. Thyroid function studies: No results for input(s): TSH, T4TOTAL, T3FREE, THYROIDAB in the last 72 hours.  Invalid input(s): FREET3 Anemia work up: No results for input(s): VITAMINB12, FOLATE, FERRITIN, TIBC, IRON, RETICCTPCT in the last 72 hours. Sepsis Labs: Recent Labs  Lab 07/29/19 1221 07/30/19 0805  PROCALCITON  --  <0.10  WBC 6.2  --     Microbiology Recent Results (from the past 240 hour(s))  SARS Coronavirus 2 by RT PCR (hospital order, performed in [facility] hospital lab) Nasopharyngeal Nasopharyngeal Swab     Status: None   Collection Time: 07/29/19  2:53 PM   Specimen: Nasopharyngeal Swab  Result Value Ref Range Status   SARS Coronavirus 2 NEGATIVE NEGATIVE Final    Comment: (NOTE) SARS-CoV-2 target nucleic acids are  NOT DETECTED. The SARS-CoV-2 RNA is generally detectable in upper and lower respiratory specimens during the acute phase of infection. The lowest concentration of SARS-CoV-2 viral copies this assay can detect is 250 copies / mL. A negative result does not preclude SARS-CoV-2 infection and should not be used as the sole basis for treatment or other patient management decisions.  A negative result may occur with improper specimen collection / handling, submission of specimen other than nasopharyngeal swab, presence of viral  mutation(s) within the areas targeted by this assay, and inadequate number of viral copies (<250 copies / mL). A negative result must be combined with clinical observations, patient history, and epidemiological information. Fact Sheet for Patients:   StrictlyIdeas.no Fact Sheet for Healthcare Providers: BankingDealers.co.za This test is not yet approved or cleared  by the [place] FDA and has been authorized for detection and/or diagnosis of SARS-CoV-2 by FDA under an Emergency Use Authorization (EUA).  This EUA will remain in effect (meaning this test can be used) for the duration of the COVID-19 declaration under Section 564(b)(1) of the Act, 21 U.S.C. section 360bbb-3(b)(1), unless the authorization is terminated or revoked sooner. Performed at [facility], [place] [address], [place], [place] [address]     Procedures and diagnostic studies:  DG Chest 2 View  Result Date: 07/29/2019 CLINICAL DATA:  Chest pain and shortness of breath EXAM: CHEST - 2 VIEW COMPARISON:  June 27, 2019 FINDINGS: There is a small pleural effusion on the right. There is patchy airspace opacity in the right base. There is slight left base atelectasis. Heart size and pulmonary vascularity are within normal limits. Pacemaker lead is attached to the right ventricular apex. Patient is status post coronary artery bypass grafting. No  adenopathy. No bone lesions. IMPRESSION: Fairly small right pleural effusion with suspected superimposed pneumonia and atelectasis in the right base. Slight left base atelectasis. Left lung otherwise clear. Heart size within normal limits. Postoperative changes noted. No adenopathy evident. Electronically Signed   By: Lowella Grip III M.D.   On: 07/29/2019 12:57   DG CHEST PORT 1 VIEW  Result Date: 07/30/2019 CLINICAL DATA:  85 year old female with a history of congestive heart failure EXAM: PORTABLE CHEST 1 VIEW COMPARISON:  07/29/2019, 06/27/2019 FINDINGS: Cardiomediastinal silhouette unchanged in size and contour. Surgical changes of median sternotomy and CABG. Unchanged left chest wall cardiac pacing device/AICD. Similar appearance of opacity at the right lung base obscuring the right hemidiaphragm and the right heart border. Left lung base relatively unremarkable. Coarsened interstitial markings bilaterally. IMPRESSION: Unchanged opacity at the right lung base, potentially combination of atelectasis/consolidation and pleural fluid. Chronic lung changes with mild edema. Surgical changes of median sternotomy and CABG. Unchanged cardiac AICD. Electronically Signed   By: Corrie Mckusick D.O.   On: 07/30/2019 11:21    Medications:   . acidophilus  1 capsule Oral Daily  . albuterol  2.5 mg Nebulization TID  . aspirin EC  81 mg Oral Daily  . budesonide  0.5 mg Nebulization BID  . carvedilol  3.125 mg Oral BID WC  . ferrous sulfate  324 mg Oral Q breakfast  . fluticasone  2 spray Each Nare Daily  . fluticasone furoate-vilanterol  1 puff Inhalation Daily  . furosemide  80 mg Oral q AM  . heparin injection (subcutaneous)  5,000 Units Subcutaneous Q12H  . pantoprazole  40 mg Oral QAC breakfast  . predniSONE  40 mg Oral Q breakfast  . umeclidinium bromide  1 puff Inhalation Daily  . [START ON 08/02/2019] Vitamin D (Ergocalciferol)  50,000 Units Oral Q Tue   Continuous Infusions:   LOS: 0 days    Geradine Girt  [facility]   How to contact the [facility] Attending or Consulting provider [place] or covering provider during after hours [place], for this patient?  1. Check the care team in [facility] and look for a) attending/consulting [facility] provider listed and b) the [facility] team listed 2. Log into  [email or web address] and use [place]'s universal password to access. If you do not have the password, please contact the hospital operator. 3. Locate the [facility] provider you are looking for under [facility] and page to a number that you can be directly reached. 4. If you still have difficulty reaching the provider, please page the [facility] (Director on Call) for the Hospitalists listed on amion for assistance.  07/30/2019, 1:02 PM

## 2019-07-30 NOTE — Progress Notes (Signed)
Chaplain followed up on previous chaplain's attempt to visit pt following spiritual consult; pt had been occupied.  Pt spoke about the death of her daughter-in-law, and two daughters, from liver disease and cancer, in the last year, and how the family is coping. The fiance "Tank" of one of her deceased daughters Larene [place]) has been checking in on her every day and looks after her apartment. Pt reports that talking to others helps. She is Catholic and values prayer.  Chaplain offered prayer and led pt in the Lord's Prayer, one of her favorites.  Of note: pt reports she had a very negative interaction with Hospice two years ago surrounding miscommunication around a DNR designation.  (Chaplain had suggested bereavement support groups there.)  Please contact if our department can offer further support.   Minus [place], [place]

## 2019-07-31 ENCOUNTER — Inpatient Hospital Stay (HOSPITAL_COMMUNITY): Payer: Medicare Other

## 2019-07-31 DIAGNOSIS — I209 Angina pectoris, unspecified: Secondary | ICD-10-CM

## 2019-07-31 DIAGNOSIS — R0600 Dyspnea, unspecified: Secondary | ICD-10-CM

## 2019-07-31 LAB — TROPONIN I (HIGH SENSITIVITY): Troponin I (High Sensitivity): 12 ng/L (ref <18)

## 2019-07-31 NOTE — Progress Notes (Signed)
Palliative Medicine Inpatient Follow Up Note   HPI: Per intake H&P --> Brandi Gardner a 85 y.o.femalewith medical history significantofchronic systolic CHF status post AICD, CAD status post CABG, COPD, chronic respiratory failure oncontinuous2 L home oxygen, history of PE currently not on anticoagulation, hypertension, hyperlipidemia, anxietypresented with new onset chest pain and worsening of breathing symptoms.  Palliative care was asked to get involved to in the setting of recurrent hospitalizations to address goals of care.     Today's Discussion (07/31/2019): Chart reviewed. I spoke to her bedside RN. Brandi complaints of the hospital food being inedible this morning. She states that she is having increased work of breathing this morning and is feeling more chest tightness than yesterday. Staff informed for breathing tx. Will order PRN morphine for dyspnea.  Again, discussed that I would worry about pursuing CPR or intubation on Brandi given her underlying conditions. She stated that she needs more time to think about this. I told her no decisions need to be made at this moment but it's important to understand the risks and benefit of doing such interventions . She said that she knows as the last time she received CPR two ribs were "broken."  Discussed the importance of continued conversation with family and their  medical providers regarding overall plan of care and treatment options, ensuring decisions are within the context of the patients values and GOCs.  "Hard Choices for [employer]" booklet.   Vital Signs Vitals:   07/31/19 0746 07/31/19 0753  BP: (!) 154/91 133/82  Pulse: (!) 117 (!) 111  Resp:    Temp:    SpO2:      Intake/Output Summary (Last 24 hours) at 07/31/2019 2992 Last data filed at 07/31/2019 0400 Gross per 24 hour  Intake 960 ml  Output 300 ml  Net 660 ml   Last Weight  Most recent update: 07/29/2019 12:09 PM   Weight  65.8 kg (145 lb)          SUMMARY OF RECOMMENDATIONS Full Code  Full Scope  OP Palliative Care  Chaplain consult for grief and community resources  Continue to educate on the need to create a new POA in writing, provided paperwork at bedside  Code Status/Advance Care Planning: FULL CODE  Symptom Management: Dyspnea:  - Breathing tx as needed  - No morphine in the setting of opiod allergies though if continues would complete a challenge with PRN antihistamine use  - Underlying causes being addressed by primary team  Muscular Weakness: - Physical Therapy Evaluation - Occupational Therapy Evaluation  Xerostomia: - Good oral care QShift - Encourage liquid intake  Spiritual: - Chaplain consult  Need for emotional support:                 - Utilize therapeutic listening                 - Ensure safe space to express concerns                 - Sit next to patient/family making eye contact  Time Spent: 25 Greater than 50% of the time was spent in counseling and coordination of care ______________________________________________________________________________________ [place] Team Team Cell Phone: [phone] Please utilize secure chat with additional questions, if there is no response within 30 minutes please call the above phone number  Palliative Medicine Team providers are available by phone from 7am to 7pm daily and can be reached through the team cell phone.  Should this patient require assistance outside of these hours, please call the patient's attending physician.

## 2019-07-31 NOTE — Progress Notes (Signed)
   07/31/19 0741  Vitals  BP (!) 168/94  MAP (mmHg) 116  BP Location Left Arm  BP Method Automatic  Patient Position (if appropriate) Lying  Pulse Rate (!) 124  Pulse Rate Source Monitor  Pain Assessment  Pain Scale 0-10  Pain Score 8  Pain Type Acute pain  Pain Location Chest  MEWS Score  MEWS Temp 0  MEWS Systolic 0  MEWS Pulse 2  MEWS RR 0  MEWS LOC 0  MEWS Score 2  MEWS Score Color Yellow  Provider Notification  Provider Name/Title Eliseo Squires  Date Provider Notified 07/31/19  Time Provider Notified [phone]  Notification Type Page  Notification Reason Change in status  Response See new orders;Other (Comment) (MD to bedside)  Date of Provider Response 07/31/19  Time of Provider Response 0750   EKG completed, handed to MD PRN 2 Nitro SL given, with pain relief

## 2019-07-31 NOTE — Progress Notes (Signed)
Progress Note    Brandi Sirmons  GEX:[id number] DOB: [birth year]  DOA: 07/29/2019 PCP: Cyndi Bender, PA-C    Brief Narrative:     Medical records reviewed and are as summarized below:  Brandi Gardner is an 85 y.o. female with medical history significant of chronic systolic CHF status post AICD, CAD status post CABG, COPD, chronic respiratory failure oncontinuous2 L home oxygen, history of PE currently not on anticoagulation, hypertension, hyperlipidemia, anxiety presented with new onset chest pain and worsening of breathing symptoms.  Her symptoms started about 1 week ago, started to have wheezing, she feels like she has chest congestion but cannot cough up anything, denies any fever chills.  For the last 3-4 morning she started to have episodic chest pains, usually happened after she woke up, localized midsternal, last for a few minutes, sometimes associated with worsening breathing symptoms.  This morning the pain radiated to bilateral fingertips and she feels clammy which resembles similar chest pain she had years ago when she had her attacks.  Chest pain last 20 to 30 minutes, resolved after given nitroglycerin.  Her last stress test/cath was in 2007.  Assessment/Plan:   Active Problems:   Angina pectoris (HCC)   Chest pain with high risk of acute coronary syndrome   Palliative care by specialist   Goals of care, counseling/discussion   Chest pain   Chest pain, angina like ruled out ACS -Trigger likely is her COPD exacerbation, however her pulse ox shows oxygenation 100%, -appreciate cardiology consult -Continue aspirin and statin -Her echo is up-to-date  Chronic resp failure -continue O2 at home  Acute COPD exacerbation -She is already on S ABA, LABA, LAMA, and inhaled steroid -add po steroids and pulm toilet  CKD stage IIIa -Creatinine level stable -continue Lasix  Chronic CHF systolic -Slight worsening of right-sided pleural effusion -monior closely -pro  calcitonin negative so doubt pna -if continues to have issues, may need cT scan    Family Communication/Anticipated D/C date and plan/Code Status   DVT prophylaxis: heparin Code Status: Full Code.  Disposition Plan: Status is: Inpatient  Remains inpatient appropriate because:Hemodynamically unstable   Dispo: The patient is from: Home              Anticipated d/c is to: tbd              Anticipated d/c date is: 2 days              Patient currently is not medically stable to d/c.      Medical Consultants:    Cards  Palliative care  Subjective:   Had episode of shortness of breath and high blood pressure this a.m.  Objective:    Vitals:   07/31/19 0859 07/31/19 1000 07/31/19 1159 07/31/19 1356  BP:  (!) 128/55 (!) 144/80 114/65  Pulse:  99 100 84  Resp:  18 20 17   Temp:  98.6 F (37 C) 98 F (36.7 C) 98.6 F (37 C)  TempSrc:  Oral  Oral  SpO2: 98% 100% 98% 99%  Weight:      Height:        Intake/Output Summary (Last 24 hours) at 07/31/2019 1444 Last data filed at 07/31/2019 1252 Gross per 24 hour  Intake 960 ml  Output 811 ml  Net 149 ml   Filed Weights   07/29/19 1209  Weight: 65.8 kg    Exam:  General: Appearance:    Chronically ill  female in no acute distress on [place]  Lungs:     Using abdominal muscles to breath, no wheezing, diminished at bases  Heart:    Normal heart rate. Normal rhythm.    MS:   All extremities are intact.   Neurologic:   Awake, alert, oriented x 3. No apparent focal neurological           defect.      Data Reviewed:   I have personally reviewed following labs and imaging studies:  Labs: Labs show the following:   Basic Metabolic Panel: Recent Labs  Lab 07/29/19 1221 07/29/19 1421 07/30/19 0227  NA 142  --  140  K 3.3*  --  4.7  CL 99  --  99  CO2 30  --  31  GLUCOSE 143*  --  132*  BUN 32*  --  32*  CREATININE 1.36*  --  1.32*  CALCIUM 9.0  --  9.3  MG  --  2.2  --    GFR Estimated Creatinine  Clearance: 30.7 mL/min (A) (by C-G formula based on SCr of 1.32 mg/dL (H)). Liver Function Tests: Recent Labs  Lab 07/29/19 1221  AST 13*  ALT 10  ALKPHOS 93  BILITOT 1.3*  PROT 6.0*  ALBUMIN 3.2*   No results for input(s): LIPASE, AMYLASE in the last 168 hours. No results for input(s): AMMONIA in the last 168 hours. Coagulation profile No results for input(s): INR, PROTIME in the last 168 hours.  CBC: Recent Labs  Lab 07/29/19 1221  WBC 6.2  NEUTROABS 4.6  HGB 10.2*  HCT 33.1*  MCV 93.2  PLT 240   Cardiac Enzymes: No results for input(s): CKTOTAL, CKMB, CKMBINDEX, TROPONINI in the last 168 hours. BNP (last 3 results) No results for input(s): PROBNP in the last 8760 hours. CBG: No results for input(s): GLUCAP in the last 168 hours. D-Dimer: No results for input(s): DDIMER in the last 72 hours. Hgb A1c: No results for input(s): HGBA1C in the last 72 hours. Lipid Profile: No results for input(s): CHOL, HDL, LDLCALC, TRIG, CHOLHDL, LDLDIRECT in the last 72 hours. Thyroid function studies: No results for input(s): TSH, T4TOTAL, T3FREE, THYROIDAB in the last 72 hours.  Invalid input(s): FREET3 Anemia work up: No results for input(s): VITAMINB12, FOLATE, FERRITIN, TIBC, IRON, RETICCTPCT in the last 72 hours. Sepsis Labs: Recent Labs  Lab 07/29/19 1221 07/30/19 0805  PROCALCITON  --  <0.10  WBC 6.2  --     Microbiology Recent Results (from the past 240 hour(s))  SARS Coronavirus 2 by RT PCR (hospital order, performed in [facility] hospital lab) Nasopharyngeal Nasopharyngeal Swab     Status: None   Collection Time: 07/29/19  2:53 PM   Specimen: Nasopharyngeal Swab  Result Value Ref Range Status   SARS Coronavirus 2 NEGATIVE NEGATIVE Final    Comment: (NOTE) SARS-CoV-2 target nucleic acids are NOT DETECTED. The SARS-CoV-2 RNA is generally detectable in upper and lower respiratory specimens during the acute phase of infection. The lowest concentration of  SARS-CoV-2 viral copies this assay can detect is 250 copies / mL. A negative result does not preclude SARS-CoV-2 infection and should not be used as the sole basis for treatment or other patient management decisions.  A negative result may occur with improper specimen collection / handling, submission of specimen other than nasopharyngeal swab, presence of viral mutation(s) within the areas targeted by this assay, and inadequate number of viral copies (<250 copies / mL). A negative result must be combined with clinical observations, patient history, and  epidemiological information. Fact Sheet for Patients:   StrictlyIdeas.no Fact Sheet for Healthcare Providers: BankingDealers.co.za This test is not yet approved or cleared  by the [place] FDA and has been authorized for detection and/or diagnosis of SARS-CoV-2 by FDA under an Emergency Use Authorization (EUA).  This EUA will remain in effect (meaning this test can be used) for the duration of the COVID-19 declaration under Section 564(b)(1) of the Act, 21 U.S.C. section 360bbb-3(b)(1), unless the authorization is terminated or revoked sooner. Performed at [facility], [place] [address], [place], [place] [address]     Procedures and diagnostic studies:  DG CHEST PORT 1 VIEW  Result Date: 07/31/2019 CLINICAL DATA:  Sudden onset of chest pain and dyspnea EXAM: PORTABLE CHEST 1 VIEW COMPARISON:  07/30/2019 FINDINGS: Cardiac shadow is stable. Postsurgical changes are noted. Defibrillator is again seen and stable. Right-sided pleural effusion is noted with right basilar infiltrates stable from the prior exam. Coarsened interstitial markings are noted bilaterally also stable from the prior exam. No new focal abnormality is noted. IMPRESSION: Stable appearance when compared with the previous day. Right effusion and infiltrate is noted. Electronically Signed   By: Inez Catalina M.D.   On:  07/31/2019 10:09   DG CHEST PORT 1 VIEW  Result Date: 07/30/2019 CLINICAL DATA:  85 year old female with a history of congestive heart failure EXAM: PORTABLE CHEST 1 VIEW COMPARISON:  07/29/2019, 06/27/2019 FINDINGS: Cardiomediastinal silhouette unchanged in size and contour. Surgical changes of median sternotomy and CABG. Unchanged left chest wall cardiac pacing device/AICD. Similar appearance of opacity at the right lung base obscuring the right hemidiaphragm and the right heart border. Left lung base relatively unremarkable. Coarsened interstitial markings bilaterally. IMPRESSION: Unchanged opacity at the right lung base, potentially combination of atelectasis/consolidation and pleural fluid. Chronic lung changes with mild edema. Surgical changes of median sternotomy and CABG. Unchanged cardiac AICD. Electronically Signed   By: Corrie Mckusick D.O.   On: 07/30/2019 11:21    Medications:   . acidophilus  1 capsule Oral Daily  . albuterol  2.5 mg Nebulization BID  . aspirin EC  81 mg Oral Daily  . budesonide  0.5 mg Nebulization BID  . carvedilol  3.125 mg Oral BID WC  . ferrous sulfate  324 mg Oral Q breakfast  . fluticasone  2 spray Each Nare Daily  . fluticasone furoate-vilanterol  1 puff Inhalation Daily  . furosemide  80 mg Oral q AM  . heparin injection (subcutaneous)  5,000 Units Subcutaneous Q12H  . pantoprazole  40 mg Oral QAC breakfast  . predniSONE  40 mg Oral Q breakfast  . sertraline  50 mg Oral Daily  . umeclidinium bromide  1 puff Inhalation Daily  . [START ON 08/02/2019] Vitamin D (Ergocalciferol)  50,000 Units Oral Q Tue   Continuous Infusions:   LOS: 1 day   Geradine Girt  [facility]   How to contact the [facility] Attending or Consulting provider [place] or covering provider during after hours [place], for this patient?  1. Check the care team in [facility] and look for a) attending/consulting TRH provider listed and b) the [facility] team listed 2. Log into [email or web address] and  use [place]'s universal password to access. If you do not have the password, please contact the hospital operator. 3. Locate the [facility] provider you are looking for under [facility] and page to a number that you can be directly reached. 4. If you still have difficulty reaching the  provider, please page the [facility] (Director on Call) for the Hospitalists listed on amion for assistance.  07/31/2019, 2:44 PM

## 2019-08-01 DIAGNOSIS — R0789 Other chest pain: Secondary | ICD-10-CM

## 2019-08-01 DIAGNOSIS — R0602 Shortness of breath: Secondary | ICD-10-CM

## 2019-08-01 LAB — BASIC METABOLIC PANEL
Anion gap: 13 (ref 5–15)
BUN: 40 mg/dL — ABNORMAL HIGH (ref 8–23)
CO2: 30 mmol/L (ref 22–32)
Calcium: 9.3 mg/dL (ref 8.9–10.3)
Chloride: 99 mmol/L (ref 98–111)
Creatinine, Ser: 1.47 mg/dL — ABNORMAL HIGH (ref 0.44–1.00)
GFR calc Af Amer: 38 mL/min — ABNORMAL LOW (ref >60)
GFR calc non Af Amer: 33 mL/min — ABNORMAL LOW (ref >60)
Glucose, Bld: 112 mg/dL — ABNORMAL HIGH (ref 70–99)
Potassium: 3.9 mmol/L (ref 3.5–5.1)
Sodium: 142 mmol/L (ref 135–145)

## 2019-08-01 LAB — CBC
HCT: 36.4 % (ref 36.0–46.0)
Hemoglobin: 11.5 g/dL — ABNORMAL LOW (ref 12.0–15.0)
MCH: 29.2 pg (ref 26.0–34.0)
MCHC: 31.6 g/dL (ref 30.0–36.0)
MCV: 92.4 fL (ref 80.0–100.0)
Platelets: 301 10*3/uL (ref 150–400)
RBC: 3.94 MIL/uL (ref 3.87–5.11)
RDW: 13.7 % (ref 11.5–15.5)
WBC: 7.1 10*3/uL (ref 4.0–10.5)
nRBC: 0 % (ref 0.0–0.2)

## 2019-08-01 MED ORDER — HEPARIN SODIUM (PORCINE) 5000 UNIT/ML IJ SOLN
5000.0000 [IU] | Freq: Three times a day (TID) | INTRAMUSCULAR | Status: DC
  Administered 2019-08-01 – 2019-08-02 (×2): 5000 [IU] via SUBCUTANEOUS
  Filled 2019-08-01 (×2): qty 1

## 2019-08-01 MED ORDER — ISOSORBIDE MONONITRATE ER 30 MG PO TB24
15.0000 mg | ORAL_TABLET | Freq: Every day | ORAL | Status: DC
  Administered 2019-08-01 – 2019-08-05 (×5): 15 mg via ORAL
  Filled 2019-08-01 (×5): qty 1

## 2019-08-01 NOTE — Progress Notes (Signed)
Pt alert and oriented x4. Pt woke up anxious and stated she felt like her heart was racing. HR at moment is 82 bpm. No arhythmia seen during cardiac monitoring. Pt states she feels like it might be from Imdur because she never taken it before. Pt educated and stated it was unlikely. Vitals WDL. Pt stable, will continue to monitor.

## 2019-08-01 NOTE — TOC Initial Note (Addendum)
Transition of Care [facility]) - Initial/Assessment Note    Patient Details  Name: Brandi Gardner MRN: [id number] Date of Birth: 11-14-1934  Transition of [facility] CM/SW Contact:    Marilu Favre, RN Phone Number: 08/01/2019, 11:05 AM  Clinical Narrative:                  Patient from home, known from last admission. Lives alone, two daughters passed away this year. One daughter boyfriend Tink helps patient at home. PAtient also has a grand daughter.   Patient has home oxygen with [employer].PAtient has portable tank. Tink provides transportation.   Patient active with [facility] for HHRN,PT, and SW and wants to remain with them. Entered orders and notified [place] with [place].  Patient agreeable to OP [employer]. Referral given to Cheri with Hospice and [place]  Expected Discharge Plan: [place] Barriers to Discharge: Continued Medical Work up   Patient Goals and CMS Choice Patient states their goals for this hospitalization and ongoing recovery are:: to return to home CMS Medicare.gov Compare Post Acute Care list provided to:: Patient Choice offered to / list presented to : Patient  Expected Discharge Plan and Services Expected Discharge Plan: [place]   Discharge Planning Services: CM Consult Post Acute Care Choice: [place] arrangements for the past 2 months: Apartment                 DME Arranged: N/A DME Agency: NA       HH Arranged: PT, RN, Social Work [place] Agency: [place] Date [facility] Agency Contacted: 08/01/19 Time HH Agency Contacted: 66 Representative spoke with at [place]: Tommi Rumps  Prior Living Arrangements/Services Living arrangements for the past 2 months: [place] with:: Self Patient language and need for interpreter reviewed:: Yes Do you feel safe going back to the place where you live?: Yes      Need for Family Participation in Patient Care: Yes  (Comment) Care giver support system in place?: Yes (comment) Current home services: DME Criminal Activity/Legal Involvement Pertinent to Current Situation/Hospitalization: No - Comment as needed  Activities of Daily Living Home Assistive Devices/Equipment: Walker (specify type) ADL Screening (condition at time of admission) Patient's cognitive ability adequate to safely complete daily activities?: Yes Is the patient deaf or have difficulty hearing?: Yes Does the patient have difficulty seeing, even when wearing glasses/contacts?: No Does the patient have difficulty concentrating, remembering, or making decisions?: No Patient able to express need for assistance with ADLs?: Yes Does the patient have difficulty dressing or bathing?: Yes Independently performs ADLs?: No Communication: Independent Dressing (OT): Needs assistance Is this a change from baseline?: Change from baseline, expected to last <3days Grooming: Needs assistance Is this a change from baseline?: Change from baseline, expected to last <3 days Feeding: Independent Bathing: Needs assistance Is this a change from baseline?: Change from baseline, expected to last <3 days Toileting: Needs assistance Is this a change from baseline?: Change from baseline, expected to last <3 days In/Out Bed: Needs assistance Is this a change from baseline?: Change from baseline, expected to last <3 days Walks in Home: Needs assistance Is this a change from baseline?: Change from baseline, expected to last <3 days Does the patient have difficulty walking or climbing stairs?: Yes Weakness of Legs: Both Weakness of Arms/Hands: Both  Permission Sought/Granted   Permission granted to share information with : No  Emotional Assessment Appearance:: Appears stated age Attitude/Demeanor/Rapport: Engaged Affect (typically observed): Accepting Orientation: : Oriented to Self, Oriented to Place, Oriented to  Time, Oriented to  Situation Alcohol / Substance Use: Not Applicable Psych Involvement: No (comment)  Admission diagnosis:  CHF (congestive heart failure) (HCC) [I50.9] Chest pain with high risk of acute coronary syndrome [R07.9] Chest pain [R07.9] Patient Active Problem List   Diagnosis Date Noted  . Chest pain 07/30/2019  . Palliative care by specialist   . Goals of care, counseling/discussion   . Angina pectoris ([place]) 07/29/2019  . Chest pain with high risk of acute coronary syndrome 07/29/2019  . Acute exacerbation of CHF (congestive heart failure) ([place]) 06/27/2019  . Chronic respiratory failure with hypoxia ([place]) 06/27/2019  . NSVT (nonsustained ventricular tachycardia) ([place]) 06/27/2019  . COPD (chronic obstructive pulmonary disease) ([place]) 06/27/2019  . AKI (acute kidney injury) ([place]) 06/27/2019  . Acute respiratory failure with hypoxia and hypercapnia (HCC)   . Congestive heart failure (CHF) ([place]) 08/26/2017  . CHF (congestive heart failure) ([place]) 08/26/2017  . Hyperkalemia 10/22/2016  . Mitral regurgitation 10/13/2016  . Hyperlipidemia   . Ischemic cardiomyopathy 05/24/2015  . S/P CABG (coronary artery bypass graft) 05/24/2015  . Cardiac defibrillator in place 04/25/2015  . Acute on chronic systolic (congestive) heart failure ([place]) 04/24/2015  . Chronic obstructive pulmonary disease with acute exacerbation ([place]) 04/22/2015  . Hx of pulmonary embolus 03/10/1978   PCP:  Cyndi Bender, PA-C Pharmacy:   [id number] - [place], [place] - [address] [facility] [place] [address] Phone: [phone] Fax: [phone]     Social Determinants of Health (SDOH) Interventions    Readmission Risk Interventions No flowsheet data found.

## 2019-08-01 NOTE — Progress Notes (Signed)
Progress Note  [name] Date of Encounter: 08/01/2019  Primary Cardiologist: Mathis Bud, MD   Subjective   No chest pain no SOB,  Did have episode yesterday afternoon.  BP was elevated. She was diaphoretic NTG resolved.   Inpatient Medications    Scheduled Meds: . acidophilus  1 capsule Oral Daily  . albuterol  2.5 mg Nebulization BID  . aspirin EC  81 mg Oral Daily  . budesonide  0.5 mg Nebulization BID  . carvedilol  3.125 mg Oral BID WC  . ferrous sulfate  324 mg Oral Q breakfast  . fluticasone  2 spray Each Nare Daily  . fluticasone furoate-vilanterol  1 puff Inhalation Daily  . furosemide  80 mg Oral q AM  . heparin injection (subcutaneous)  5,000 Units Subcutaneous Q12H  . pantoprazole  40 mg Oral QAC breakfast  . predniSONE  40 mg Oral Q breakfast  . sertraline  50 mg Oral Daily  . umeclidinium bromide  1 puff Inhalation Daily  . [START ON 08/02/2019] Vitamin D (Ergocalciferol)  50,000 Units Oral Q Tue   Continuous Infusions:  PRN Meds: acetaminophen, albuterol, ALPRAZolam, benzonatate, diphenhydrAMINE, nitroGLYCERIN, nystatin-triamcinolone, ondansetron **OR** ondansetron (ZOFRAN) IV, tiZANidine   Vital Signs    Vitals:   07/31/19 2113 08/01/19 0013 08/01/19 0510 08/01/19 0834  BP:  111/67 (!) 147/71   Pulse:  83 90   Resp:  17 16   Temp:  97.8 F (36.6 C) 97.9 F (36.6 C)   TempSrc:  Oral Oral   SpO2: 99% 99% 100% 99%  Weight:      Height:        Intake/Output Summary (Last 24 hours) at 08/01/2019 0950 Last data filed at 07/31/2019 1252 Gross per 24 hour  Intake 240 ml  Output 511 ml  Net -271 ml   Last 3 Weights 07/29/2019 06/27/2019 08/30/2017  Weight (lbs) 145 lb 148 lb 125 lb 1.6 oz  Weight (kg) 65.772 kg 67.132 kg 56.745 kg      Telemetry    SR - Personally Reviewed  ECG    No new - Personally Reviewed  Physical Exam   GEN: No acute distress.   Neck: No JVD Cardiac: RRR, no murmurs, rubs, or gallops.   Respiratory: Clear to auscultation bilaterally. GI: Soft, nontender, non-distended  MS: No edema; No deformity. Neuro:  Nonfocal  Psych: Normal affect   Labs    High Sensitivity Troponin:   Recent Labs  Lab 07/29/19 1221 07/29/19 1421 07/31/19 0826  TROPONINIHS 15 14 12       Chemistry Recent Labs  Lab 07/29/19 1221 07/30/19 0227 08/01/19 0458  NA 142 140 142  K 3.3* 4.7 3.9  CL 99 99 99  CO2 30 31 30   GLUCOSE 143* 132* 112*  BUN 32* 32* 40*  CREATININE 1.36* 1.32* 1.47*  CALCIUM 9.0 9.3 9.3  PROT 6.0*  --   --   ALBUMIN 3.2*  --   --   AST 13*  --   --   ALT 10  --   --   ALKPHOS 93  --   --   BILITOT 1.3*  --   --   GFRNONAA 36* 37* 33*  GFRAA 42* 43* 38*  ANIONGAP 13 10 13      Hematology Recent Labs  Lab 07/29/19 1221 08/01/19 0458  WBC 6.2 7.1  RBC 3.55* 3.94  HGB 10.2* 11.5*  HCT 33.1* 36.4  MCV 93.2 92.4  MCH 28.7 29.2  MCHC  30.8 31.6  RDW 13.7 13.7  PLT 240 301    BNP Recent Labs  Lab 07/29/19 1221  BNP 480.2*     DDimer No results for input(s): DDIMER in the last 168 hours.   Radiology    DG CHEST PORT 1 VIEW  Result Date: 07/31/2019 CLINICAL DATA:  Sudden onset of chest pain and dyspnea EXAM: PORTABLE CHEST 1 VIEW COMPARISON:  07/30/2019 FINDINGS: Cardiac shadow is stable. Postsurgical changes are noted. Defibrillator is again seen and stable. Right-sided pleural effusion is noted with right basilar infiltrates stable from the prior exam. Coarsened interstitial markings are noted bilaterally also stable from the prior exam. No new focal abnormality is noted. IMPRESSION: Stable appearance when compared with the previous day. Right effusion and infiltrate is noted. Electronically Signed   By: Inez Catalina M.D.   On: 07/31/2019 10:09    Cardiac Studies   Echo 06/28/19: 1. Severe global reduction in LV systolic function; severe LVE; grade 1  diastolic dysfunction; mild to moderate MR; moderate LAE.  2. Left ventricular ejection  fraction, by estimation, is 20 to 25%. The  left ventricle has severely decreased function. The left ventricle  demonstrates global hypokinesis. The left ventricular internal cavity size  was severely dilated. Left ventricular  diastolic parameters are consistent with Grade I diastolic dysfunction  (impaired relaxation). Elevated left atrial pressure.  3. Right ventricular systolic function is normal. The right ventricular  size is normal.  4. Left atrial size was moderately dilated.  5. The mitral valve is normal in structure. Mild to moderate mitral valve  regurgitation. No evidence of mitral stenosis.  6. The aortic valve was not well visualized. Aortic valve regurgitation  is not visualized. No aortic stenosis is present.  7. The inferior vena cava is normal in size with greater than 50%  respiratory variability, suggesting right atrial pressure of 3 mmHg.    Patient Profile     85 y.o. female with a hx of chronic systolic heart failure with EF 20% s/p MDT ICD 04/2015, VT & VF arrest 07/24/17 treated medically, CKD stage III, COPD on O2, CAD s/p CABG (2007), HTN, HLD, and hx of PE no AC, and admitted with chest pain.   Assessment & Plan    Atypical chest pain -Reassuringly, she has normal troponins.  No new ischemic changes on ECG.  chest pain has returned yesterday relief with sl NTG.    It is possible that her underlying COPD is the cause for some of her symptoms or perhaps her chest x-ray finding of suspected superimposed right-sided pneumonia at base. -Nonetheless she does have significant coronary artery disease, prior bypass surgery.  per Dr. Marlou Porch "I do not think at this time that her coronary artery disease is playing a role in her discomfort.  I would continue with conservative management.  No further cardiac testing at this time.  She is amenable to this." -Could try low-dose isosorbide 15 mg a day however I am unsure that this would change her symptomatology. -Continue  with Coreg Lasix and spironolactone.  Appears euvolemic. -Consider treatment for possible infiltrate --will add Imdur 15 mg, her BP is somewhat labile.  CAD post CABG in 2007 -Flat troponin 15/14, constant for 2 days.  Appears noncardiac.  CKD 3 -Avoid NSAIDs, contrast  Chronic resp failure with acute COPD exacerbation.       For questions or updates, please contact [facility] Please consult [email or web address] for contact info under  Signed, Cecilie Kicks, NP  08/01/2019, 9:50 AM

## 2019-08-01 NOTE — Progress Notes (Signed)
Progress Note    Brandi Gardner  ACZ:[id number] DOB: 1934-06-18  DOA: 07/29/2019 PCP: Cyndi Bender, PA-C    Brief Narrative:     Medical records reviewed and are as summarized below:  Brandi Gardner is an 85 y.o. female with medical history significant of chronic systolic CHF status post AICD, CAD status post CABG, COPD, chronic respiratory failure oncontinuous2 L home oxygen, history of PE currently not on anticoagulation, hypertension, hyperlipidemia, anxiety presented with new onset chest pain and worsening of breathing symptoms.  Her symptoms started about 1 week ago, started to have wheezing, she feels like she has chest congestion but cannot cough up anything, denies any fever chills.  For the last 3-4 morning she started to have episodic chest pains, usually happened after she woke up, localized midsternal, last for a few minutes, sometimes associated with worsening breathing symptoms.  This morning the pain radiated to bilateral fingertips and she feels clammy which resembles similar chest pain she had years ago when she had her attacks.  Chest pain last 20 to 30 minutes, resolved after given nitroglycerin.  Her last stress test/cath was in 2007.  Assessment/Plan:   Active Problems:   Angina pectoris (HCC)   Chest pain with high risk of acute coronary syndrome   Palliative care by specialist   Goals of care, counseling/discussion   Chest pain   Chest pain, ruled out ACS -Trigger likely is her COPD exacerbation, however her pulse ox shows oxygenation 100%, -appreciate cardiology consult -Continue aspirin and statin -Her echo is up-to-date -procalcitonin negative so doubt PNA  Chronic resp failure -continue O2 at home  Acute COPD exacerbation -She is already on S ABA, LABA, LAMA, and inhaled steroid -add po steroids and pulm toilet  CKD stage IIIa -Creatinine level stable -continue Lasix  Chronic CHF systolic -Slight worsening of right-sided pleural  effusion -monior closely -pro calcitonin negative so doubt pna -if continues to have issues with breathing, may need CT scan of lungs  appreciate palliative care consult.  Slow to improve.  Suspect anxiety plays a large role  Family Communication/Anticipated D/C date and plan/Code Status   DVT prophylaxis: heparin Code Status: Full Code.  Disposition Plan: Status is: Inpatient  Remains inpatient appropriate because:Hemodynamically unstable   Dispo: The patient is from: Home              Anticipated d/c is to: home              Anticipated d/c date is: AM if medically stable (no further issue with breathing)              Patient currently is not medically stable to d/c.      Medical Consultants:    Cards  Palliative care  Subjective:   No further episodes of high blood pressure or shortness of breath overnight  Objective:    Vitals:   07/31/19 2113 08/01/19 0013 08/01/19 0510 08/01/19 0834  BP:  111/67 (!) 147/71   Pulse:  83 90   Resp:  17 16   Temp:  97.8 F (36.6 C) 97.9 F (36.6 C)   TempSrc:  Oral Oral   SpO2: 99% 99% 100% 99%  Weight:      Height:        Intake/Output Summary (Last 24 hours) at 08/01/2019 1236 Last data filed at 07/31/2019 1252 Gross per 24 hour  Intake --  Output 511 ml  Net -511 ml   Filed Weights   07/29/19 1209  Weight: 65.8 kg    Exam:  General: Appearance:    Chronically ill female in no acute distress     Lungs:     On [place], diminished breath sounds  Heart:    Normal heart rate. Normal rhythm.    MS:   All extremities are intact.   Neurologic:   Awake, alert, oriented x 3. No apparent focal neurological           defect.       Data Reviewed:   I have personally reviewed following labs and imaging studies:  Labs: Labs show the following:   Basic Metabolic Panel: Recent Labs  Lab 07/29/19 1221 07/29/19 1221 07/29/19 1421 07/30/19 0227 08/01/19 0458  NA 142  --   --  140 142  K 3.3*   < >  --  4.7 3.9   CL 99  --   --  99 99  CO2 30  --   --  31 30  GLUCOSE 143*  --   --  132* 112*  BUN 32*  --   --  32* 40*  CREATININE 1.36*  --   --  1.32* 1.47*  CALCIUM 9.0  --   --  9.3 9.3  MG  --   --  2.2  --   --    < > = values in this interval not displayed.   GFR Estimated Creatinine Clearance: 27.5 mL/min (A) (by C-G formula based on SCr of 1.47 mg/dL (H)). Liver Function Tests: Recent Labs  Lab 07/29/19 1221  AST 13*  ALT 10  ALKPHOS 93  BILITOT 1.3*  PROT 6.0*  ALBUMIN 3.2*   No results for input(s): LIPASE, AMYLASE in the last 168 hours. No results for input(s): AMMONIA in the last 168 hours. Coagulation profile No results for input(s): INR, PROTIME in the last 168 hours.  CBC: Recent Labs  Lab 07/29/19 1221 08/01/19 0458  WBC 6.2 7.1  NEUTROABS 4.6  --   HGB 10.2* 11.5*  HCT 33.1* 36.4  MCV 93.2 92.4  PLT 240 301   Cardiac Enzymes: No results for input(s): CKTOTAL, CKMB, CKMBINDEX, TROPONINI in the last 168 hours. BNP (last 3 results) No results for input(s): PROBNP in the last 8760 hours. CBG: No results for input(s): GLUCAP in the last 168 hours. D-Dimer: No results for input(s): DDIMER in the last 72 hours. Hgb A1c: No results for input(s): HGBA1C in the last 72 hours. Lipid Profile: No results for input(s): CHOL, HDL, LDLCALC, TRIG, CHOLHDL, LDLDIRECT in the last 72 hours. Thyroid function studies: No results for input(s): TSH, T4TOTAL, T3FREE, THYROIDAB in the last 72 hours.  Invalid input(s): FREET3 Anemia work up: No results for input(s): VITAMINB12, FOLATE, FERRITIN, TIBC, IRON, RETICCTPCT in the last 72 hours. Sepsis Labs: Recent Labs  Lab 07/29/19 1221 07/30/19 0805 08/01/19 0458  PROCALCITON  --  <0.10  --   WBC 6.2  --  7.1    Microbiology Recent Results (from the past 240 hour(s))  SARS Coronavirus 2 by RT PCR (hospital order, performed in [facility] hospital lab) Nasopharyngeal Nasopharyngeal Swab     Status: None   Collection  Time: 07/29/19  2:53 PM   Specimen: Nasopharyngeal Swab  Result Value Ref Range Status   SARS Coronavirus 2 NEGATIVE NEGATIVE Final    Comment: (NOTE) SARS-CoV-2 target nucleic acids are NOT DETECTED. The SARS-CoV-2 RNA is generally detectable in upper and lower respiratory specimens during the acute phase of infection. The lowest  concentration of SARS-CoV-2 viral copies this assay can detect is 250 copies / mL. A negative result does not preclude SARS-CoV-2 infection and should not be used as the sole basis for treatment or other patient management decisions.  A negative result may occur with improper specimen collection / handling, submission of specimen other than nasopharyngeal swab, presence of viral mutation(s) within the areas targeted by this assay, and inadequate number of viral copies (<250 copies / mL). A negative result must be combined with clinical observations, patient history, and epidemiological information. Fact Sheet for Patients:   StrictlyIdeas.no Fact Sheet for Healthcare Providers: BankingDealers.co.za This test is not yet approved or cleared  by the [place] FDA and has been authorized for detection and/or diagnosis of SARS-CoV-2 by FDA under an Emergency Use Authorization (EUA).  This EUA will remain in effect (meaning this test can be used) for the duration of the COVID-19 declaration under Section 564(b)(1) of the Act, 21 U.S.C. section 360bbb-3(b)(1), unless the authorization is terminated or revoked sooner. Performed at [facility], [place] [address] [place], [place] [address]     Procedures and diagnostic studies:  DG CHEST PORT 1 VIEW  Result Date: 07/31/2019 CLINICAL DATA:  Sudden onset of chest pain and dyspnea EXAM: PORTABLE CHEST 1 VIEW COMPARISON:  07/30/2019 FINDINGS: Cardiac shadow is stable. Postsurgical changes are noted. Defibrillator is again seen and stable. Right-sided pleural  effusion is noted with right basilar infiltrates stable from the prior exam. Coarsened interstitial markings are noted bilaterally also stable from the prior exam. No new focal abnormality is noted. IMPRESSION: Stable appearance when compared with the previous day. Right effusion and infiltrate is noted. Electronically Signed   By: Inez Catalina M.D.   On: 07/31/2019 10:09    Medications:   . acidophilus  1 capsule Oral Daily  . albuterol  2.5 mg Nebulization BID  . aspirin EC  81 mg Oral Daily  . budesonide  0.5 mg Nebulization BID  . carvedilol  3.125 mg Oral BID WC  . ferrous sulfate  324 mg Oral Q breakfast  . fluticasone  2 spray Each Nare Daily  . fluticasone furoate-vilanterol  1 puff Inhalation Daily  . furosemide  80 mg Oral q AM  . heparin injection (subcutaneous)  5,000 Units Subcutaneous Q8H  . isosorbide mononitrate  15 mg Oral Daily  . pantoprazole  40 mg Oral QAC breakfast  . predniSONE  40 mg Oral Q breakfast  . sertraline  50 mg Oral Daily  . umeclidinium bromide  1 puff Inhalation Daily  . [START ON 08/02/2019] Vitamin D (Ergocalciferol)  50,000 Units Oral Q Tue   Continuous Infusions:   LOS: 2 days   Geradine Girt  [facility]   How to contact the [facility] Attending or Consulting provider [place] or covering provider during after hours [place], for this patient?  1. Check the care team in [facility] and look for a) attending/consulting [facility] provider listed and b) the [facility] team listed 2. Log into [email or web address] and use 's universal password to access. If you do not have the password, please contact the hospital operator. 3. Locate the [facility] provider you are looking for under [facility] and page to a number that you can be directly reached. 4. If you still have difficulty reaching the provider, please page the [facility] (Director on Call) for the Hospitalists listed on amion for assistance.  08/01/2019, 12:36 PM

## 2019-08-01 NOTE — Progress Notes (Signed)
Physical Therapy Treatment Patient Details Name: Brandi Gardner MRN: [id number] DOB: June 27, 1934 Today's Date: 08/01/2019    History of Present Illness Pt is an 85 y/o female admitted secondary to chest pain. Cardiology was consulted and felt that no ischemic work-up was needed at this time. Troponins were flat. PMH including but not limited to chronic systolic CHF status post AICD, CAD status post CABG, COPD, chronic respiratory failure on continuous 2 L home oxygen, history of PE currently not on anticoagulation, hypertension, hyperlipidemia, anxiety.    PT Comments    Patient reports feeling fatigued and so tired today so defers any OOB mobility despite education on importance of mobility. Agreeable to there ex in bed and working on IS and flutter valve. Pt pulling 500 on IS and difficulty with flutter valve. Pt anxious about glucose level due to it being elevated in March. Encouraged getting up and walking to bathroom as much as tolerated. Education re: energy conservation techniques. Will follow.   Follow Up Recommendations  Home health PT     Equipment Recommendations  None recommended by PT    Recommendations for Other Services       Precautions / Restrictions Precautions Precautions: Fall Precaution Comments: on 2L of O2 at baseline Restrictions Weight Bearing Restrictions: No    Mobility  Bed Mobility               General bed mobility comments: Deferred all mobility due to feeling tired and fatigued.  Transfers                    Ambulation/Gait                 Stairs             Wheelchair Mobility    Modified Rankin (Stroke Patients Only)       Balance                                            Cognition Arousal/Alertness: Awake/alert Behavior During Therapy: Anxious Overall Cognitive Status: Within Functional Limits for tasks assessed                                 General Comments:  Appears WFL for basic mobility tasks. Worried about her blood sugar being too high as it was in march.      Exercises General Exercises - Lower Extremity Ankle Circles/Pumps: AROM;Both;15 reps;Supine Heel Slides: AROM;Both;5 reps;Supine Hip ABduction/ADduction: AROM;Both;5 reps;Supine Straight Leg Raises: Strengthening;Both;10 reps;Supine Hip Flexion/Marching: AROM;Both;5 reps;Supine Other Exercises Other Exercises: worked on IS and acapella, "I have had this thing forever and have been doing it wrong the whole time!"    General Comments        Pertinent Vitals/Pain Pain Assessment: No/denies pain    Home Living                      Prior Function            PT Goals (current goals can now be found in the care plan section) Progress towards PT goals: Not progressing toward goals - comment(due to fatigue)    Frequency    Min 3X/week      PT Plan Current plan remains appropriate    Co-evaluation  AM-PAC PT "6 Clicks" Mobility   Outcome Measure  Help needed turning from your back to your side while in a flat bed without using bedrails?: None Help needed moving from lying on your back to sitting on the side of a flat bed without using bedrails?: None Help needed moving to and from a bed to a chair (including a wheelchair)?: A Little Help needed standing up from a chair using your arms (e.g., wheelchair or bedside chair)?: A Little Help needed to walk in hospital room?: A Little Help needed climbing 3-5 steps with a railing? : A Lot 6 Click Score: 19    End of Session Equipment Utilized During Treatment: Oxygen Activity Tolerance: Patient limited by fatigue Patient left: in bed;with call bell/phone within reach;with bed alarm set Nurse Communication: Mobility status PT Visit Diagnosis: Other abnormalities of gait and mobility (R26.89)     Time: 1884-1660 PT Time Calculation (min) (ACUTE ONLY): 18 min  Charges:  $Therapeutic  Exercise: 8-22 mins                     Marisa Severin, PT, DPT Acute Rehabilitation Services Pager [phone] Office [place] 08/01/2019, 1:02 PM

## 2019-08-01 NOTE — Progress Notes (Signed)
   08/01/19 1033  Clinical Encounter Type  Visited With Patient  Visit Type Spiritual support  Referral From Palliative care team  Consult/Referral To Chaplain  Stress Factors  Patient Stress Factors Loss;Family relationships (Grief)  This chaplain responded to PMT referral for Pt. spiritual care and rapport building. The chaplain recognized through the Pt. visit and reading chart notes the reoccurring theme of the Pt. loss of family members. The Pt explained her grief as occurring when she reflects on the loss and when she recognizes she has not thought about the death of her two daughters and son. The chaplain listened to the Pt. reflect on the meaning of her [employer] faith in her life story.  The Pt. prefers to hold on to her memories instead of a visit from a local parish priest.  The Pt. finds comfort in reciting the Lord's Prayer.  The chaplain accepted the Pt. invitation to  F/U with spiritual care.

## 2019-08-02 DIAGNOSIS — R0602 Shortness of breath: Secondary | ICD-10-CM

## 2019-08-02 LAB — GLUCOSE, CAPILLARY: Glucose-Capillary: 116 mg/dL — ABNORMAL HIGH (ref 70–99)

## 2019-08-02 MED ORDER — MORPHINE SULFATE 10 MG/5ML PO SOLN: 2.0000 mg | ORAL | Status: DC | PRN

## 2019-08-02 NOTE — Progress Notes (Signed)
Patient c/o not feeling well after using the bathroom. Seems to be anxious. Reassured and check vital signs. Vital signs slightly elevated from baseline readings. Will cont. to monitor.

## 2019-08-02 NOTE — Progress Notes (Signed)
Daily Progress Note   [name]       Date: 08/02/2019 DOB: 08-Feb-1935  Age: 85 y.o. MRN#: [id number] Attending Physician: Geradine Girt, DO Primary Care Physician: Cyndi Bender, PA-C Admit Date: 07/29/2019  Reason for Consultation/Follow-up: Establishing goals of care  Subjective: Patient resting well, tells me she had an episode this morning where she felt really funny - stomach upset and anxious.   Length of Stay: 3  Current Medications: Scheduled Meds:  . acidophilus  1 capsule Oral Daily  . albuterol  2.5 mg Nebulization BID  . aspirin EC  81 mg Oral Daily  . budesonide  0.5 mg Nebulization BID  . carvedilol  3.125 mg Oral BID WC  . ferrous sulfate  324 mg Oral Q breakfast  . fluticasone  2 spray Each Nare Daily  . fluticasone furoate-vilanterol  1 puff Inhalation Daily  . furosemide  80 mg Oral q AM  . isosorbide mononitrate  15 mg Oral Daily  . pantoprazole  40 mg Oral QAC breakfast  . predniSONE  40 mg Oral Q breakfast  . sertraline  50 mg Oral Daily  . umeclidinium bromide  1 puff Inhalation Daily  . Vitamin D (Ergocalciferol)  50,000 Units Oral Q Tue    Continuous Infusions:   PRN Meds: acetaminophen, albuterol, ALPRAZolam, benzonatate, diphenhydrAMINE, morphine, nitroGLYCERIN, nystatin-triamcinolone, ondansetron **OR** ondansetron (ZOFRAN) IV, tiZANidine  Physical Exam Constitutional:      General: She is not in acute distress. Pulmonary:     Effort: No tachypnea.     Comments: Mild acc. Muscle use Skin:    General: Skin is warm and dry.  Neurological:     Mental Status: She is alert and oriented to person, place, and time.             Vital Signs: BP (!) 103/58 (BP Location: Left Arm)   Pulse 75   Temp (!) 97.4 F (36.3 C) (Oral)   Resp 18    Ht 5\' 4"  (1.626 m)   Wt 65.8 kg   SpO2 97%   BMI 24.89 kg/m  SpO2: SpO2: 97 % O2 Device: O2 Device: Nasal Cannula O2 Flow Rate: O2 Flow Rate (L/min): 3 L/min  Intake/output summary:   Intake/Output Summary (Last 24 hours) at 08/02/2019 1309 Last data filed at 08/02/2019 0756 Gross per 24 hour  Intake 240 ml  Output --  Net 240 ml   LBM: Last BM Date: 08/01/19 Baseline Weight: Weight: 65.8 kg Most recent weight: Weight: 65.8 kg       Palliative Assessment/Data: PPS 40%      Patient Active Problem List   Diagnosis Date Noted  . Chest pain 07/30/2019  . Palliative care by specialist   . Goals of care, counseling/discussion   . Angina pectoris ([place]) 07/29/2019  . Chest pain with high risk of acute coronary syndrome 07/29/2019  . Acute exacerbation of CHF (congestive heart failure) ([place]) 06/27/2019  . Chronic respiratory failure with hypoxia ([place]) 06/27/2019  . NSVT (nonsustained ventricular tachycardia) ([place]) 06/27/2019  . COPD (chronic obstructive pulmonary disease) ([place]) 06/27/2019  . AKI (acute kidney injury) ([place]) 06/27/2019  . Acute respiratory failure with hypoxia and hypercapnia (HCC)   .  Congestive heart failure (CHF) ([place]) 08/26/2017  . CHF (congestive heart failure) ([place]) 08/26/2017  . Hyperkalemia 10/22/2016  . Mitral regurgitation 10/13/2016  . Hyperlipidemia   . Ischemic cardiomyopathy 05/24/2015  . S/P CABG (coronary artery bypass graft) 05/24/2015  . Cardiac defibrillator in place 04/25/2015  . Acute on chronic systolic (congestive) heart failure ([place]) 04/24/2015  . Chronic obstructive pulmonary disease with acute exacerbation ([place]) 04/22/2015  . Hx of pulmonary embolus 03/10/1978    Palliative Care Assessment & Plan   HPI: Per intake H&P -->Brandi Milesis a 85 y.o.femalewith medical history significantofchronic systolic CHF status post AICD, CAD status post CABG, COPD, chronic respiratory failure oncontinuous2 L home oxygen, history of PE  currently not on anticoagulation, hypertension, hyperlipidemia, anxietypresented with new onset chest pain and worsening of breathing symptoms.  Palliative care was asked to get involved to in the setting of recurrent hospitalizations to address goals of care.  Assessment: Follow up with patient - she tells me about episode this morning of anxiety/upset stomach/shortness of breath. Feels better now. Received xanax.  We discuss symptom management - could trial low dose liquid morphine in addition to her COPD meds. We discuss her allergies to multiple opioids. She tells me she has tolerated morphine well in the past and is agreeable to low dose trial.   She remains hopeful for home with home health and palliative to follow.   She expresses multiple concerns about coordinating care - listening and support provided.  Readdressed code status - she again shares that she has survived resuscitation attempt before and would want this attempted again. We discussed that her body is more frail now than it was then - she expresses understanding but remains committed to full code. She shares about a great grandchild that is expected in September and she is holding out hope to live and help care for this new baby and this adds to her desire for full code. She expresses understanding that DNR is recommended d/t her frailty and chronic conditions but requests to continue full code status.   We discuss that her son Brandi Gardner is HCPOA - she tells me she has shared her wishes with him. We discussed need for continued conversations with Brandi Gardner surrounding goals of care.  Recommendations/Plan:  Maintain full code status.  Son Brandi Gardner  Added 2 mg liquid morphine PO q2hr PRN shortness of breath (patient agreeable and has tolerated in the past despite multiple opioid allergies)  Outpatient palliative to follow  Code Status:  Full code  Prognosis:   Unable to determine - would likely qualify for hospice if  in line with goals  Discharge Planning:  Home with [place] was discussed with patient   Thank you for allowing the Palliative Medicine Team to assist in the care of this patient.   Total Time 40 minutes Prolonged Time Billed  no       Greater than 50%  of this time was spent counseling and coordinating care related to the above assessment and plan.  Juel Burrow, DNP, [facility] Palliative Medicine Team Team Phone # [phone]  Pager [phone]

## 2019-08-02 NOTE — Progress Notes (Signed)
Progress Note    Brandi Gardner  QIW:[id number] DOB: [birth year]  DOA: 07/29/2019 PCP: Cyndi Bender, PA-C    Brief Narrative:     Medical records reviewed and are as summarized below:  Brandi Gardner is an 85 y.o. female with medical history significant of chronic systolic CHF status post AICD, CAD status post CABG, COPD, chronic respiratory failure oncontinuous2 L home oxygen, history of PE currently not on anticoagulation, hypertension, hyperlipidemia, anxiety presented with new onset chest pain and worsening of breathing symptoms.  Her symptoms started about 1 week ago, started to have wheezing, she feels like she has chest congestion but cannot cough up anything, denies any fever chills.  For the last 3-4 morning she started to have episodic chest pains, usually happened after she woke up, localized midsternal, last for a few minutes, sometimes associated with worsening breathing symptoms.  This morning the pain radiated to bilateral fingertips and she feels clammy which resembles similar chest pain she had years ago when she had her attacks.  Chest pain last 20 to 30 minutes, resolved after given nitroglycerin.  Her last stress test/cath was in 2007.  Assessment/Plan:   Active Problems:   Angina pectoris (HCC)   Chest pain with high risk of acute coronary syndrome   Palliative care by specialist   Goals of care, counseling/discussion   Chest pain   Shortness of breath   Chest pain, ruled out ACS -Trigger likely is her COPD exacerbation, however her pulse ox shows oxygenation 100%, -appreciate cardiology consult -Continue aspirin and statin -Her echo is up-to-date -procalcitonin negative so doubt PNA -imdur added yesterday and patient no longer stating she has "chest pain"  Chronic resp failure -continue O2 at home -patient CAN NOT be off O2 at any time  Acute COPD exacerbation -She is already on S ABA, LABA, LAMA, and inhaled steroid -add po steroids and pulm  toilet -likely qualifies for hospice  CKD stage IIIa -Creatinine level stable -continue Lasix  Chronic CHF systolic -Slight worsening of right-sided pleural effusion -monior closely -pro calcitonin negative so doubt pna -if continues to have issues with breathing, may need CT scan of lungs  appreciate palliative care consult: Suspect patient would qualify for hospice if she decides to go down that avenue.  Currently she is a full code.  Slow to improve.  Suspect anxiety plays a large role.    Family Communication/Anticipated D/C date and plan/Code Status   DVT prophylaxis: scd Code Status: Full Code.  Disposition Plan: Status is: Inpatient  Remains inpatient appropriate because:Hemodynamically unstable   Dispo: The patient is from: Home              Anticipated d/c is to: home              Anticipated d/c date is: medications adjusted on 5/25-- home in AM if tolerated medication adjustment              Patient currently is not medically stable to d/c.      Medical Consultants:    Cards  Palliative care  Subjective:   Patient was walked to the bathroom this a.m. off of oxygen and she had episode of feeling funny and becoming anxious and short of breath  Objective:    Vitals:   08/02/19 0728 08/02/19 0812 08/02/19 0930 08/02/19 1128  BP: (!) 171/88  108/62 (!) 103/58  Pulse:   83 75  Resp:   18 18  Temp:   (!) 97.5 F (36.4  C) (!) 97.4 F (36.3 C)  TempSrc:   Oral Oral  SpO2:  93% 99% 97%  Weight:      Height:        Intake/Output Summary (Last 24 hours) at 08/02/2019 1340 Last data filed at 08/02/2019 0756 Gross per 24 hour  Intake 240 ml  Output --  Net 240 ml   Filed Weights   07/29/19 1209  Weight: 65.8 kg    Exam:  General: Appearance:   Chronically ill-appearing female in no acute distress     Lungs:    On nasal cannula, diminished but no wheezing, no increased work of breathing, able to carry on conversation without dyspnea  Heart:     Normal heart rate. Normal rhythm.    MS:   All extremities are intact.   Neurologic:   Awake, alert, oriented x 3. No apparent focal neurological           defect.        Data Reviewed:   I have personally reviewed following labs and imaging studies:  Labs: Labs show the following:   Basic Metabolic Panel: Recent Labs  Lab 07/29/19 1221 07/29/19 1221 07/29/19 1421 07/30/19 0227 08/01/19 0458  NA 142  --   --  140 142  K 3.3*   < >  --  4.7 3.9  CL 99  --   --  99 99  CO2 30  --   --  31 30  GLUCOSE 143*  --   --  132* 112*  BUN 32*  --   --  32* 40*  CREATININE 1.36*  --   --  1.32* 1.47*  CALCIUM 9.0  --   --  9.3 9.3  MG  --   --  2.2  --   --    < > = values in this interval not displayed.   GFR Estimated Creatinine Clearance: 27.5 mL/min (A) (by C-G formula based on SCr of 1.47 mg/dL (H)). Liver Function Tests: Recent Labs  Lab 07/29/19 1221  AST 13*  ALT 10  ALKPHOS 93  BILITOT 1.3*  PROT 6.0*  ALBUMIN 3.2*   No results for input(s): LIPASE, AMYLASE in the last 168 hours. No results for input(s): AMMONIA in the last 168 hours. Coagulation profile No results for input(s): INR, PROTIME in the last 168 hours.  CBC: Recent Labs  Lab 07/29/19 1221 08/01/19 0458  WBC 6.2 7.1  NEUTROABS 4.6  --   HGB 10.2* 11.5*  HCT 33.1* 36.4  MCV 93.2 92.4  PLT 240 301   Cardiac Enzymes: No results for input(s): CKTOTAL, CKMB, CKMBINDEX, TROPONINI in the last 168 hours. BNP (last 3 results) No results for input(s): PROBNP in the last 8760 hours. CBG: Recent Labs  Lab 08/02/19 0606  GLUCAP 116*   D-Dimer: No results for input(s): DDIMER in the last 72 hours. Hgb A1c: No results for input(s): HGBA1C in the last 72 hours. Lipid Profile: No results for input(s): CHOL, HDL, LDLCALC, TRIG, CHOLHDL, LDLDIRECT in the last 72 hours. Thyroid function studies: No results for input(s): TSH, T4TOTAL, T3FREE, THYROIDAB in the last 72 hours.  Invalid input(s):  FREET3 Anemia work up: No results for input(s): VITAMINB12, FOLATE, FERRITIN, TIBC, IRON, RETICCTPCT in the last 72 hours. Sepsis Labs: Recent Labs  Lab 07/29/19 1221 07/30/19 0805 08/01/19 0458  PROCALCITON  --  <0.10  --   WBC 6.2  --  7.1    Microbiology Recent Results (from the past  240 hour(s))  SARS Coronavirus 2 by RT PCR (hospital order, performed in [facility] hospital lab) Nasopharyngeal Nasopharyngeal Swab     Status: None   Collection Time: 07/29/19  2:53 PM   Specimen: Nasopharyngeal Swab  Result Value Ref Range Status   SARS Coronavirus 2 NEGATIVE NEGATIVE Final    Comment: (NOTE) SARS-CoV-2 target nucleic acids are NOT DETECTED. The SARS-CoV-2 RNA is generally detectable in upper and lower respiratory specimens during the acute phase of infection. The lowest concentration of SARS-CoV-2 viral copies this assay can detect is 250 copies / mL. A negative result does not preclude SARS-CoV-2 infection and should not be used as the sole basis for treatment or other patient management decisions.  A negative result may occur with improper specimen collection / handling, submission of specimen other than nasopharyngeal swab, presence of viral mutation(s) within the areas targeted by this assay, and inadequate number of viral copies (<250 copies / mL). A negative result must be combined with clinical observations, patient history, and epidemiological information. Fact Sheet for Patients:   StrictlyIdeas.no Fact Sheet for Healthcare Providers: BankingDealers.co.za This test is not yet approved or cleared  by the [place] FDA and has been authorized for detection and/or diagnosis of SARS-CoV-2 by FDA under an Emergency Use Authorization (EUA).  This EUA will remain in effect (meaning this test can be used) for the duration of the COVID-19 declaration under Section 564(b)(1) of the Act, 21 U.S.C. section 360bbb-3(b)(1),  unless the authorization is terminated or revoked sooner. Performed at [facility], [place] [address], [place], [place] [address]     Procedures and diagnostic studies:  No results found.  Medications:   . acidophilus  1 capsule Oral Daily  . albuterol  2.5 mg Nebulization BID  . aspirin EC  81 mg Oral Daily  . budesonide  0.5 mg Nebulization BID  . carvedilol  3.125 mg Oral BID WC  . ferrous sulfate  324 mg Oral Q breakfast  . fluticasone  2 spray Each Nare Daily  . fluticasone furoate-vilanterol  1 puff Inhalation Daily  . furosemide  80 mg Oral q AM  . isosorbide mononitrate  15 mg Oral Daily  . pantoprazole  40 mg Oral QAC breakfast  . predniSONE  40 mg Oral Q breakfast  . sertraline  50 mg Oral Daily  . umeclidinium bromide  1 puff Inhalation Daily  . Vitamin D (Ergocalciferol)  50,000 Units Oral Q Tue   Continuous Infusions:   LOS: 3 days   Geradine Girt  [facility]   How to contact the [facility] Attending or Consulting provider [place] or covering provider during after hours [place], for this patient?  1. Check the care team in [facility] and look for a) attending/consulting [facility] provider listed and b) the [facility] team listed 2. Log into [email or web address] and use [place]'s universal password to access. If you do not have the password, please contact the hospital operator. 3. Locate the [facility] provider you are looking for under [facility] and page to a number that you can be directly reached. 4. If you still have difficulty reaching the provider, please page the [facility] (Director on Call) for the Hospitalists listed on amion for assistance.  08/02/2019, 1:40 PM

## 2019-08-02 NOTE — Progress Notes (Signed)
Pt alert and oriented x4. Pt walked to the bathroom without oxygen and when she returned to bed she became very anxious and started getting "jittery." Pt was instructed to take deep breaths. Pt denied chest pain but stated she still wanted a nitro. Bilateral expiratory wheezing for her lung sounds. Pt also has non-productive cough.

## 2019-08-02 NOTE — Progress Notes (Signed)
   08/02/19 0728  Assess: MEWS Score  BP (!) 171/88  ECG Heart Rate (!) 136  Assess: MEWS Score  MEWS Temp 0  MEWS Systolic 0  MEWS Pulse 3  MEWS RR 0  MEWS LOC 0  MEWS Score 3  MEWS Score Color Yellow  Assess: if the MEWS score is Yellow or Red  Were vital signs taken at a resting state? Yes  Focused Assessment Documented focused assessment  Early Detection of Sepsis Score *See Row Information* Low  MEWS guidelines implemented *See Row Information* Yes  Treat  MEWS Interventions Administered prn meds/treatments;Consulted Respiratory Therapy  Take Vital Signs  Increase Vital Sign Frequency  Yellow: Q 2hr X 2 then Q 4hr X 2, if remains yellow, continue Q 4hrs  Escalate  MEWS: Escalate Yellow: discuss with charge nurse/RN and consider discussing with provider and RRT  Notify: Charge Nurse/RN  Name of Charge Nurse/RN Notified [place]  Date Charge Nurse/RN Notified 08/02/19  Time Charge Nurse/RN Notified 0730  Notify: Provider  Provider Name/Title Dyanne Carrel, NP  Date Provider Notified 08/02/19  Time Provider Notified 0730  Notification Type Face-to-face  Notification Reason Change in status;Requested by patient/family  Response No new orders  Date of Provider Response 08/02/19  Time of Provider Response 0730  Document  Patient Outcome Stabilized after interventions  Progress note created (see row info) Yes

## 2019-08-03 LAB — BASIC METABOLIC PANEL
Anion gap: 13 (ref 5–15)
BUN: 44 mg/dL — ABNORMAL HIGH (ref 8–23)
CO2: 31 mmol/L (ref 22–32)
Calcium: 9 mg/dL (ref 8.9–10.3)
Chloride: 95 mmol/L — ABNORMAL LOW (ref 98–111)
Creatinine, Ser: 1.42 mg/dL — ABNORMAL HIGH (ref 0.44–1.00)
GFR calc Af Amer: 40 mL/min — ABNORMAL LOW (ref >60)
GFR calc non Af Amer: 34 mL/min — ABNORMAL LOW (ref >60)
Glucose, Bld: 89 mg/dL (ref 70–99)
Potassium: 3.9 mmol/L (ref 3.5–5.1)
Sodium: 139 mmol/L (ref 135–145)

## 2019-08-03 LAB — CBC
HCT: 32.5 % — ABNORMAL LOW (ref 36.0–46.0)
Hemoglobin: 10.2 g/dL — ABNORMAL LOW (ref 12.0–15.0)
MCH: 28.8 pg (ref 26.0–34.0)
MCHC: 31.4 g/dL (ref 30.0–36.0)
MCV: 91.8 fL (ref 80.0–100.0)
Platelets: 256 10*3/uL (ref 150–400)
RBC: 3.54 MIL/uL — ABNORMAL LOW (ref 3.87–5.11)
RDW: 13.9 % (ref 11.5–15.5)
WBC: 6.8 10*3/uL (ref 4.0–10.5)
nRBC: 0 % (ref 0.0–0.2)

## 2019-08-03 MED ORDER — ENOXAPARIN SODIUM 30 MG/0.3ML ~~LOC~~ SOLN: 30.0000 mg | SUBCUTANEOUS | Status: DC

## 2019-08-03 MED ORDER — METHYLPREDNISOLONE SODIUM SUCC 125 MG IJ SOLR
60.0000 mg | Freq: Three times a day (TID) | INTRAMUSCULAR | Status: DC
  Administered 2019-08-03 – 2019-08-05 (×7): 60 mg via INTRAVENOUS
  Filled 2019-08-03 (×7): qty 2

## 2019-08-03 NOTE — Progress Notes (Signed)
Physical Therapy Treatment Patient Details Name: Brandi Gardner MRN: [id number] DOB: 01-Sep-1934 Today's Date: 08/03/2019    History of Present Illness Pt is an 85 y/o female admitted secondary to chest pain. Cardiology was consulted and felt that no ischemic work-up was needed at this time. Troponins were flat. PMH including but not limited to chronic systolic CHF status post AICD, CAD status post CABG, COPD, chronic respiratory failure on continuous 2 L home oxygen, history of PE currently not on anticoagulation, hypertension, hyperlipidemia, anxiety.    PT Comments    Pt demonstrating good progress.  She was able to increase gait distance, but cont to c/o fatigue and dyspnea.  Pt needing min A for balance without AD and cues for O2 tubing management.  Recommended use of RW at home.  Cont per POC.    Follow Up Recommendations  Home health PT;Supervision for mobility/OOB     Equipment Recommendations  None recommended by PT    Recommendations for Other Services       Precautions / Restrictions Precautions Precautions: Fall    Mobility  Bed Mobility Overal bed mobility: Needs Assistance Bed Mobility: Supine to Sit;Sit to Supine     Supine to sit: Supervision Sit to supine: Supervision      Transfers Overall transfer level: Needs assistance Equipment used: Rolling walker (2 wheeled) Transfers: Sit to/from Stand Sit to Stand: Min guard         General transfer comment: min guard for steadying  Ambulation/Gait Ambulation/Gait assistance: Min guard;Min assist Gait Distance (Feet): 80 Feet Assistive device: Rolling walker (2 wheeled);None Gait Pattern/deviations: Step-through pattern Gait velocity: decreased   General Gait Details: Pt ambulated 20' with RW and min guard for safety; ambulated 60' without AD but was unsteady and required min A for LOB.  Additionally, pt requiring min A and cues for O2 tubing management.   Stairs             Wheelchair  Mobility    Modified Rankin (Stroke Patients Only)       Balance Overall balance assessment: Needs assistance Sitting-balance support: Feet supported Sitting balance-Leahy Scale: Good     Standing balance support: During functional activity;Single extremity supported Standing balance-Leahy Scale: Fair Standing balance comment: performed toieltig ADLS with 1 UE support on RW                            Cognition Arousal/Alertness: Awake/alert Behavior During Therapy: Anxious Overall Cognitive Status: Within Functional Limits for tasks assessed                                        Exercises      General Comments General comments (skin integrity, edema, etc.): On 2 LPM O2 with O2 sats 98%; HR 90 rest and up to 104 walking.  Reports fatigue after toielting and walking - declined further exercise.  Advised use of RW, careful of O2 tubing, and supervision at home.      Pertinent Vitals/Pain Pain Assessment: No/denies pain    Home Living                      Prior Function            PT Goals (current goals can now be found in the care plan section) Progress towards PT goals: Progressing toward goals  Frequency    Min 3X/week      PT Plan Current plan remains appropriate    Co-evaluation              AM-PAC PT "6 Clicks" Mobility   Outcome Measure  Help needed turning from your back to your side while in a flat bed without using bedrails?: None Help needed moving from lying on your back to sitting on the side of a flat bed without using bedrails?: None Help needed moving to and from a bed to a chair (including a wheelchair)?: A Little Help needed standing up from a chair using your arms (e.g., wheelchair or bedside chair)?: A Little Help needed to walk in hospital room?: A Little Help needed climbing 3-5 steps with a railing? : A Little 6 Click Score: 20    End of Session Equipment Utilized During Treatment:  Oxygen Activity Tolerance: Patient limited by fatigue Patient left: in bed;with call bell/phone within reach;with bed alarm set Nurse Communication: Mobility status PT Visit Diagnosis: Other abnormalities of gait and mobility (R26.89)     Time: 7579-7282 PT Time Calculation (min) (ACUTE ONLY): 20 min  Charges:  $Gait Training: 8-22 mins                     Maggie Font, PT Acute Rehab Services Pager [phone] [facility] [phone] [facility] [place] 08/03/2019, 5:52 PM

## 2019-08-03 NOTE — Progress Notes (Signed)
PROGRESS NOTE    Brandi Gardner  QAS:[id number] DOB: 15-Oct-1934 DOA: 07/29/2019 PCP: Cyndi Bender, PA-C   Brief Narrative:  Brandi Faso is an 85 y.o. female with medical history significantofchronic systolic CHF status post AICD, CAD status post CABG, COPD, chronic respiratory failure oncontinuous2 L home oxygen, history of PE currently not on anticoagulation, hypertension, hyperlipidemia, anxietypresented with new onset chest pain and worsening of breathing symptoms. Her symptoms started about 1 week ago, started to have wheezing, she feels like she has chest congestion but cannot cough up anything,denies any fever chills. For the last 3-4 morning she started to have episodic chest pains, usually happened after she woke up, localized midsternal, last for a few minutes, sometimes associated with worsening breathing symptoms. This morning the pain radiated to bilateral fingertips and she feels clammy which resembles similar chest pain she had years ago when she had her attacks. Chest pain last 20 to 30 minutes,resolved after given nitroglycerin.Her last stress test/cath was in 2007.  Assessment & Plan:   Active Problems:   Angina pectoris (HCC)   Chest pain with high risk of acute coronary syndrome   Palliative care by specialist   Goals of care, counseling/discussion   Chest pain   Shortness of breath   Chest pain, ruled out ACS -Trigger likely is her COPD exacerbation and a lot of it is her anxiety.  She had no chest pain complaint today until I initiated talking about discharging her today and then she started complaining of chest pain. -appreciate cardiology consult.  ACS ruled out with all cardiac enzymes being negative. -Continue aspirin and statin -Her echo is up-to-date from 06/28/2019. -procalcitonin negative so doubt PNA -imdur added 08/02/2019.  Chronic hypoxic respiratory failure secondary to COPD: Uses 2 L of nasal cannula oxygen and that is what she is using.   She had no complaint of shortness of breath until we discussed about discharge planning however on exam, she is quite wheezy diffusely and bilaterally which raises concern for possible acute COPD exacerbation and for that reason I will start her on Solu-Medrol IV.  She is already on S ABA, LABA, LAMA and inhaled steroids.  Acute COPD exacerbation -She is already on S ABA, LABA, LAMA,and inhaled steroid -add po steroids and pulm toilet -likely qualifies for hospice  CKD stage IIIb -Creatinine level stable -continue Lasix  Chronic CHF systolic -Slight worsening of right-sided pleural effusion -monior closely -pro calcitonin negative so doubt pna -if continues to have issues with breathing, may need CT scan of lungs  DVT prophylaxis: Lovenox   Code Status: Full Code  Family Communication:  None present at bedside.  Plan of care discussed with patient in length and he verbalized understanding and agreed with it. Patient is from: Home Disposition Plan: Home with home health Barriers to discharge: Patient's unwillingness to go home  Status is: Inpatient  Remains inpatient appropriate because:Inpatient level of care appropriate due to severity of illness   Dispo: The patient is from: Home              Anticipated d/c is to: Home              Anticipated d/c date is: 1 day              Patient currently is medically stable to d/c.         Estimated body mass index is 24.89 kg/m as calculated from the following:   Height as of this encounter: 5\' 4"  (1.626 m).  Weight as of this encounter: 65.8 kg.      Nutritional status:               Consultants:   Cardiology  Procedures:   None  Antimicrobials:  Anti-infectives (From admission, onward)   Start     Dose/Rate Route Frequency Ordered Stop   07/30/19 1000  azithromycin (ZITHROMAX) tablet 250 mg  Status:  Discontinued     250 mg Oral Daily 07/29/19 1432 07/30/19 0851   07/29/19 1445  azithromycin  (ZITHROMAX) tablet 500 mg     500 mg Oral Daily 07/29/19 1432 07/29/19 1844         Subjective: Patient seen and examined this morning.  She initially stated that she was feeling better than yesterday.  She had no other complaint until I started conversation about discharging her home today and then she started complaining of shortness of breath and chest pain and stated that she does not feel well enough to go home and she does not feel that she has been stabilized and she is afraid to go home.  I tried to convince her that her oxygen requirement is same and she has been ruled out of cardiac etiology for her chest pain however she remained adamant on not going home.  Objective: Vitals:   08/03/19 0826 08/03/19 0833 08/03/19 0926 08/03/19 1231  BP:   122/62 119/63  Pulse:   85 92  Resp:    18  Temp:    97.6 F (36.4 C)  TempSrc:    Oral  SpO2: 99% 99%  95%  Weight:      Height:        Intake/Output Summary (Last 24 hours) at 08/03/2019 1421 Last data filed at 08/02/2019 2200 Gross per 24 hour  Intake 240 ml  Output --  Net 240 ml   Filed Weights   07/29/19 1209  Weight: 65.8 kg    Examination:  General exam: Appears anxious Respiratory system: Diffuse wheezes bilaterally. Respiratory effort normal. Cardiovascular system: S1 & S2 heard, RRR. No JVD, murmurs, rubs, gallops or clicks. No pedal edema. Gastrointestinal system: Abdomen is nondistended, soft and nontender. No organomegaly or masses felt. Normal bowel sounds heard. Central nervous system: Alert and oriented. No focal neurological deficits. Extremities: Symmetric 5 x 5 power. Skin: No rashes, lesions or ulcers Psychiatry: Judgement and insight appear poor, mood & affect anxious   Data Reviewed: I have personally reviewed following labs and imaging studies  CBC: Recent Labs  Lab 07/29/19 1221 08/01/19 0458 08/03/19 0501  WBC 6.2 7.1 6.8  NEUTROABS 4.6  --   --   HGB 10.2* 11.5* 10.2*  HCT 33.1* 36.4  32.5*  MCV 93.2 92.4 91.8  PLT 240 301 096   Basic Metabolic Panel: Recent Labs  Lab 07/29/19 1221 07/29/19 1421 07/30/19 0227 08/01/19 0458 08/03/19 0501  NA 142  --  140 142 139  K 3.3*  --  4.7 3.9 3.9  CL 99  --  99 99 95*  CO2 30  --  31 30 31   GLUCOSE 143*  --  132* 112* 89  BUN 32*  --  32* 40* 44*  CREATININE 1.36*  --  1.32* 1.47* 1.42*  CALCIUM 9.0  --  9.3 9.3 9.0  MG  --  2.2  --   --   --    GFR: Estimated Creatinine Clearance: 28.5 mL/min (A) (by C-G formula based on SCr of 1.42 mg/dL (H)). Liver Function Tests: Recent  Labs  Lab 07/29/19 1221  AST 13*  ALT 10  ALKPHOS 93  BILITOT 1.3*  PROT 6.0*  ALBUMIN 3.2*   No results for input(s): LIPASE, AMYLASE in the last 168 hours. No results for input(s): AMMONIA in the last 168 hours. Coagulation Profile: No results for input(s): INR, PROTIME in the last 168 hours. Cardiac Enzymes: No results for input(s): CKTOTAL, CKMB, CKMBINDEX, TROPONINI in the last 168 hours. BNP (last 3 results) No results for input(s): PROBNP in the last 8760 hours. HbA1C: No results for input(s): HGBA1C in the last 72 hours. CBG: Recent Labs  Lab 08/02/19 0606  GLUCAP 116*   Lipid Profile: No results for input(s): CHOL, HDL, LDLCALC, TRIG, CHOLHDL, LDLDIRECT in the last 72 hours. Thyroid Function Tests: No results for input(s): TSH, T4TOTAL, FREET4, T3FREE, THYROIDAB in the last 72 hours. Anemia Panel: No results for input(s): VITAMINB12, FOLATE, FERRITIN, TIBC, IRON, RETICCTPCT in the last 72 hours. Sepsis Labs: Recent Labs  Lab 07/30/19 0805  PROCALCITON <0.10    Recent Results (from the past 240 hour(s))  SARS Coronavirus 2 by RT PCR (hospital order, performed in [facility] hospital lab) Nasopharyngeal Nasopharyngeal Swab     Status: None   Collection Time: 07/29/19  2:53 PM   Specimen: Nasopharyngeal Swab  Result Value Ref Range Status   SARS Coronavirus 2 NEGATIVE NEGATIVE Final    Comment: (NOTE) SARS-CoV-2  target nucleic acids are NOT DETECTED. The SARS-CoV-2 RNA is generally detectable in upper and lower respiratory specimens during the acute phase of infection. The lowest concentration of SARS-CoV-2 viral copies this assay can detect is 250 copies / mL. A negative result does not preclude SARS-CoV-2 infection and should not be used as the sole basis for treatment or other patient management decisions.  A negative result may occur with improper specimen collection / handling, submission of specimen other than nasopharyngeal swab, presence of viral mutation(s) within the areas targeted by this assay, and inadequate number of viral copies (<250 copies / mL). A negative result must be combined with clinical observations, patient history, and epidemiological information. Fact Sheet for Patients:   StrictlyIdeas.no Fact Sheet for Healthcare Providers: BankingDealers.co.za This test is not yet approved or cleared  by the [place] FDA and has been authorized for detection and/or diagnosis of SARS-CoV-2 by FDA under an Emergency Use Authorization (EUA).  This EUA will remain in effect (meaning this test can be used) for the duration of the COVID-19 declaration under Section 564(b)(1) of the Act, 21 U.S.C. section 360bbb-3(b)(1), unless the authorization is terminated or revoked sooner. Performed at [facility], [place] [address], [place], [place] [address]       Radiology Studies: No results found.  Scheduled Meds: . acidophilus  1 capsule Oral Daily  . albuterol  2.5 mg Nebulization BID  . aspirin EC  81 mg Oral Daily  . budesonide  0.5 mg Nebulization BID  . carvedilol  3.125 mg Oral BID WC  . ferrous sulfate  324 mg Oral Q breakfast  . fluticasone  2 spray Each Nare Daily  . fluticasone furoate-vilanterol  1 puff Inhalation Daily  . furosemide  80 mg Oral q AM  . isosorbide mononitrate  15 mg Oral Daily  . methylPREDNISolone  (SOLU-MEDROL) injection  60 mg Intravenous Q8H  . pantoprazole  40 mg Oral QAC breakfast  . predniSONE  40 mg Oral Q breakfast  . sertraline  50 mg Oral Daily  . umeclidinium bromide  1 puff  Inhalation Daily  . Vitamin D (Ergocalciferol)  50,000 Units Oral Q Tue   Continuous Infusions:   LOS: 4 days   Time spent: 37 minutes   Darliss Cheney, MD [facility]  08/03/2019, 2:21 PM   To contact the attending provider between 7A-7P or the covering provider during after hours 7P-7A, please log into the web site [email or web address].

## 2019-08-03 NOTE — Consult Note (Signed)
   [facility] Inpatient Consult   08/03/2019  Brandi Gardner 22-Jan-1935 [id number]   [facility] ACO Patient:  Medicare Next Gen  Primary Care Provider:  Cyndi Bender, PA-C  Patient assessed for unplanned readmission less than 30 days with high risk score noted.  Palliative Care consult reviewed as well as inpatient Transition of Care RNCM notes for home palliative care program with [facility]. Home health noted as well with [facility].  Plan:  Follow for disposition and progress.   For questions, contact:   Natividad Brood, RN BSN [name] Liaison  [phone] business mobile phone Toll free office [phone]  Fax number: [phone] [email or web address]

## 2019-08-04 LAB — CBC
HCT: 33.5 % — ABNORMAL LOW (ref 36.0–46.0)
Hemoglobin: 10.8 g/dL — ABNORMAL LOW (ref 12.0–15.0)
MCH: 29.2 pg (ref 26.0–34.0)
MCHC: 32.2 g/dL (ref 30.0–36.0)
MCV: 90.5 fL (ref 80.0–100.0)
Platelets: 259 K/uL (ref 150–400)
RBC: 3.7 MIL/uL — ABNORMAL LOW (ref 3.87–5.11)
RDW: 14 % (ref 11.5–15.5)
WBC: 5.3 K/uL (ref 4.0–10.5)
nRBC: 0 % (ref 0.0–0.2)

## 2019-08-04 LAB — BASIC METABOLIC PANEL WITH GFR
Anion gap: 13 (ref 5–15)
BUN: 43 mg/dL — ABNORMAL HIGH (ref 8–23)
CO2: 29 mmol/L (ref 22–32)
Calcium: 9.4 mg/dL (ref 8.9–10.3)
Chloride: 94 mmol/L — ABNORMAL LOW (ref 98–111)
Creatinine, Ser: 1.49 mg/dL — ABNORMAL HIGH (ref 0.44–1.00)
GFR calc Af Amer: 38 mL/min — ABNORMAL LOW
GFR calc non Af Amer: 32 mL/min — ABNORMAL LOW
Glucose, Bld: 266 mg/dL — ABNORMAL HIGH (ref 70–99)
Potassium: 3.6 mmol/L (ref 3.5–5.1)
Sodium: 136 mmol/L (ref 135–145)

## 2019-08-04 NOTE — Progress Notes (Signed)
PROGRESS NOTE    Brandi Kroboth  XFG:[id number] DOB: 1934-10-09 DOA: 07/29/2019 PCP: Cyndi Bender, PA-C   Brief Narrative:  Brandi Gardner is an 85 y.o. female with medical history significantofchronic systolic CHF status post AICD, CAD status post CABG, COPD, chronic respiratory failure oncontinuous2 L home oxygen, history of PE currently not on anticoagulation, hypertension, hyperlipidemia, anxietypresented with new onset chest pain and worsening of breathing symptoms. Her symptoms started about 1 week ago, started to have wheezing, she feels like she has chest congestion but cannot cough up anything,denies any fever chills. For the last 3-4 morning she started to have episodic chest pains, usually happened after she woke up, localized midsternal, last for a few minutes, sometimes associated with worsening breathing symptoms. This morning the pain radiated to bilateral fingertips and she feels clammy which resembles similar chest pain she had years ago when she had her attacks. Chest pain last 20 to 30 minutes,resolved after given nitroglycerin.Her last stress test/cath was in 2007.  Assessment & Plan:   Active Problems:   Angina pectoris (HCC)   Chest pain with high risk of acute coronary syndrome   Palliative care by specialist   Goals of care, counseling/discussion   Chest pain   Shortness of breath  Chest pain, ruled out ACS -Trigger likely is her COPD exacerbation and a lot of it is her anxiety. appreciate cardiology consult.  ACS ruled out with all cardiac enzymes being negative. -Continue aspirin and statin -Her echo is up-to-date from 06/28/2019. -procalcitonin negative so doubt PNA -imdur added 08/02/2019.  Chronic hypoxic respiratory failure secondary to COPD: Uses 2 L of nasal cannula oxygen and that is what she is using.  She is already on S ABA, LABA, LAMA and inhaled steroids.  Still wheezing on exam but improved.  Continue IV Solu-Medrol.  Acute COPD  exacerbation -She is already on S ABA, LABA, LAMA,and inhaled steroid/added IV Solu-Medrol yesterday.  Continue that.  Seen by palliative care.  She qualifies for hospice but she is adamant on continuing full code.  CKD stage IIIb -Creatinine level stable -continue Lasix  Chronic CHF systolic -Slight worsening of right-sided pleural effusion -monior closely -pro calcitonin negative so doubt pna -if continues to have issues with breathing, may need CT scan of lungs   DVT prophylaxis: Lovenox   Code Status: Full Code  Family Communication:  None present at bedside.  Plan of care discussed with patient in length and he verbalized understanding and agreed with it. Patient is from: Home Disposition Plan: Home with home health hopefully tomorrow. Barriers to discharge: Patient's unwillingness to go home  Status is: Inpatient  Remains inpatient appropriate because:Inpatient level of care appropriate due to severity of illness   Dispo: The patient is from: Home              Anticipated d/c is to: Home              Anticipated d/c date is: 1 day              Patient currently is medically stable to d/c.         Estimated body mass index is 24.89 kg/m as calculated from the following:   Height as of this encounter: 5\' 4"  (1.626 m).   Weight as of this encounter: 65.8 kg.      Nutritional status:               Consultants:   Cardiology  Procedures:   None  Antimicrobials:  Anti-infectives (From admission, onward)   Start     Dose/Rate Route Frequency Ordered Stop   07/30/19 1000  azithromycin (ZITHROMAX) tablet 250 mg  Status:  Discontinued     250 mg Oral Daily 07/29/19 1432 07/30/19 0851   07/29/19 1445  azithromycin (ZITHROMAX) tablet 500 mg     500 mg Oral Daily 07/29/19 1432 07/29/19 1844         Subjective: Seen and examined.  She still complains of chest heaviness and shortness of breath and is very anxious whenever I bring up discharging  her home.  She is requesting to keep her 1 more night and promises that she will be ready to go home tomorrow.  Objective: Vitals:   08/03/19 1954 08/03/19 2345 08/04/19 0444 08/04/19 0841  BP:  137/65 (!) 146/72   Pulse:  84 92   Resp:  18 16   Temp:  98 F (36.7 C) 97.7 F (36.5 C)   TempSrc:  Oral Oral   SpO2: 98% 100% 96% 95%  Weight:      Height:        Intake/Output Summary (Last 24 hours) at 08/04/2019 1119 Last data filed at 08/03/2019 2347 Gross per 24 hour  Intake 120 ml  Output --  Net 120 ml   Filed Weights   07/29/19 1209  Weight: 65.8 kg    Examination:  General exam: Appears calm and comfortable  Respiratory system: Diffuse wheezes bilaterally, no rhonchi or crackles.  Respiratory effort normal. Cardiovascular system: S1 & S2 heard, RRR. No JVD, murmurs, rubs, gallops or clicks. No pedal edema. Gastrointestinal system: Abdomen is nondistended, soft and nontender. No organomegaly or masses felt. Normal bowel sounds heard. Central nervous system: Alert and oriented. No focal neurological deficits. Extremities: Symmetric 5 x 5 power. Skin: No rashes, lesions or ulcers.  Psychiatry: Judgement and insight appear normal. Mood & affect anxious  Data Reviewed: I have personally reviewed following labs and imaging studies  CBC: Recent Labs  Lab 07/29/19 1221 08/01/19 0458 08/03/19 0501 08/04/19 0814  WBC 6.2 7.1 6.8 5.3  NEUTROABS 4.6  --   --   --   HGB 10.2* 11.5* 10.2* 10.8*  HCT 33.1* 36.4 32.5* 33.5*  MCV 93.2 92.4 91.8 90.5  PLT 240 301 256 902   Basic Metabolic Panel: Recent Labs  Lab 07/29/19 1221 07/29/19 1421 07/30/19 0227 08/01/19 0458 08/03/19 0501 08/04/19 0814  NA 142  --  140 142 139 136  K 3.3*  --  4.7 3.9 3.9 3.6  CL 99  --  99 99 95* 94*  CO2 30  --  31 30 31 29   GLUCOSE 143*  --  132* 112* 89 266*  BUN 32*  --  32* 40* 44* 43*  CREATININE 1.36*  --  1.32* 1.47* 1.42* 1.49*  CALCIUM 9.0  --  9.3 9.3 9.0 9.4  MG  --  2.2   --   --   --   --    GFR: Estimated Creatinine Clearance: 27.2 mL/min (A) (by C-G formula based on SCr of 1.49 mg/dL (H)). Liver Function Tests: Recent Labs  Lab 07/29/19 1221  AST 13*  ALT 10  ALKPHOS 93  BILITOT 1.3*  PROT 6.0*  ALBUMIN 3.2*   No results for input(s): LIPASE, AMYLASE in the last 168 hours. No results for input(s): AMMONIA in the last 168 hours. Coagulation Profile: No results for input(s): INR, PROTIME in the last 168 hours. Cardiac Enzymes: No results for input(s):  CKTOTAL, CKMB, CKMBINDEX, TROPONINI in the last 168 hours. BNP (last 3 results) No results for input(s): PROBNP in the last 8760 hours. HbA1C: No results for input(s): HGBA1C in the last 72 hours. CBG: Recent Labs  Lab 08/02/19 0606  GLUCAP 116*   Lipid Profile: No results for input(s): CHOL, HDL, LDLCALC, TRIG, CHOLHDL, LDLDIRECT in the last 72 hours. Thyroid Function Tests: No results for input(s): TSH, T4TOTAL, FREET4, T3FREE, THYROIDAB in the last 72 hours. Anemia Panel: No results for input(s): VITAMINB12, FOLATE, FERRITIN, TIBC, IRON, RETICCTPCT in the last 72 hours. Sepsis Labs: Recent Labs  Lab 07/30/19 0805  PROCALCITON <0.10    Recent Results (from the past 240 hour(s))  SARS Coronavirus 2 by RT PCR (hospital order, performed in [facility] hospital lab) Nasopharyngeal Nasopharyngeal Swab     Status: None   Collection Time: 07/29/19  2:53 PM   Specimen: Nasopharyngeal Swab  Result Value Ref Range Status   SARS Coronavirus 2 NEGATIVE NEGATIVE Final    Comment: (NOTE) SARS-CoV-2 target nucleic acids are NOT DETECTED. The SARS-CoV-2 RNA is generally detectable in upper and lower respiratory specimens during the acute phase of infection. The lowest concentration of SARS-CoV-2 viral copies this assay can detect is 250 copies / mL. A negative result does not preclude SARS-CoV-2 infection and should not be used as the sole basis for treatment or other patient management  decisions.  A negative result may occur with improper specimen collection / handling, submission of specimen other than nasopharyngeal swab, presence of viral mutation(s) within the areas targeted by this assay, and inadequate number of viral copies (<250 copies / mL). A negative result must be combined with clinical observations, patient history, and epidemiological information. Fact Sheet for Patients:   StrictlyIdeas.no Fact Sheet for Healthcare Providers: BankingDealers.co.za This test is not yet approved or cleared  by the [place] FDA and has been authorized for detection and/or diagnosis of SARS-CoV-2 by FDA under an Emergency Use Authorization (EUA).  This EUA will remain in effect (meaning this test can be used) for the duration of the COVID-19 declaration under Section 564(b)(1) of the Act, 21 U.S.C. section 360bbb-3(b)(1), unless the authorization is terminated or revoked sooner. Performed at [facility], [place] [address] [place], [place] [address]       Radiology Studies: No results found.  Scheduled Meds: . acidophilus  1 capsule Oral Daily  . albuterol  2.5 mg Nebulization BID  . aspirin EC  81 mg Oral Daily  . budesonide  0.5 mg Nebulization BID  . carvedilol  3.125 mg Oral BID WC  . ferrous sulfate  324 mg Oral Q breakfast  . fluticasone  2 spray Each Nare Daily  . fluticasone furoate-vilanterol  1 puff Inhalation Daily  . furosemide  80 mg Oral q AM  . isosorbide mononitrate  15 mg Oral Daily  . methylPREDNISolone (SOLU-MEDROL) injection  60 mg Intravenous Q8H  . pantoprazole  40 mg Oral QAC breakfast  . sertraline  50 mg Oral Daily  . umeclidinium bromide  1 puff Inhalation Daily  . Vitamin D (Ergocalciferol)  50,000 Units Oral Q Tue   Continuous Infusions:   LOS: 5 days   Time spent: 28 minutes   Darliss Cheney, MD [facility]  08/04/2019, 11:19 AM   To contact the attending provider  between 7A-7P or the covering provider during after hours 7P-7A, please log into the web site [email or web address].

## 2019-08-04 NOTE — Progress Notes (Signed)
PT Cancellation Note  Patient Details Name: Brandi Gardner MRN: [id number] DOB: Jan 08, 1935   Cancelled Treatment:    Reason Eval/Treat Not Completed: Other (comment) Pt reports she has been performing bed exercises (and demonstrated) and walking to bathroom.  Declined further ambulation.  Encouraged/educated on PT role in progress gait and safety, possibly working on balance, but pt politely declined.  Will f/u as able. Pt reports she is expecting to d/c tomorrow evening. Maggie Font, PT Acute Rehab Services Pager [phone] [facility] [phone] [facility] [phone]   Karlton Lemon 08/04/2019, 5:46 PM

## 2019-08-04 NOTE — Progress Notes (Signed)
   08/04/19 1345  Clinical Encounter Type  Visited With Patient  Visit Type Spiritual support;Follow-up  Referral From Palliative care team  Consult/Referral To Chaplain  This chaplain was pastorally present with the Pt. for a F/U visit.  The Pt. informs the chaplain her anxiety is better with Xanax. The chaplain finds the Pt. family adds meaning to the Pt. life through the Pt. gift of story telling. The Pt. shares she is planning to go home, maybe tomorrow with assistance from care givers and [place].  The Pt. describes home as a place she can listen to all types of music.  The chaplain leaves with the Pt. recommendation to listen to [employer] "Someday".  F/U spiritual care is available as needed.

## 2019-08-05 ENCOUNTER — Inpatient Hospital Stay (HOSPITAL_COMMUNITY): Payer: Medicare Other

## 2019-08-05 LAB — GLUCOSE, CAPILLARY: Glucose-Capillary: 176 mg/dL — ABNORMAL HIGH (ref 70–99)

## 2019-08-05 MED ORDER — ISOSORBIDE MONONITRATE ER 30 MG PO TB24: 15.0000 mg | ORAL_TABLET | Freq: Every day | ORAL | 0 refills | Status: AC

## 2019-08-05 MED ORDER — VITAMIN D (ERGOCALCIFEROL) 1.25 MG (50000 UNIT) PO CAPS: 50000.0000 [IU] | ORAL_CAPSULE | ORAL | 0 refills | Status: AC

## 2019-08-05 MED ORDER — BENZONATATE 200 MG PO CAPS: 200.0000 mg | ORAL_CAPSULE | Freq: Three times a day (TID) | ORAL | 0 refills | Status: AC | PRN

## 2019-08-05 MED ORDER — DIPHENHYDRAMINE HCL 25 MG PO CAPS: 25.0000 mg | ORAL_CAPSULE | Freq: Four times a day (QID) | ORAL | 0 refills | Status: AC | PRN

## 2019-08-05 MED ORDER — PREDNISONE 50 MG PO TABS: 50.0000 mg | ORAL_TABLET | Freq: Every day | ORAL | 0 refills | Status: AC

## 2019-08-05 NOTE — Progress Notes (Addendum)
Patient called RN to complain of not feeling well, jittering and  Chest pressure. Vital signs taken stable.BP 138/74 (BP Location: Right Arm)   Pulse 87   Temp 97.7 F (36.5 C) (Oral)   Resp 15   Ht 5\' 4"  (1.626 m)   Wt 65.8 kg   SpO2 99%   BMI 24.89 kg/m  and CBG taken was 176.   Offered her prn  Morphine solution, but refused. Reassured her and made her comfortable in bed. Will keep monitoring.

## 2019-08-05 NOTE — TOC Progression Note (Signed)
Transition of Care [facility]) - Progression Note    Patient Details  Name: Brandi Gardner MRN: [id number] Date of Birth: 1934-10-19  Transition of Care [facility] CM/SW Contact  Jacalyn Lefevre Edson Snowball, RN Phone Number: 08/05/2019, 10:43 AM  Clinical Narrative:     Patient for discharge today. Has family to provide transport home with her portable oxygen tank.   Cory with [facility] aware discharge is today.   Left message with [facility] ( Palliative Care) regarding discharge.  Expected Discharge Plan: [place] Barriers to Discharge: Continued Medical Work up  Expected Discharge Plan and Services Expected Discharge Plan: [place]   Discharge Planning Services: CM Consult Post Acute Care Choice: [place] arrangements for the past 2 months: Apartment Expected Discharge Date: 08/05/19               DME Arranged: N/A DME Agency: NA       HH Arranged: PT, RN, Social Work [employer] Agency: [place] Date [facility] Agency Contacted: 08/01/19 Time [place]: 1105 Representative spoke with at [place]: [place] ([place]) Interventions    Readmission Risk Interventions No flowsheet data found.

## 2019-08-05 NOTE — Discharge Summary (Signed)
Physician Discharge Summary  Brandi Acres BJS:[id number] DOB: 04-Apr-1934 DOA: 07/29/2019  PCP: Cyndi Bender, PA-C  Admit date: 07/29/2019 Discharge date: 08/05/2019  Admitted From: Home Disposition: Home  Recommendations for Outpatient Follow-up:  1. Follow up with PCP in 1-2 weeks 2. Follow with cardiology in 4 weeks 3. Please obtain BMP/CBC in one week 4. Please follow up on the following pending results:  Home Health: Yes Equipment/Devices: None  Discharge Condition: Stable CODE STATUS: Full code Diet recommendation: Cardiac  Subjective: Seen and examined.  She tells me that she feels much better today.  No more chest pain or any worsening shortness of breath.  She is ready to go home today.  Brief/Interim Summary: Brandi Milesis an 85 y.o.femalewith medical history significantofchronic systolic CHF status post AICD, CAD status post CABG, COPD, chronic respiratory failure oncontinuous2 L home oxygen, history of PE currently not on anticoagulation, hypertension, hyperlipidemia, severe anxietypresented with new onset chest pain and worsening of breathing symptoms. Her symptoms started about 1 week ago, started to have wheezing, she feels like she has chest congestion but cannot cough up anything,denied any fever chills. For the last 3-4 morning she started to have episodic chest pains, usually happened after she woke up, localized midsternal, last for a few minutes, sometimes associated with worsening breathing symptoms.  On the morning of admission, the pain radiated to bilateral fingertips and she feels clammy which resembles similar chest pain she had years ago when she had her attacks. Chest pain last 20 to 30 minutes,resolved after given nitroglycerin.Her last stress test/cath was in 2007.  Upon arrival to ED, she was hemodynamically stable.  Troponin x2 were negative.  EKG did not show any acute ST-T wave changes.  X-ray showed mild right-sided pleural effusion.  She  was admitted to hospital service for chest pain and acute COPD exacerbation with chronic hypoxic respiratory failure.  She was started on prednisone, DuoNeb and her home medications were resumed.  She was ruled out of MI with negative cardiac enzymes.  Cardiology was consulted.  Since nitroglycerin relieved her chest pain so she was added Imdur 30 mg daily and since then although initially her chest pain improved and the frequency also improved but she still had some complaint of chest pressure.  Cardiology signed off stating that she does not need any further intervention as her chest pain does not seem to be cardiac.  Today for the first time she had no complaint of chest pain or shortness of breath.  She was wheezy 2 days ago on my examination when I first started taking care of this patient taking charge from my previous hospitalist colleague.  She was already on prednisone 40 mg daily.  I switched her to Solu-Medrol IV and in last 48 hours, her wheezing has improved significantly.  Her rest of the medical issues remained stable.  She is feeling better today and she is willing to go home.  She was seen by PT OT and they recommended home health PT which has been arranged for her.  She is being discharged in stable condition.  I have prescribed her 3 more days of oral prednisone in addition to Imdur.  Discharge Diagnoses:  Active Problems:   Angina pectoris (HCC)   Chest pain with high risk of acute coronary syndrome   Palliative care by specialist   Goals of care, counseling/discussion   Chest pain   Shortness of breath    Discharge Instructions   Allergies as of 08/05/2019  Reactions   Diphenhydramine Shortness Of Breath, Rash, Other (See Comments)   Has COPD, also   Doxycycline Itching   Hydrocodone Shortness Of Breath, Itching, Rash   Oxycodone Hives, Swelling, Other (See Comments)   Percocet = Throat Swelling caused her to go to ER    Penicillins Other (See Comments)   Headache,  childhood allergy   Potassium-containing Compounds Other (See Comments)   Mouth/jaw pain   Tramadol Itching, Swelling, Rash, Other (See Comments)   Rash and Throat swelling caused her to go to ER   Hydrocodone-acetaminophen Hives   Other Rash, Other (See Comments)   Oxygen tubing- BREAKS OUT THE SKIN IN RED, RAISED PATCHES   Percocet [oxycodone-acetaminophen] Hives   Vicodin [hydrocodone-acetaminophen] Hives   Potassium Other (See Comments)   Pain in the mouth/jaws   Statins Nausea Only      Medication List    STOP taking these medications   azithromycin 250 MG tablet Commonly known as: ZITHROMAX   D3-50 1.25 MG (50000 UT) capsule Generic drug: Cholecalciferol Replaced by: Vitamin D (Ergocalciferol) 1.25 MG (50000 UNIT) Caps capsule   EQ ALLERGY & SINUS HEADACHE PO Replaced by: diphenhydrAMINE 25 mg capsule     TAKE these medications   acetaminophen 500 MG tablet Commonly known as: TYLENOL Take 500 mg by mouth every 6 (six) hours as needed for mild pain or headache.   albuterol (2.5 MG/3ML) 0.083% nebulizer solution Commonly known as: PROVENTIL Take 2.5 mg by nebulization in the morning and at bedtime.   ProAir HFA 108 (90 Base) MCG/ACT inhaler Generic drug: albuterol Inhale 2 puffs into the lungs every 6 (six) hours as needed for wheezing or shortness of breath.   ALPRAZolam 1 MG tablet Commonly known as: XANAX Take 1 tablet (1 mg total) by mouth 2 (two) times daily as needed for anxiety. What changed: when to take this   aspirin EC 81 MG tablet Take 81 mg by mouth daily.   benzonatate 200 MG capsule Commonly known as: TESSALON Take 1 capsule (200 mg total) by mouth 3 (three) times daily as needed for cough.   Breo Ellipta 100-25 MCG/INH Aepb Generic drug: fluticasone furoate-vilanterol Inhale 1 puff into the lungs daily.   budesonide 0.5 MG/2ML nebulizer solution Commonly known as: PULMICORT Take 0.5 mg by nebulization in the morning and at bedtime.    carvedilol 3.125 MG tablet Commonly known as: COREG Take 3.125 mg by mouth 2 (two) times daily with a meal.   diphenhydrAMINE 25 mg capsule Commonly known as: BENADRYL Take 1 capsule (25 mg total) by mouth every 6 (six) hours as needed (for sinus symptoms/congestion). Replaces: EQ ALLERGY & SINUS HEADACHE PO   ferrous sulfate 324 (65 Fe) MG Tbec Take 324 mg by mouth daily with breakfast.   fluticasone 50 MCG/ACT nasal spray Commonly known as: FLONASE Place 2 sprays into both nostrils daily.   furosemide 40 MG tablet Commonly known as: LASIX Take 2 tablets (80 mg total) by mouth in the morning.   isosorbide mononitrate 30 MG 24 hr tablet Commonly known as: IMDUR Take 0.5 tablets (15 mg total) by mouth daily. Start taking on: Aug 06, 2019   nitroGLYCERIN 0.4 MG SL tablet Commonly known as: NITROSTAT Place 0.4 mg under the tongue every 5 (five) minutes as needed for chest pain.   nystatin-triamcinolone cream Commonly known as: MYCOLOG II Apply 1 application topically 2 (two) times daily as needed (for yeast).   OXYGEN Inhale 2 L/min into the lungs continuous.  pantoprazole 40 MG tablet Commonly known as: PROTONIX Take 40 mg by mouth daily before breakfast.   predniSONE 50 MG tablet Commonly known as: DELTASONE Take 1 tablet (50 mg total) by mouth daily with breakfast for 3 days.   Probiotic Caps Take 1 capsule by mouth daily.   sertraline 50 MG tablet Commonly known as: ZOLOFT Take 50 mg by mouth daily.   tiotropium 18 MCG inhalation capsule Commonly known as: SPIRIVA Place 18 mcg into inhaler and inhale daily.   tiZANidine 4 MG tablet Commonly known as: ZANAFLEX Take 4 mg by mouth every 8 (eight) hours as needed (for back pain).   Vitamin D (Ergocalciferol) 1.25 MG (50000 UNIT) Caps capsule Commonly known as: DRISDOL Take 1 capsule (50,000 Units total) by mouth every Tuesday. Start taking on: August 09, 2019 Replaces: D3-50 1.25 MG (50000 UT) capsule       Follow-up Information    [facility] Follow up.   Specialty: PALLIATIVE CARE Why: Palliative Care  Contact information: [address] [place] [address] [place], [facility] Follow up.   Specialty: Home Health Services Why: home health  Contact information: [place] [address] [phone]        Cyndi Bender, PA-C Follow up in 1 week(s).   Specialty: Physician Assistant Contact information: [place] [address] [phone]        Knute Neu., MD .   Specialty: Cardiology Contact information: [address] [place] [address] [phone]        Edward Qualia, [place] .   Specialty: Cardiology Contact information: [address] [place] [address] [phone]          Allergies  Allergen Reactions  . Diphenhydramine Shortness Of Breath, Rash and Other (See Comments)    Has COPD, also   . Doxycycline Itching  . Hydrocodone Shortness Of Breath, Itching and Rash  . Oxycodone Hives, Swelling and Other (See Comments)    Percocet = Throat Swelling caused her to go to ER     . Penicillins Other (See Comments)    Headache, childhood allergy   . Potassium-Containing Compounds Other (See Comments)    Mouth/jaw pain  . Tramadol Itching, Swelling, Rash and Other (See Comments)    Rash and Throat swelling caused her to go to ER   . Hydrocodone-Acetaminophen Hives  . Other Rash and Other (See Comments)    Oxygen tubing- BREAKS OUT THE SKIN IN RED, RAISED PATCHES  . Percocet [Oxycodone-Acetaminophen] Hives  . Vicodin [Hydrocodone-Acetaminophen] Hives  . Potassium Other (See Comments)    Pain in the mouth/jaws  . Statins Nausea Only    Consultations: Cardiology   Procedures/Studies: DG Chest 2 View  Result Date: 07/29/2019 CLINICAL DATA:  Chest pain and shortness of breath EXAM: CHEST - 2 VIEW  COMPARISON:  June 27, 2019 FINDINGS: There is a small pleural effusion on the right. There is patchy airspace opacity in the right base. There is slight left base atelectasis. Heart size and pulmonary vascularity are within normal limits. Pacemaker lead is attached to the right ventricular apex. Patient is status post coronary artery bypass grafting. No adenopathy. No bone lesions. IMPRESSION: Fairly small right pleural effusion with suspected superimposed pneumonia and atelectasis in the right base. Slight left base atelectasis. Left lung otherwise clear. Heart size within normal limits. Postoperative changes noted. No  adenopathy evident. Electronically Signed   By: Lowella Grip III M.D.   On: 07/29/2019 12:57   DG CHEST PORT 1 VIEW  Result Date: 07/31/2019 CLINICAL DATA:  Sudden onset of chest pain and dyspnea EXAM: PORTABLE CHEST 1 VIEW COMPARISON:  07/30/2019 FINDINGS: Cardiac shadow is stable. Postsurgical changes are noted. Defibrillator is again seen and stable. Right-sided pleural effusion is noted with right basilar infiltrates stable from the prior exam. Coarsened interstitial markings are noted bilaterally also stable from the prior exam. No new focal abnormality is noted. IMPRESSION: Stable appearance when compared with the previous day. Right effusion and infiltrate is noted. Electronically Signed   By: Inez Catalina M.D.   On: 07/31/2019 10:09   DG CHEST PORT 1 VIEW  Result Date: 07/30/2019 CLINICAL DATA:  85 year old female with a history of congestive heart failure EXAM: PORTABLE CHEST 1 VIEW COMPARISON:  07/29/2019, 06/27/2019 FINDINGS: Cardiomediastinal silhouette unchanged in size and contour. Surgical changes of median sternotomy and CABG. Unchanged left chest wall cardiac pacing device/AICD. Similar appearance of opacity at the right lung base obscuring the right hemidiaphragm and the right heart border. Left lung base relatively unremarkable. Coarsened interstitial markings  bilaterally. IMPRESSION: Unchanged opacity at the right lung base, potentially combination of atelectasis/consolidation and pleural fluid. Chronic lung changes with mild edema. Surgical changes of median sternotomy and CABG. Unchanged cardiac AICD. Electronically Signed   By: Corrie Mckusick D.O.   On: 07/30/2019 11:21     Discharge Exam: Vitals:   08/05/19 0833 08/05/19 0858  BP: (!) 146/67   Pulse: 91   Resp:    Temp:    SpO2:  98%   Vitals:   08/05/19 0417 08/05/19 0552 08/05/19 0833 08/05/19 0858  BP: 138/74 (!) 144/77 (!) 146/67   Pulse: 87 88 91   Resp: 15 16    Temp:  98 F (36.7 C)    TempSrc:  Oral    SpO2: 99% 100%  98%  Weight:      Height:        General: Pt is alert, awake, not in acute distress Cardiovascular: RRR, S1/S2 +, no rubs, no gallops Respiratory: CTA but diminished breath sounds bilaterally, no wheezing, no rhonchi Abdominal: Soft, NT, ND, bowel sounds + Extremities: no edema, no cyanosis    The results of significant diagnostics from this hospitalization (including imaging, microbiology, ancillary and laboratory) are listed below for reference.     Microbiology: Recent Results (from the past 240 hour(s))  SARS Coronavirus 2 by RT PCR (hospital order, performed in [facility] hospital lab) Nasopharyngeal Nasopharyngeal Swab     Status: None   Collection Time: 07/29/19  2:53 PM   Specimen: Nasopharyngeal Swab  Result Value Ref Range Status   SARS Coronavirus 2 NEGATIVE NEGATIVE Final    Comment: (NOTE) SARS-CoV-2 target nucleic acids are NOT DETECTED. The SARS-CoV-2 RNA is generally detectable in upper and lower respiratory specimens during the acute phase of infection. The lowest concentration of SARS-CoV-2 viral copies this assay can detect is 250 copies / mL. A negative result does not preclude SARS-CoV-2 infection and should not be used as the sole basis for treatment or other patient management decisions.  A negative result may occur  with improper specimen collection / handling, submission of specimen other than nasopharyngeal swab, presence of viral mutation(s) within the areas targeted by this assay, and inadequate number of viral copies (<250 copies / mL). A negative result must be combined with clinical observations, patient history, and  epidemiological information. Fact Sheet for Patients:   StrictlyIdeas.no Fact Sheet for Healthcare Providers: BankingDealers.co.za This test is not yet approved or cleared  by the [place] FDA and has been authorized for detection and/or diagnosis of SARS-CoV-2 by FDA under an Emergency Use Authorization (EUA).  This EUA will remain in effect (meaning this test can be used) for the duration of the COVID-19 declaration under Section 564(b)(1) of the Act, 21 U.S.C. section 360bbb-3(b)(1), unless the authorization is terminated or revoked sooner. Performed at [facility], [place] [address], [place], [place] [address]      Labs: BNP (last 3 results) Recent Labs    06/27/19 1734 07/29/19 1221  BNP 446.8* 673.4*   Basic Metabolic Panel: Recent Labs  Lab 07/29/19 1221 07/29/19 1421 07/30/19 0227 08/01/19 0458 08/03/19 0501 08/04/19 0814  NA 142  --  140 142 139 136  K 3.3*  --  4.7 3.9 3.9 3.6  CL 99  --  99 99 95* 94*  CO2 30  --  31 30 31 29   GLUCOSE 143*  --  132* 112* 89 266*  BUN 32*  --  32* 40* 44* 43*  CREATININE 1.36*  --  1.32* 1.47* 1.42* 1.49*  CALCIUM 9.0  --  9.3 9.3 9.0 9.4  MG  --  2.2  --   --   --   --    Liver Function Tests: Recent Labs  Lab 07/29/19 1221  AST 13*  ALT 10  ALKPHOS 93  BILITOT 1.3*  PROT 6.0*  ALBUMIN 3.2*   No results for input(s): LIPASE, AMYLASE in the last 168 hours. No results for input(s): AMMONIA in the last 168 hours. CBC: Recent Labs  Lab 07/29/19 1221 08/01/19 0458 08/03/19 0501 08/04/19 0814  WBC 6.2 7.1 6.8 5.3  NEUTROABS 4.6  --   --   --    HGB 10.2* 11.5* 10.2* 10.8*  HCT 33.1* 36.4 32.5* 33.5*  MCV 93.2 92.4 91.8 90.5  PLT 240 301 256 259   Cardiac Enzymes: No results for input(s): CKTOTAL, CKMB, CKMBINDEX, TROPONINI in the last 168 hours. BNP: Invalid input(s): POCBNP CBG: Recent Labs  Lab 08/02/19 0606 08/05/19 0416  GLUCAP 116* 176*   D-Dimer No results for input(s): DDIMER in the last 72 hours. Hgb A1c No results for input(s): HGBA1C in the last 72 hours. Lipid Profile No results for input(s): CHOL, HDL, LDLCALC, TRIG, CHOLHDL, LDLDIRECT in the last 72 hours. Thyroid function studies No results for input(s): TSH, T4TOTAL, T3FREE, THYROIDAB in the last 72 hours.  Invalid input(s): FREET3 Anemia work up No results for input(s): VITAMINB12, FOLATE, FERRITIN, TIBC, IRON, RETICCTPCT in the last 72 hours. Urinalysis    Component Value Date/Time   COLORURINE STRAW (A) 06/27/2019 2333   APPEARANCEUR CLEAR 06/27/2019 2333   LABSPEC 1.006 06/27/2019 2333   PHURINE 5.0 06/27/2019 2333   GLUCOSEU NEGATIVE 06/27/2019 2333   HGBUR NEGATIVE 06/27/2019 2333   BILIRUBINUR NEGATIVE 06/27/2019 [place] 06/27/2019 2333   PROTEINUR NEGATIVE 06/27/2019 2333   NITRITE NEGATIVE 06/27/2019 2333   LEUKOCYTESUR NEGATIVE 06/27/2019 2333   Sepsis Labs Invalid input(s): PROCALCITONIN,  WBC,  LACTICIDVEN Microbiology Recent Results (from the past 240 hour(s))  SARS Coronavirus 2 by RT PCR (hospital order, performed in [facility] hospital lab) Nasopharyngeal Nasopharyngeal Swab     Status: None   Collection Time: 07/29/19  2:53 PM   Specimen: Nasopharyngeal Swab  Result Value Ref Range Status  SARS Coronavirus 2 NEGATIVE NEGATIVE Final    Comment: (NOTE) SARS-CoV-2 target nucleic acids are NOT DETECTED. The SARS-CoV-2 RNA is generally detectable in upper and lower respiratory specimens during the acute phase of infection. The lowest concentration of SARS-CoV-2 viral copies this assay can detect is  250 copies / mL. A negative result does not preclude SARS-CoV-2 infection and should not be used as the sole basis for treatment or other patient management decisions.  A negative result may occur with improper specimen collection / handling, submission of specimen other than nasopharyngeal swab, presence of viral mutation(s) within the areas targeted by this assay, and inadequate number of viral copies (<250 copies / mL). A negative result must be combined with clinical observations, patient history, and epidemiological information. Fact Sheet for Patients:   StrictlyIdeas.no Fact Sheet for Healthcare Providers: BankingDealers.co.za This test is not yet approved or cleared  by the [place] FDA and has been authorized for detection and/or diagnosis of SARS-CoV-2 by FDA under an Emergency Use Authorization (EUA).  This EUA will remain in effect (meaning this test can be used) for the duration of the COVID-19 declaration under Section 564(b)(1) of the Act, 21 U.S.C. section 360bbb-3(b)(1), unless the authorization is terminated or revoked sooner. Performed at [facility], [place] [address], [place], [place] [address]      Time coordinating discharge: Over 30 minutes  SIGNED:   Darliss Cheney, MD  [facility] 08/05/2019, 9:52 AM  If 7PM-7AM, please contact night-coverage [email or web address]

## 2019-08-05 NOTE — Progress Notes (Signed)
Nsg Discharge Note  Patient discharged home with [facility] services arranged. Son-in-law provided transportation.   Admit Date:  07/29/2019 Discharge date: 08/05/2019   Brandi Gardner to be D/C'd Home per MD order.  AVS completed.  Copy for chart, and copy for patient signed, and dated. Patient/caregiver able to verbalize understanding.  Discharge Medication: Allergies as of 08/05/2019      Reactions   Diphenhydramine Shortness Of Breath, Rash, Other (See Comments)   Has COPD, also   Doxycycline Itching   Hydrocodone Shortness Of Breath, Itching, Rash   Oxycodone Hives, Swelling, Other (See Comments)   Percocet = Throat Swelling caused her to go to ER    Penicillins Other (See Comments)   Headache, childhood allergy   Potassium-containing Compounds Other (See Comments)   Mouth/jaw pain   Tramadol Itching, Swelling, Rash, Other (See Comments)   Rash and Throat swelling caused her to go to ER   Hydrocodone-acetaminophen Hives   Other Rash, Other (See Comments)   Oxygen tubing- BREAKS OUT THE SKIN IN RED, RAISED PATCHES   Percocet [oxycodone-acetaminophen] Hives   Vicodin [hydrocodone-acetaminophen] Hives   Potassium Other (See Comments)   Pain in the mouth/jaws   Statins Nausea Only      Medication List    STOP taking these medications   azithromycin 250 MG tablet Commonly known as: ZITHROMAX   D3-50 1.25 MG (50000 UT) capsule Generic drug: Cholecalciferol Replaced by: Vitamin D (Ergocalciferol) 1.25 MG (50000 UNIT) Caps capsule   EQ ALLERGY & SINUS HEADACHE PO Replaced by: diphenhydrAMINE 25 mg capsule     TAKE these medications   acetaminophen 500 MG tablet Commonly known as: TYLENOL Take 500 mg by mouth every 6 (six) hours as needed for mild pain or headache.   albuterol (2.5 MG/3ML) 0.083% nebulizer solution Commonly known as: PROVENTIL Take 2.5 mg by nebulization in the morning and at bedtime.   ProAir HFA 108 (90 Base) MCG/ACT inhaler Generic drug:  albuterol Inhale 2 puffs into the lungs every 6 (six) hours as needed for wheezing or shortness of breath.   ALPRAZolam 1 MG tablet Commonly known as: XANAX Take 1 tablet (1 mg total) by mouth 2 (two) times daily as needed for anxiety. What changed: when to take this   aspirin EC 81 MG tablet Take 81 mg by mouth daily.   benzonatate 200 MG capsule Commonly known as: TESSALON Take 1 capsule (200 mg total) by mouth 3 (three) times daily as needed for cough.   Breo Ellipta 100-25 MCG/INH Aepb Generic drug: fluticasone furoate-vilanterol Inhale 1 puff into the lungs daily.   budesonide 0.5 MG/2ML nebulizer solution Commonly known as: PULMICORT Take 0.5 mg by nebulization in the morning and at bedtime.   carvedilol 3.125 MG tablet Commonly known as: COREG Take 3.125 mg by mouth 2 (two) times daily with a meal.   diphenhydrAMINE 25 mg capsule Commonly known as: BENADRYL Take 1 capsule (25 mg total) by mouth every 6 (six) hours as needed (for sinus symptoms/congestion). Replaces: EQ ALLERGY & SINUS HEADACHE PO   ferrous sulfate 324 (65 Fe) MG Tbec Take 324 mg by mouth daily with breakfast.   fluticasone 50 MCG/ACT nasal spray Commonly known as: FLONASE Place 2 sprays into both nostrils daily.   furosemide 40 MG tablet Commonly known as: LASIX Take 2 tablets (80 mg total) by mouth in the morning.   isosorbide mononitrate 30 MG 24 hr tablet Commonly known as: IMDUR Take 0.5 tablets (15 mg total) by mouth daily. Start  taking on: Aug 06, 2019   nitroGLYCERIN 0.4 MG SL tablet Commonly known as: NITROSTAT Place 0.4 mg under the tongue every 5 (five) minutes as needed for chest pain.   nystatin-triamcinolone cream Commonly known as: MYCOLOG II Apply 1 application topically 2 (two) times daily as needed (for yeast).   OXYGEN Inhale 2 L/min into the lungs continuous.   pantoprazole 40 MG tablet Commonly known as: PROTONIX Take 40 mg by mouth daily before breakfast.    predniSONE 50 MG tablet Commonly known as: DELTASONE Take 1 tablet (50 mg total) by mouth daily with breakfast for 3 days.   Probiotic Caps Take 1 capsule by mouth daily.   sertraline 50 MG tablet Commonly known as: ZOLOFT Take 50 mg by mouth daily.   tiotropium 18 MCG inhalation capsule Commonly known as: SPIRIVA Place 18 mcg into inhaler and inhale daily.   tiZANidine 4 MG tablet Commonly known as: ZANAFLEX Take 4 mg by mouth every 8 (eight) hours as needed (for back pain).   Vitamin D (Ergocalciferol) 1.25 MG (50000 UNIT) Caps capsule Commonly known as: DRISDOL Take 1 capsule (50,000 Units total) by mouth every Tuesday. Start taking on: August 09, 2019 Replaces: D3-50 1.25 MG (50000 UT) capsule       Discharge Assessment: Vitals:   08/05/19 0833 08/05/19 0858  BP: (!) 146/67   Pulse: 91   Resp:    Temp:    SpO2:  98%   Skin clean, dry and intact without evidence of skin break down, no evidence of skin tears noted. IV catheter discontinued intact. Site without signs and symptoms of complications - no redness or edema noted at insertion site, patient denies c/o pain - only slight tenderness at site.  Dressing with slight pressure applied.  D/c Instructions-Education: Discharge instructions given to patient/family with verbalized understanding. D/c education completed with patient/family including follow up instructions, medication list, d/c activities limitations if indicated, with other d/c instructions as indicated by MD - patient able to verbalize understanding, all questions fully answered. Patient instructed to return to ED, call 911, or call MD for any changes in condition.  Patient escorted via [place], and D/C home via private auto.  Erasmo Leventhal, RN 08/05/2019 12:07 PM

## 2019-08-05 NOTE — Discharge Instructions (Signed)

## 2019-08-09 DIAGNOSIS — Z951 Presence of aortocoronary bypass graft: Secondary | ICD-10-CM | POA: Diagnosis not present

## 2019-08-09 DIAGNOSIS — J449 Chronic obstructive pulmonary disease, unspecified: Secondary | ICD-10-CM | POA: Diagnosis not present

## 2019-08-09 DIAGNOSIS — R002 Palpitations: Secondary | ICD-10-CM | POA: Diagnosis not present

## 2019-08-09 DIAGNOSIS — Z95810 Presence of automatic (implantable) cardiac defibrillator: Secondary | ICD-10-CM | POA: Diagnosis not present

## 2019-08-09 DIAGNOSIS — Z7689 Persons encountering health services in other specified circumstances: Secondary | ICD-10-CM | POA: Diagnosis not present

## 2019-08-09 DIAGNOSIS — I255 Ischemic cardiomyopathy: Secondary | ICD-10-CM | POA: Diagnosis not present

## 2019-08-09 DIAGNOSIS — I2510 Atherosclerotic heart disease of native coronary artery without angina pectoris: Secondary | ICD-10-CM | POA: Diagnosis not present

## 2019-08-09 DIAGNOSIS — I5022 Chronic systolic (congestive) heart failure: Secondary | ICD-10-CM | POA: Diagnosis not present

## 2019-08-14 ENCOUNTER — Other Ambulatory Visit: Payer: Self-pay

## 2019-08-14 ENCOUNTER — Encounter (HOSPITAL_COMMUNITY): Payer: Self-pay | Admitting: Emergency Medicine

## 2019-08-14 ENCOUNTER — Emergency Department (HOSPITAL_COMMUNITY)
Admission: EM | Admit: 2019-08-14 | Discharge: 2019-08-15 | Disposition: A | Discharge status: Home / Self Care | Payer: Medicare Other | Attending: Emergency Medicine | Admitting: Emergency Medicine

## 2019-08-14 DIAGNOSIS — R339 Retention of urine, unspecified: Secondary | ICD-10-CM | POA: Diagnosis not present

## 2019-08-14 DIAGNOSIS — Z951 Presence of aortocoronary bypass graft: Secondary | ICD-10-CM | POA: Insufficient documentation

## 2019-08-14 DIAGNOSIS — J449 Chronic obstructive pulmonary disease, unspecified: Secondary | ICD-10-CM | POA: Insufficient documentation

## 2019-08-14 DIAGNOSIS — R82998 Other abnormal findings in urine: Secondary | ICD-10-CM | POA: Diagnosis not present

## 2019-08-14 DIAGNOSIS — I5022 Chronic systolic (congestive) heart failure: Secondary | ICD-10-CM | POA: Insufficient documentation

## 2019-08-14 DIAGNOSIS — Z7982 Long term (current) use of aspirin: Secondary | ICD-10-CM | POA: Insufficient documentation

## 2019-08-14 DIAGNOSIS — R062 Wheezing: Secondary | ICD-10-CM | POA: Diagnosis not present

## 2019-08-14 DIAGNOSIS — R319 Hematuria, unspecified: Secondary | ICD-10-CM | POA: Insufficient documentation

## 2019-08-14 DIAGNOSIS — I2510 Atherosclerotic heart disease of native coronary artery without angina pectoris: Secondary | ICD-10-CM | POA: Diagnosis not present

## 2019-08-14 DIAGNOSIS — R0602 Shortness of breath: Secondary | ICD-10-CM | POA: Diagnosis not present

## 2019-08-14 DIAGNOSIS — Z87891 Personal history of nicotine dependence: Secondary | ICD-10-CM | POA: Diagnosis not present

## 2019-08-14 DIAGNOSIS — Z95810 Presence of automatic (implantable) cardiac defibrillator: Secondary | ICD-10-CM | POA: Diagnosis not present

## 2019-08-14 DIAGNOSIS — R069 Unspecified abnormalities of breathing: Secondary | ICD-10-CM | POA: Diagnosis not present

## 2019-08-14 DIAGNOSIS — R52 Pain, unspecified: Secondary | ICD-10-CM | POA: Diagnosis not present

## 2019-08-14 DIAGNOSIS — R300 Dysuria: Secondary | ICD-10-CM | POA: Insufficient documentation

## 2019-08-14 DIAGNOSIS — Z79899 Other long term (current) drug therapy: Secondary | ICD-10-CM | POA: Diagnosis not present

## 2019-08-14 DIAGNOSIS — R451 Restlessness and agitation: Secondary | ICD-10-CM | POA: Diagnosis not present

## 2019-08-14 DIAGNOSIS — M549 Dorsalgia, unspecified: Secondary | ICD-10-CM | POA: Diagnosis not present

## 2019-08-14 DIAGNOSIS — I110 Hypertensive heart disease with heart failure: Secondary | ICD-10-CM | POA: Insufficient documentation

## 2019-08-14 LAB — COMPREHENSIVE METABOLIC PANEL WITH GFR
ALT: 19 U/L (ref 0–44)
AST: 14 U/L — ABNORMAL LOW (ref 15–41)
Albumin: 2.9 g/dL — ABNORMAL LOW (ref 3.5–5.0)
Alkaline Phosphatase: 89 U/L (ref 38–126)
Anion gap: 12 (ref 5–15)
BUN: 20 mg/dL (ref 8–23)
CO2: 31 mmol/L (ref 22–32)
Calcium: 8.5 mg/dL — ABNORMAL LOW (ref 8.9–10.3)
Chloride: 95 mmol/L — ABNORMAL LOW (ref 98–111)
Creatinine, Ser: 1 mg/dL (ref 0.44–1.00)
GFR calc Af Amer: >60 mL/min
GFR calc non Af Amer: 52 mL/min — ABNORMAL LOW
Glucose, Bld: 131 mg/dL — ABNORMAL HIGH (ref 70–99)
Potassium: 3.6 mmol/L (ref 3.5–5.1)
Sodium: 138 mmol/L (ref 135–145)
Total Bilirubin: 0.9 mg/dL (ref 0.3–1.2)
Total Protein: 6 g/dL — ABNORMAL LOW (ref 6.5–8.1)

## 2019-08-14 LAB — CBC
HCT: 34 % — ABNORMAL LOW (ref 36.0–46.0)
Hemoglobin: 10.6 g/dL — ABNORMAL LOW (ref 12.0–15.0)
MCH: 29.4 pg (ref 26.0–34.0)
MCHC: 31.2 g/dL (ref 30.0–36.0)
MCV: 94.2 fL (ref 80.0–100.0)
Platelets: 198 10*3/uL (ref 150–400)
RBC: 3.61 MIL/uL — ABNORMAL LOW (ref 3.87–5.11)
RDW: 14.2 % (ref 11.5–15.5)
WBC: 12.4 10*3/uL — ABNORMAL HIGH (ref 4.0–10.5)
nRBC: 0 % (ref 0.0–0.2)

## 2019-08-14 MED ORDER — SODIUM CHLORIDE 0.9% FLUSH: 3.0000 mL | Freq: Once | INTRAVENOUS | Status: DC

## 2019-08-14 NOTE — ED Triage Notes (Signed)
Per EMS, pt from home c/o trouble urination X2 days.  Pt repots she has only urinated once.  "My pee has a horrible smell."  Was just discharged 5 days ago.  152/80 98% on 3L O2 (home) CBG 112 HR 106

## 2019-08-15 LAB — URINALYSIS, ROUTINE W REFLEX MICROSCOPIC
Bilirubin Urine: NEGATIVE
Glucose, UA: NEGATIVE mg/dL
Ketones, ur: NEGATIVE mg/dL
Nitrite: NEGATIVE
Protein, ur: 100 mg/dL — AB
Specific Gravity, Urine: 1.011 (ref 1.005–1.030)
pH: 5 (ref 5.0–8.0)

## 2019-08-15 MED ORDER — ALPRAZOLAM 0.25 MG PO TABS
1.0000 mg | ORAL_TABLET | Freq: Two times a day (BID) | ORAL | Status: DC | PRN
  Administered 2019-08-15: 1 mg via ORAL
  Filled 2019-08-15: qty 4

## 2019-08-15 MED ORDER — ACETAMINOPHEN 500 MG PO TABS
500.0000 mg | ORAL_TABLET | Freq: Four times a day (QID) | ORAL | Status: DC | PRN
  Administered 2019-08-15: 500 mg via ORAL
  Filled 2019-08-15: qty 1

## 2019-08-15 MED ORDER — ALBUTEROL SULFATE HFA 108 (90 BASE) MCG/ACT IN AERS
2.0000 | INHALATION_SPRAY | Freq: Once | RESPIRATORY_TRACT | Status: AC
  Administered 2019-08-15: 2 via RESPIRATORY_TRACT

## 2019-08-15 MED ORDER — IPRATROPIUM BROMIDE 0.02 % IN SOLN
0.5000 mg | Freq: Once | RESPIRATORY_TRACT | Status: DC
  Filled 2019-08-15: qty 2.5

## 2019-08-15 MED ORDER — ALBUTEROL SULFATE HFA 108 (90 BASE) MCG/ACT IN AERS
2.0000 | INHALATION_SPRAY | Freq: Four times a day (QID) | RESPIRATORY_TRACT | Status: DC | PRN
  Administered 2019-08-15: 2 via RESPIRATORY_TRACT
  Filled 2019-08-15: qty 6.7

## 2019-08-15 MED ORDER — ALBUTEROL SULFATE (2.5 MG/3ML) 0.083% IN NEBU
5.0000 mg | INHALATION_SOLUTION | Freq: Once | RESPIRATORY_TRACT | Status: AC
  Administered 2019-08-15: 5 mg via RESPIRATORY_TRACT
  Filled 2019-08-15: qty 6

## 2019-08-15 MED ORDER — FOSFOMYCIN TROMETHAMINE 3 G PO PACK
3.0000 g | PACK | Freq: Once | ORAL | Status: AC
  Administered 2019-08-15: 3 g via ORAL
  Filled 2019-08-15: qty 3

## 2019-08-15 NOTE — ED Provider Notes (Signed)
[facility] EMER[facility]ENC[facility] DEPARTMENT Provider Note   CSN: [id number] Arrival date & time: 08/14/19  1932     History Chief Complaint  Patient presents with  . Dysuria    Brandi Gardner is a 85 y.o. female.  The history is provided by the patient.  Dysuria Pain severity:  Moderate Onset quality:  Gradual Duration:  3 days Timing:  Intermittent Progression:  Worsening Chronicity:  New Relieved by:  Nothing Worsened by:  Nothing Urinary symptoms: foul-smelling urine   Associated symptoms: no abdominal pain, no fever and no vomiting    Patient with multiple chronic medical conditions including CAD, CHF, ICD present, COPD on home oxygen presents with difficulty urinating.  Patient reports approximately 3 days ago she started having difficulty passing urine.  She reports that urine she is passing is in small amount and foul smelling.  No fevers or vomiting.  Reports chronic back pain.  No new weakness.  No new chest pain or shortness of breath.  She does not think medications are causing her condition    Past Medical History:  Diagnosis Date  . Acute on chronic systolic (congestive) heart failure ([place]) 04/24/2015  . AICD (automatic cardioverter/defibrillator) present   . Anxiety   . Cardiac defibrillator in place 04/25/2015  . CHF (congestive heart failure) ([place])   . Chronic obstructive pulmonary disease with acute exacerbation ([place]) 04/22/2015  . COPD (chronic obstructive pulmonary disease) ([place])   . Coronary artery disease   . Hx of pulmonary embolus 1980  . Hyperlipidemia   . Hypertension   . Ischemic cardiomyopathy 05/24/2015  . S/P CABG (coronary artery bypass graft) 05/24/2015    Patient Active Problem List   Diagnosis Date Noted  . Shortness of breath   . Chest pain 07/30/2019  . Palliative care by specialist   . Goals of care, counseling/discussion   . Angina pectoris ([place]) 07/29/2019  . Chest pain with high risk of acute coronary syndrome 07/29/2019    . Acute exacerbation of CHF (congestive heart failure) ([place]) 06/27/2019  . Chronic respiratory failure with hypoxia ([place]) 06/27/2019  . NSVT (nonsustained ventricular tachycardia) ([place]) 06/27/2019  . COPD (chronic obstructive pulmonary disease) ([place]) 06/27/2019  . AKI (acute kidney injury) ([place]) 06/27/2019  . Acute respiratory failure with hypoxia and hypercapnia ([facility])   . Congestive heart failure (CHF) ([place]) 08/26/2017  . CHF (congestive heart failure) ([place]) 08/26/2017  . Hyperkalemia 10/22/2016  . Mitral regurgitation 10/13/2016  . Hyperlipidemia   . Ischemic cardiomyopathy 05/24/2015  . S/P CABG (coronary artery bypass graft) 05/24/2015  . Cardiac defibrillator in place 04/25/2015  . Acute on chronic systolic (congestive) heart failure ([place]) 04/24/2015  . Chronic obstructive pulmonary disease with acute exacerbation ([place]) 04/22/2015  . Hx of pulmonary embolus 03/10/1978    Past Surgical History:  Procedure Laterality Date  . CARDIAC CATHETERIZATION    . CORONARY ARTERY BYPASS GRAFT    . IR GENERIC HISTORICAL  11/08/2015   IR RADIOLOGIST EVAL & MGMT 11/08/2015 [facility]  . IR GENERIC HISTORICAL  11/13/2015   IR VERTEBROPLASTY LUMBAR BX INC UNI/BIL INC/INJECT/IMAGING 11/13/2015 Luanne Bras, MD [facility]  . IR GENERIC HISTORICAL  12/07/2015   IR RADIOLOGIST EVAL & MGMT 12/07/2015 [facility]  . TUBAL LIGATION       OB History   No obstetric history on file.     Family History  Problem Relation Age of Onset  . Hypertension Mother   . Uterine cancer Mother   . Hypertension Father  Social History   Tobacco Use  . Smoking status: Former Smoker    Quit date: 11/12/1992    Years since quitting: 26.7  . Smokeless tobacco: Never Used  Substance Use Topics  . Alcohol use: No  . Drug use: No    Home Medications Prior to Admission medications   Medication Sig Start Date End Date Taking? Authorizing Provider  acetaminophen (TYLENOL) 500 MG tablet Take 500 mg  by mouth every 6 (six) hours as needed for mild pain or headache.    Yes [provider]  albuterol (PROAIR HFA) 108 (90 Base) MCG/ACT inhaler Inhale 2 puffs into the lungs every 6 (six) hours as needed for wheezing or shortness of breath.   Yes [provider]  albuterol (PROVENTIL) (2.5 MG/3ML) 0.083% nebulizer solution Take 2.5 mg by nebulization in the morning and at bedtime.   Yes [provider]  ALPRAZolam Duanne Moron) 1 MG tablet Take 1 tablet (1 mg total) by mouth 2 (two) times daily as needed for anxiety. Patient taking differently: Take 1 mg by mouth daily.  08/30/17  Yes Guadalupe Dawn, MD  aspirin EC 81 MG tablet Take 81 mg by mouth daily.   Yes [provider]  benzonatate (TESSALON) 200 MG capsule Take 1 capsule (200 mg total) by mouth 3 (three) times daily as needed for cough. 08/05/19  Yes Pahwani, Einar Grad, MD  budesonide (PULMICORT) 0.5 MG/2ML nebulizer solution Take 0.5 mg by nebulization in the morning and at bedtime.   Yes [provider]  carvedilol (COREG) 3.125 MG tablet Take 3.125 mg by mouth 2 (two) times daily with a meal.   Yes [provider]  ferrous sulfate 324 (65 Fe) MG TBEC Take 324 mg by mouth daily with breakfast.  06/09/17  Yes [provider]  fluticasone (FLONASE) 50 MCG/ACT nasal spray Place 2 sprays into both nostrils daily.   Yes [provider]  fluticasone furoate-vilanterol (BREO ELLIPTA) 100-25 MCG/INH AEPB Inhale 1 puff into the lungs daily.   Yes [provider]  furosemide (LASIX) 40 MG tablet Take 2 tablets (80 mg total) by mouth in the morning. 07/01/19  Yes Eulogio Bear U, DO  isosorbide mononitrate (IMDUR) 30 MG 24 hr tablet Take 0.5 tablets (15 mg total) by mouth daily. 08/06/19 09/05/19 Yes Pahwani, Einar Grad, MD  nitroGLYCERIN (NITROSTAT) 0.4 MG SL tablet Place 0.4 mg under the tongue every 5 (five) minutes as needed for chest pain.   Yes [provider]    nystatin-triamcinolone (MYCOLOG II) cream Apply 1 application topically 2 (two) times daily as needed (for yeast).   Yes [provider]  pantoprazole (PROTONIX) 40 MG tablet Take 40 mg by mouth daily before breakfast.  06/09/17  Yes [provider]  Probiotic CAPS Take 1 capsule by mouth daily.   Yes [provider]  sertraline (ZOLOFT) 50 MG tablet Take 50 mg by mouth daily. 07/21/19  Yes [provider]  tiotropium (SPIRIVA) 18 MCG inhalation capsule Place 18 mcg into inhaler and inhale daily.   Yes [provider]  tiZANidine (ZANAFLEX) 4 MG tablet Take 4 mg by mouth every 8 (eight) hours as needed (for back pain).  08/21/17  Yes [provider]  Vitamin D, Ergocalciferol, (DRISDOL) 1.25 MG (50000 UNIT) CAPS capsule Take 1 capsule (50,000 Units total) by mouth every Tuesday. 08/09/19 09/08/19 Yes Pahwani, Einar Grad, MD  diphenhydrAMINE (BENADRYL) 25 mg capsule Take 1 capsule (25 mg total) by mouth every 6 (six) hours as needed (for  sinus symptoms/congestion). Patient not taking: Reported on 08/15/2019 08/05/19   Darliss Cheney, MD  OXYGEN Inhale 2 L/min into the lungs continuous.    [provider]    Allergies    Diphenhydramine, Doxycycline, Hydrocodone, Oxycodone, Penicillins, Potassium-containing compounds, Tramadol, Hydrocodone-acetaminophen, Other, Percocet [oxycodone-acetaminophen], Vicodin [hydrocodone-acetaminophen], Potassium, and Statins  Review of Systems   Review of Systems  Constitutional: Negative for fever.  Respiratory: Negative for shortness of breath.   Cardiovascular: Negative for chest pain.  Gastrointestinal: Negative for abdominal pain and vomiting.  Genitourinary: Positive for decreased urine volume, difficulty urinating and dysuria.  Musculoskeletal: Positive for back pain.       Chronic back pain  Neurological: Negative for weakness.  All other systems reviewed and are negative.   Physical Exam Updated Vital  Signs BP (!) 149/60   Pulse 92   Temp 98.8 F (37.1 C) (Oral)   Resp (!) 23   Ht 1.626 m (5\' 4" )   Wt 65.4 kg   SpO2 100%   BMI 24.75 kg/m   Physical Exam CONSTITUTIONAL: Elderly, chronically ill-appearing HEAD: Normocephalic/atraumatic EYES: EOMI/PERRL ENMT: Mucous membranes moist NECK: supple no meningeal signs SPINE/BACK:entire spine nontender CV: S1/S2 noted, no murmurs/rubs/gallops noted LUNGS: Lungs are clear to auscultation bilaterally, no apparent distress ABDOMEN: soft, nontender, no rebound or guarding, bowel sounds noted throughout abdomen GU:no cva tenderness NEURO: Pt is awake/alert/appropriate, moves all extremitiesx4.  No facial droop.  No focal weakness noted in either lower extremity EXTREMITIES: pulses normal/equal, full ROM SKIN: warm, color normal PSYCH: no abnormalities of mood noted, alert and oriented to situation  ED Results / Procedures / Treatments   Labs (all labs ordered are listed, but only abnormal results are displayed) Labs Reviewed  COMPREHENSIVE METABOLIC PANEL - Abnormal; Notable for the following components:      Result Value   Chloride 95 (*)    Glucose, Bld 131 (*)    Calcium 8.5 (*)    Total Protein 6.0 (*)    Albumin 2.9 (*)    AST 14 (*)    GFR calc non Af Amer 52 (*)    All other components within normal limits  CBC - Abnormal; Notable for the following components:   WBC 12.4 (*)    RBC 3.61 (*)    Hemoglobin 10.6 (*)    HCT 34.0 (*)    All other components within normal limits  URINALYSIS, ROUTINE W REFLEX MICROSCOPIC - Abnormal; Notable for the following components:   Color, Urine AMBER (*)    APPearance HAZY (*)    Hgb urine dipstick LARGE (*)    Protein, ur 100 (*)    Leukocytes,Ua TRACE (*)    Bacteria, UA RARE (*)    All other components within normal limits  URINE CULTURE    EKG None  Radiology No results found.  Procedures Procedures  Medications Ordered in ED Medications  sodium chloride flush  (NS) 0.9 % injection 3 mL (has no administration in time range)  acetaminophen (TYLENOL) tablet 500 mg (500 mg Oral Given 08/15/19 0036)  albuterol (VENTOLIN HFA) 108 (90 Base) MCG/ACT inhaler 2 puff (2 puffs Inhalation Given 08/15/19 0037)  ALPRAZolam (XANAX) tablet 1 mg (1 mg Oral Given 08/15/19 0035)  ipratropium (ATROVENT) nebulizer solution 0.5 mg (0.5 mg Nebulization Refused 08/15/19 0604)  fosfomycin (MONUROL) packet 3 g (has no administration in time range)  albuterol (VENTOLIN HFA) 108 (90 Base) MCG/ACT inhaler 2 puff (2 puffs Inhalation Given 08/15/19 0540)  albuterol (PROVENTIL) (2.5 MG/3ML) 0.083%  nebulizer solution 5 mg (5 mg Nebulization Given 08/15/19 0600)    ED Course  I have reviewed the triage vital signs and the nursing notes.  Pertinent labs results that were available during my care of the patient were reviewed by me and considered in my medical decision making (see chart for details).    MDM Rules/Calculators/A&P                      1:33 AM Bedside ultrasound reveals urinary retention.  Patient reports she is unable to urinate.  She does not want to have a Foley catheter, but will accept in and out catheterization 4:21 AM Patient found to have hematuria.  Patient had 250 mL of urine in her bladder. Patient is hesitant to have a catheter placed.  She would like to spontaneously void first. 5:55 AM Patient was able to spontaneously void approximately 150 mL.  She rested comfortably throughout the night.  She is now awake and alert and is mildly agitated reporting she is short of breath.  Patient does have mild tachypnea and wheezing.  She was given albuterol MDI earlier, now we will give nebulized treatment. 6:34 AM Patient feels improved.  Wheezing and work of breathing is improved.  I had a long discussion with patient about hematuria and need for follow-up with urology.  Patient was able to urinate spontaneously.  However she did have clots noted when she had her urine cath,  and advised this may occur again.  Patient is also requesting antibiotics for potential UTI.  Urine culture sent.  Due to allergies, as well as multiple drug interactions, will give 1 dose of fosfomycin. Patient otherwise appropriate for discharge home Final Clinical Impression(s) / ED Diagnoses Final diagnoses:  Dysuria  Hematuria, unspecified type    Rx / DC Orders ED Discharge Orders    None       Ripley Fraise, MD 08/15/19 [phone]

## 2019-08-15 NOTE — ED Notes (Addendum)
I/o urinary cath performed. approx 250ML taken out. UA and culture sent. Blood clots present in urine. Provider aware

## 2019-08-15 NOTE — ED Notes (Signed)
ptar called 

## 2019-08-15 NOTE — ED Notes (Signed)
Pt c/o increased work of breathing. MD made aware. Albuterol inhalor ordered. Pt still has increased work of breathing. MD at bedside.

## 2019-08-15 NOTE — ED Notes (Signed)
Pt refused atrovent and stated that when she is in the hospital they give her polmicort. MD made aware

## 2019-08-15 NOTE — ED Notes (Signed)
Report given to PTAR. Pt verbalized understanding of d/c instructions. Pt had no additional questions and left in care of PTAR

## 2019-08-15 NOTE — ED Notes (Signed)
Pt given another cup of water and reeducated on need for her to urinate.

## 2019-08-15 NOTE — ED Notes (Signed)
Pt assisted to the bedside commode and was able to urinate approx 188ml.

## 2019-08-16 ENCOUNTER — Emergency Department (HOSPITAL_COMMUNITY)
Admission: EM | Admit: 2019-08-16 | Discharge: 2019-08-16 | Disposition: A | Discharge status: Home / Self Care | Payer: Medicare Other | Attending: Emergency Medicine | Admitting: Emergency Medicine

## 2019-08-16 ENCOUNTER — Emergency Department (HOSPITAL_COMMUNITY): Payer: Medicare Other

## 2019-08-16 ENCOUNTER — Encounter (HOSPITAL_COMMUNITY): Payer: Self-pay | Admitting: Emergency Medicine

## 2019-08-16 DIAGNOSIS — B9620 Unspecified Escherichia coli [E. coli] as the cause of diseases classified elsewhere: Secondary | ICD-10-CM

## 2019-08-16 DIAGNOSIS — R339 Retention of urine, unspecified: Secondary | ICD-10-CM | POA: Diagnosis not present

## 2019-08-16 DIAGNOSIS — J449 Chronic obstructive pulmonary disease, unspecified: Secondary | ICD-10-CM | POA: Diagnosis not present

## 2019-08-16 DIAGNOSIS — N3001 Acute cystitis with hematuria: Secondary | ICD-10-CM | POA: Diagnosis not present

## 2019-08-16 DIAGNOSIS — Z7982 Long term (current) use of aspirin: Secondary | ICD-10-CM | POA: Insufficient documentation

## 2019-08-16 DIAGNOSIS — I509 Heart failure, unspecified: Secondary | ICD-10-CM | POA: Insufficient documentation

## 2019-08-16 DIAGNOSIS — A042 Enteroinvasive Escherichia coli infection: Secondary | ICD-10-CM | POA: Insufficient documentation

## 2019-08-16 DIAGNOSIS — Z95810 Presence of automatic (implantable) cardiac defibrillator: Secondary | ICD-10-CM | POA: Insufficient documentation

## 2019-08-16 DIAGNOSIS — J9811 Atelectasis: Secondary | ICD-10-CM | POA: Diagnosis not present

## 2019-08-16 DIAGNOSIS — R069 Unspecified abnormalities of breathing: Secondary | ICD-10-CM | POA: Diagnosis not present

## 2019-08-16 DIAGNOSIS — I447 Left bundle-branch block, unspecified: Secondary | ICD-10-CM | POA: Diagnosis not present

## 2019-08-16 DIAGNOSIS — I10 Essential (primary) hypertension: Secondary | ICD-10-CM | POA: Diagnosis not present

## 2019-08-16 DIAGNOSIS — J90 Pleural effusion, not elsewhere classified: Secondary | ICD-10-CM | POA: Diagnosis not present

## 2019-08-16 DIAGNOSIS — R0602 Shortness of breath: Secondary | ICD-10-CM | POA: Diagnosis not present

## 2019-08-16 DIAGNOSIS — Z87891 Personal history of nicotine dependence: Secondary | ICD-10-CM | POA: Insufficient documentation

## 2019-08-16 DIAGNOSIS — Z79899 Other long term (current) drug therapy: Secondary | ICD-10-CM | POA: Insufficient documentation

## 2019-08-16 DIAGNOSIS — Z951 Presence of aortocoronary bypass graft: Secondary | ICD-10-CM | POA: Diagnosis not present

## 2019-08-16 DIAGNOSIS — I110 Hypertensive heart disease with heart failure: Secondary | ICD-10-CM | POA: Diagnosis not present

## 2019-08-16 DIAGNOSIS — R3912 Poor urinary stream: Secondary | ICD-10-CM | POA: Diagnosis present

## 2019-08-16 DIAGNOSIS — I959 Hypotension, unspecified: Secondary | ICD-10-CM | POA: Diagnosis not present

## 2019-08-16 LAB — URINE CULTURE: Culture: >=100000 — AB

## 2019-08-16 LAB — COMPREHENSIVE METABOLIC PANEL
ALT: 19 U/L (ref 0–44)
AST: 13 U/L — ABNORMAL LOW (ref 15–41)
Albumin: 2.7 g/dL — ABNORMAL LOW (ref 3.5–5.0)
Alkaline Phosphatase: 90 U/L (ref 38–126)
Anion gap: 16 — ABNORMAL HIGH (ref 5–15)
BUN: 29 mg/dL — ABNORMAL HIGH (ref 8–23)
CO2: 32 mmol/L (ref 22–32)
Calcium: 8.8 mg/dL — ABNORMAL LOW (ref 8.9–10.3)
Chloride: 96 mmol/L — ABNORMAL LOW (ref 98–111)
Creatinine, Ser: 1.09 mg/dL — ABNORMAL HIGH (ref 0.44–1.00)
GFR calc Af Amer: 55 mL/min — ABNORMAL LOW (ref >60)
GFR calc non Af Amer: 47 mL/min — ABNORMAL LOW (ref >60)
Glucose, Bld: 124 mg/dL — ABNORMAL HIGH (ref 70–99)
Potassium: 3.5 mmol/L (ref 3.5–5.1)
Sodium: 144 mmol/L (ref 135–145)
Total Bilirubin: 0.8 mg/dL (ref 0.3–1.2)
Total Protein: 6 g/dL — ABNORMAL LOW (ref 6.5–8.1)

## 2019-08-16 LAB — CBC WITH DIFFERENTIAL/PLATELET
Abs Immature Granulocytes: 0.02 10*3/uL (ref 0.00–0.07)
Basophils Absolute: 0 10*3/uL (ref 0.0–0.1)
Basophils Relative: 0 %
Eosinophils Absolute: 0.1 10*3/uL (ref 0.0–0.5)
Eosinophils Relative: 2 %
HCT: 32.1 % — ABNORMAL LOW (ref 36.0–46.0)
Hemoglobin: 9.9 g/dL — ABNORMAL LOW (ref 12.0–15.0)
Immature Granulocytes: 0 %
Lymphocytes Relative: 11 %
Lymphs Abs: 0.8 10*3/uL (ref 0.7–4.0)
MCH: 29.2 pg (ref 26.0–34.0)
MCHC: 30.8 g/dL (ref 30.0–36.0)
MCV: 94.7 fL (ref 80.0–100.0)
Monocytes Absolute: 0.8 10*3/uL (ref 0.1–1.0)
Monocytes Relative: 12 %
Neutro Abs: 5.2 10*3/uL (ref 1.7–7.7)
Neutrophils Relative %: 75 %
Platelets: 183 10*3/uL (ref 150–400)
RBC: 3.39 MIL/uL — ABNORMAL LOW (ref 3.87–5.11)
RDW: 14 % (ref 11.5–15.5)
WBC: 6.9 10*3/uL (ref 4.0–10.5)
nRBC: 0 % (ref 0.0–0.2)

## 2019-08-16 LAB — BRAIN NATRIURETIC PEPTIDE: B Natriuretic Peptide: 503.7 pg/mL — ABNORMAL HIGH (ref 0.0–100.0)

## 2019-08-16 MED ORDER — CEPHALEXIN 500 MG PO CAPS
500.0000 mg | ORAL_CAPSULE | Freq: Two times a day (BID) | ORAL | 0 refills | Status: AC
Start: 2019-08-16 — End: 2019-08-26

## 2019-08-16 MED ORDER — CEPHALEXIN 250 MG PO CAPS
500.0000 mg | ORAL_CAPSULE | Freq: Once | ORAL | Status: AC
  Administered 2019-08-16: 500 mg via ORAL
  Filled 2019-08-16: qty 2

## 2019-08-16 MED ORDER — FUROSEMIDE 10 MG/ML IJ SOLN
40.0000 mg | Freq: Once | INTRAMUSCULAR | Status: AC
  Administered 2019-08-16: 40 mg via INTRAVENOUS
  Filled 2019-08-16: qty 4

## 2019-08-16 NOTE — ED Provider Notes (Signed)
[facility] EMERGENCY DEPARTMENT Provider Note   CSN: [id number] Arrival date & time: 08/16/19  1014     History Chief Complaint  Patient presents with  . Shortness of Breath    Brandi Gardner is a 85 y.o. female.  HPI     Woke up this morning, only able to urinate a trickle Took lasix and has not been able to urinate since then Hx of difficulty breathing COPD, bronchitis for a long time, comes and goes, breathing today not quite as bad as when she usually calls 911, tried taking the 80mg  of lasix Feels now like she needs to urinate Feels a pressure like needs to urinate and when she goes it is just a trickle Usually after taking the lasix will go more but today has not urinated since 9AM Mild dyspnea today but feeling better now, not feeling short of breath now, normally on 3L No chest pain but does have some heaviness/thick phlegm/mucus-chronic, no new symptoms Cough is about the same No fever Reports when she has fluid on it always is alittle worse on left side, chronic for a long time, Over the past week does feel swelling is getting worse  Urine has odor for months, last 2-3 weeks has been having urinary frequency but when go it is just a trickle No n/v/abdominal pain or flank pain  Past Medical History:  Diagnosis Date  . Acute on chronic systolic (congestive) heart failure ([place]) 04/24/2015  . AICD (automatic cardioverter/defibrillator) present   . Anxiety   . Cardiac defibrillator in place 04/25/2015  . CHF (congestive heart failure) ([place])   . Chronic obstructive pulmonary disease with acute exacerbation ([place]) 04/22/2015  . COPD (chronic obstructive pulmonary disease) ([place])   . Coronary artery disease   . Hx of pulmonary embolus 1980  . Hyperlipidemia   . Hypertension   . Ischemic cardiomyopathy 05/24/2015  . S/P CABG (coronary artery bypass graft) 05/24/2015    Patient Active Problem List   Diagnosis Date Noted  . Shortness of breath   . Chest  pain 07/30/2019  . Palliative care by specialist   . Goals of care, counseling/discussion   . Angina pectoris ([place]) 07/29/2019  . Chest pain with high risk of acute coronary syndrome 07/29/2019  . Acute exacerbation of CHF (congestive heart failure) ([place]) 06/27/2019  . Chronic respiratory failure with hypoxia ([place]) 06/27/2019  . NSVT (nonsustained ventricular tachycardia) ([place]) 06/27/2019  . COPD (chronic obstructive pulmonary disease) ([place]) 06/27/2019  . AKI (acute kidney injury) ([place]) 06/27/2019  . Acute respiratory failure with hypoxia and hypercapnia (HCC)   . Congestive heart failure (CHF) ([place]) 08/26/2017  . CHF (congestive heart failure) ([place]) 08/26/2017  . Hyperkalemia 10/22/2016  . Mitral regurgitation 10/13/2016  . Hyperlipidemia   . Ischemic cardiomyopathy 05/24/2015  . S/P CABG (coronary artery bypass graft) 05/24/2015  . Cardiac defibrillator in place 04/25/2015  . Acute on chronic systolic (congestive) heart failure ([place]) 04/24/2015  . Chronic obstructive pulmonary disease with acute exacerbation ([place]) 04/22/2015  . Hx of pulmonary embolus 03/10/1978    Past Surgical History:  Procedure Laterality Date  . CARDIAC CATHETERIZATION    . CORONARY ARTERY BYPASS GRAFT    . IR GENERIC HISTORICAL  11/08/2015   IR RADIOLOGIST EVAL & MGMT 11/08/2015 [facility]  . IR GENERIC HISTORICAL  11/13/2015   IR VERTEBROPLASTY LUMBAR BX INC UNI/BIL INC/INJECT/IMAGING 11/13/2015 Luanne Bras, MD [facility]  . IR GENERIC HISTORICAL  12/07/2015   IR RADIOLOGIST EVAL & MGMT  12/07/2015 [facility]  . TUBAL LIGATION       OB History   No obstetric history on file.     Family History  Problem Relation Age of Onset  . Hypertension Mother   . Uterine cancer Mother   . Hypertension Father     Social History   Tobacco Use  . Smoking status: Former Smoker    Quit date: 11/12/1992    Years since quitting: 26.7  . Smokeless tobacco: Never Used  Substance Use Topics  . Alcohol  use: No  . Drug use: No    Home Medications Prior to Admission medications   Medication Sig Start Date End Date Taking? Authorizing Provider  acetaminophen (TYLENOL) 500 MG tablet Take 500 mg by mouth every 6 (six) hours as needed for mild pain or headache.    Yes [provider]  albuterol (PROAIR HFA) 108 (90 Base) MCG/ACT inhaler Inhale 2 puffs into the lungs every 6 (six) hours as needed for wheezing or shortness of breath.   Yes [provider]  albuterol (PROVENTIL) (2.5 MG/3ML) 0.083% nebulizer solution Take 2.5 mg by nebulization in the morning and at bedtime.   Yes [provider]  ALPRAZolam Duanne Moron) 1 MG tablet Take 1 tablet (1 mg total) by mouth 2 (two) times daily as needed for anxiety. Patient taking differently: Take 1 mg by mouth 2 (two) times daily.  08/30/17  Yes Guadalupe Dawn, MD  aspirin EC 81 MG tablet Take 81 mg by mouth daily.   Yes [provider]  benzonatate (TESSALON) 200 MG capsule Take 1 capsule (200 mg total) by mouth 3 (three) times daily as needed for cough. 08/05/19  Yes Pahwani, Einar Grad, MD  budesonide (PULMICORT) 0.5 MG/2ML nebulizer solution Take 0.5 mg by nebulization in the morning and at bedtime.   Yes [provider]  carvedilol (COREG) 3.125 MG tablet Take 3.125 mg by mouth 2 (two) times daily with a meal.   Yes [provider]  ferrous sulfate 324 (65 Fe) MG TBEC Take 324 mg by mouth daily with breakfast.  06/09/17  Yes [provider]  fluticasone (FLONASE) 50 MCG/ACT nasal spray Place 2 sprays into both nostrils daily as needed for allergies.    Yes [provider]  fluticasone furoate-vilanterol (BREO ELLIPTA) 100-25 MCG/INH AEPB Inhale 1 puff into the lungs daily.   Yes [provider]  furosemide (LASIX) 40 MG tablet Take 2 tablets (80 mg total) by mouth in the morning. 07/01/19  Yes Eulogio Bear U, DO  isosorbide mononitrate (IMDUR) 30 MG 24 hr tablet Take 0.5 tablets (15 mg  total) by mouth daily. 08/06/19 09/05/19 Yes Pahwani, Einar Grad, MD  nitroGLYCERIN (NITROSTAT) 0.4 MG SL tablet Place 0.4 mg under the tongue every 5 (five) minutes as needed for chest pain.   Yes [provider]  nystatin-triamcinolone (MYCOLOG II) cream Apply 1 application topically 2 (two) times daily as needed (for yeast).   Yes [provider]  pantoprazole (PROTONIX) 40 MG tablet Take 40 mg by mouth daily before breakfast.  06/09/17  Yes [provider]  Probiotic CAPS Take 1 capsule by mouth daily.   Yes [provider]  sertraline (ZOLOFT) 50 MG tablet Take 50 mg by mouth daily. 07/21/19  Yes [provider]  tiotropium (SPIRIVA) 18 MCG inhalation capsule Place 18 mcg into inhaler and inhale daily.   Yes [provider]  vitamin B-12 (CYANOCOBALAMIN) 100 MCG tablet Take 100 mcg by mouth daily.  Yes [provider]  Vitamin D, Ergocalciferol, (DRISDOL) 1.25 MG (50000 UNIT) CAPS capsule Take 1 capsule (50,000 Units total) by mouth every Tuesday. 08/09/19 09/08/19 Yes Pahwani, Einar Grad, MD  cephALEXin (KEFLEX) 500 MG capsule Take 1 capsule (500 mg total) by mouth 2 (two) times daily for 10 days. 08/16/19 08/26/19  Gareth Morgan, MD  diphenhydrAMINE (BENADRYL) 25 mg capsule Take 1 capsule (25 mg total) by mouth every 6 (six) hours as needed (for sinus symptoms/congestion). Patient not taking: Reported on 08/15/2019 08/05/19   Darliss Cheney, MD  OXYGEN Inhale 2 L/min into the lungs continuous.    [provider]    Allergies    Diphenhydramine, Doxycycline, Hydrocodone, Oxycodone, Penicillins, Potassium-containing compounds, Tramadol, Hydrocodone-acetaminophen, Other, Percocet [oxycodone-acetaminophen], Vicodin [hydrocodone-acetaminophen], Potassium, and Statins  Review of Systems   Review of Systems  Constitutional: Positive for fatigue. Negative for fever.  HENT: Negative for sore throat.   Eyes: Negative for visual disturbance.    Respiratory: Positive for shortness of breath (slightly worse this AM but improved now). Negative for cough (chronic unchanged).   Cardiovascular: Positive for leg swelling (slightly worse). Negative for chest pain.  Gastrointestinal: Negative for abdominal pain, diarrhea, nausea and vomiting.  Genitourinary: Positive for difficulty urinating, frequency and urgency. Negative for flank pain. Dysuria: funny feeling but not burning.  Musculoskeletal: Negative for back pain and neck pain.  Skin: Negative for rash.  Neurological: Negative for syncope and headaches.    Physical Exam Updated Vital Signs BP 138/66   Pulse 87   Temp 98 F (36.7 C) Comment: oral  Resp (!) 21   Ht 5\' 4"  (1.626 m)   Wt 65.3 kg   SpO2 100%   BMI 24.72 kg/m   Physical Exam Vitals and nursing note reviewed.  Constitutional:      General: She is not in acute distress.    Appearance: She is well-developed. She is not diaphoretic.  HENT:     Head: Normocephalic and atraumatic.  Eyes:     Conjunctiva/sclera: Conjunctivae normal.  Cardiovascular:     Rate and Rhythm: Normal rate and regular rhythm.     Heart sounds: Normal heart sounds. No murmur. No friction rub. No gallop.   Pulmonary:     Effort: Pulmonary effort is normal. No respiratory distress.     Breath sounds: Rales present. No wheezing.  Abdominal:     General: There is no distension.     Palpations: Abdomen is soft.     Tenderness: There is no abdominal tenderness. There is no guarding.  Musculoskeletal:        General: No tenderness.     Cervical back: Normal range of motion.     Right lower leg: Edema present.     Left lower leg: Edema (mildly wors eon L side, baseline per pt) present.  Skin:    General: Skin is warm and dry.     Findings: No erythema or rash.  Neurological:     Mental Status: She is alert and oriented to person, place, and time.     ED Results / Procedures / Treatments   Labs (all labs ordered are listed, but only  abnormal results are displayed) Labs Reviewed  CBC WITH DIFFERENTIAL/PLATELET - Abnormal; Notable for the following components:      Result Value   RBC 3.39 (*)    Hemoglobin 9.9 (*)    HCT 32.1 (*)    All other components within normal limits  COMPREHENSIVE METABOLIC PANEL - Abnormal; Notable for  the following components:   Chloride 96 (*)    Glucose, Bld 124 (*)    BUN 29 (*)    Creatinine, Ser 1.09 (*)    Calcium 8.8 (*)    Total Protein 6.0 (*)    Albumin 2.7 (*)    AST 13 (*)    GFR calc non Af Amer 47 (*)    GFR calc Af Amer 55 (*)    Anion gap 16 (*)    All other components within normal limits  BRAIN NATRIURETIC PEPTIDE - Abnormal; Notable for the following components:   B Natriuretic Peptide 503.7 (*)    All other components within normal limits    EKG EKG Interpretation  Date/Time:  Tuesday August 16 2019 10:19:35 EDT Ventricular Rate:  82 PR Interval:  192 QRS Duration: 118 QT Interval:  406 QTC Calculation: 474 R Axis:   77 Text Interpretation: Normal sinus rhythm Anteroseptal infarct , age undetermined Abnormal ECG No significant change since last tracing Confirmed by Gareth Morgan [phone]) on 08/16/2019 3:23:33 PM   Radiology DG Chest 2 View  Result Date: 08/16/2019 CLINICAL DATA:  Shortness of breath. EXAM: CHEST - 2 VIEW COMPARISON:  Single-view of the chest 08/05/2019 and 07/31/2019. FINDINGS: Small pleural effusions are seen, greater on the right. There is associated basilar atelectasis. Heart size is upper normal. No pulmonary edema or pneumothorax. The patient is status post CABG with an AICD in place. IMPRESSION: Small bilateral pleural effusions and basilar atelectasis are greater on the right and not notably changed. Electronically Signed   By: Inge Rise M.D.   On: 08/16/2019 16:51    Procedures Procedures (including critical care time)  Medications Ordered in ED Medications  cephALEXin (KEFLEX) capsule 500 mg (500 mg Oral Given 08/16/19  1833)  furosemide (LASIX) injection 40 mg (40 mg Intravenous Given 08/16/19 1833)    ED Course  I have reviewed the triage vital signs and the nursing notes.  Pertinent labs & imaging results that were available during my care of the patient were reviewed by me and considered in my medical decision making (see chart for details).    MDM Rules/Calculators/A&P                      85yo female with the history above presents with concern for CHF, COPD, CAD, hx of PE, presents with urinary frequency/urgency/hesitancy and mildly increased dyspnea from baseline.    Evaluated in the ED last night for similar. Found to have urinary retention last night as well as today found to have post void residual in the 300s.  Given urinary retention, placed foley catheter and recommend follow up with Urology.  Doubt cauda equina as etiology.  Hgb essentially stable from prior, denies significant bleeding.  Urine cx shows E Coli sensitive to cephalosporins. Given kefex in ED and rx for same.    Reports mildly increased dyspnea earlier today, CXR with pleural effusions, not significantly changed, BNP 503 from 480.  Given lasix IV with concern for mild fluid overload.  Patient discharged in stable condition with understanding of reasons to return.      .   Final Clinical Impression(s) / ED Diagnoses Final diagnoses:  Urinary retention  Acute cystitis with hematuria  E-coli UTI    Rx / DC Orders ED Discharge Orders         Ordered    cephALEXin (KEFLEX) 500 MG capsule  2 times daily     08/16/19 1854  Gareth Morgan, MD 08/17/19 [phone]

## 2019-08-16 NOTE — ED Triage Notes (Signed)
Pt arrives from home with [facility] - c/c of sob wears 3L [place] 98% on her 3L per ems was ambulatory to truck. Pt arrives to triage alert and 0x4, no distress.

## 2019-08-17 ENCOUNTER — Telehealth: Payer: Self-pay | Admitting: *Deleted

## 2019-08-17 NOTE — Telephone Encounter (Signed)
Post ED Visit - Positive Culture Follow-up  Culture report reviewed by antimicrobial stewardship pharmacist: [facility] Team []  Elenor Quinones, Pharm.D. []  Heide Guile, Pharm.D., BCPS AQ-ID []  Parks Neptune, Pharm.D., BCPS []  Alycia Rossetti, Pharm.D., BCPS []  [place], Pharm.D., BCPS, AAHIVP []  Legrand Como, Pharm.D., BCPS, AAHIVP []  Salome Arnt, PharmD, BCPS []  Johnnette Gourd, PharmD, BCPS []  Hughes Better, PharmD, BCPS []  Leeroy Cha, PharmD []  Laqueta Linden, PharmD, BCPS []  Albertina Parr, PharmD  [place] Team []  Leodis Sias, PharmD []  Lindell Spar, PharmD []  Royetta Asal, PharmD []  Graylin Shiver, Rph []  Rema Fendt) Glennon Mac, PharmD []  Arlyn Dunning, PharmD []  Netta Cedars, PharmD []  Dia Sitter, PharmD []  Leone Haven, PharmD []  Gretta Arab, PharmD []  Theodis Shove, PharmD []  Peggyann Juba, PharmD []  Reuel Boom, PharmD   Positive urine culture, reviewed by Lorel Monaco, Pharm Resident Treated with Cephalexin, organism sensitive to the same and no further patient follow-up is required at this time.  Harlon Flor [facility] 08/17/2019, 9:55 AM

## 2019-08-18 DIAGNOSIS — Z4502 Encounter for adjustment and management of automatic implantable cardiac defibrillator: Secondary | ICD-10-CM | POA: Diagnosis not present

## 2019-08-18 DIAGNOSIS — I2510 Atherosclerotic heart disease of native coronary artery without angina pectoris: Secondary | ICD-10-CM | POA: Diagnosis not present

## 2019-08-19 DIAGNOSIS — I509 Heart failure, unspecified: Secondary | ICD-10-CM | POA: Diagnosis not present

## 2019-08-19 DIAGNOSIS — J189 Pneumonia, unspecified organism: Secondary | ICD-10-CM | POA: Diagnosis not present

## 2019-08-19 DIAGNOSIS — J9811 Atelectasis: Secondary | ICD-10-CM | POA: Diagnosis not present

## 2019-08-19 DIAGNOSIS — K219 Gastro-esophageal reflux disease without esophagitis: Secondary | ICD-10-CM | POA: Diagnosis not present

## 2019-08-19 DIAGNOSIS — I340 Nonrheumatic mitral (valve) insufficiency: Secondary | ICD-10-CM

## 2019-08-19 DIAGNOSIS — J9602 Acute respiratory failure with hypercapnia: Secondary | ICD-10-CM | POA: Diagnosis not present

## 2019-08-19 DIAGNOSIS — I517 Cardiomegaly: Secondary | ICD-10-CM | POA: Diagnosis not present

## 2019-08-19 DIAGNOSIS — J9601 Acute respiratory failure with hypoxia: Secondary | ICD-10-CM

## 2019-08-19 DIAGNOSIS — I361 Nonrheumatic tricuspid (valve) insufficiency: Secondary | ICD-10-CM | POA: Diagnosis not present

## 2019-08-19 DIAGNOSIS — J441 Chronic obstructive pulmonary disease with (acute) exacerbation: Secondary | ICD-10-CM

## 2019-08-20 DIAGNOSIS — J9602 Acute respiratory failure with hypercapnia: Secondary | ICD-10-CM | POA: Diagnosis not present

## 2019-08-20 DIAGNOSIS — J9601 Acute respiratory failure with hypoxia: Secondary | ICD-10-CM | POA: Diagnosis not present

## 2019-08-20 DIAGNOSIS — J441 Chronic obstructive pulmonary disease with (acute) exacerbation: Secondary | ICD-10-CM | POA: Diagnosis not present

## 2019-08-21 DIAGNOSIS — J441 Chronic obstructive pulmonary disease with (acute) exacerbation: Secondary | ICD-10-CM | POA: Diagnosis not present

## 2019-08-21 DIAGNOSIS — J9601 Acute respiratory failure with hypoxia: Secondary | ICD-10-CM | POA: Diagnosis not present

## 2019-08-21 DIAGNOSIS — J9602 Acute respiratory failure with hypercapnia: Secondary | ICD-10-CM | POA: Diagnosis not present

## 2019-08-22 DIAGNOSIS — J441 Chronic obstructive pulmonary disease with (acute) exacerbation: Secondary | ICD-10-CM | POA: Diagnosis not present

## 2019-08-22 DIAGNOSIS — J9601 Acute respiratory failure with hypoxia: Secondary | ICD-10-CM | POA: Diagnosis not present

## 2019-08-22 DIAGNOSIS — J9811 Atelectasis: Secondary | ICD-10-CM | POA: Diagnosis not present

## 2019-08-22 DIAGNOSIS — J90 Pleural effusion, not elsewhere classified: Secondary | ICD-10-CM | POA: Diagnosis not present

## 2019-08-22 DIAGNOSIS — R0602 Shortness of breath: Secondary | ICD-10-CM | POA: Diagnosis not present

## 2019-08-22 DIAGNOSIS — J9602 Acute respiratory failure with hypercapnia: Secondary | ICD-10-CM | POA: Diagnosis not present

## 2019-08-23 DIAGNOSIS — J441 Chronic obstructive pulmonary disease with (acute) exacerbation: Secondary | ICD-10-CM | POA: Diagnosis not present

## 2019-08-23 DIAGNOSIS — J939 Pneumothorax, unspecified: Secondary | ICD-10-CM | POA: Diagnosis not present

## 2019-08-23 DIAGNOSIS — J90 Pleural effusion, not elsewhere classified: Secondary | ICD-10-CM | POA: Diagnosis not present

## 2019-08-23 DIAGNOSIS — J9601 Acute respiratory failure with hypoxia: Secondary | ICD-10-CM | POA: Diagnosis not present

## 2019-08-23 DIAGNOSIS — J9602 Acute respiratory failure with hypercapnia: Secondary | ICD-10-CM | POA: Diagnosis not present

## 2019-08-23 DIAGNOSIS — I119 Hypertensive heart disease without heart failure: Secondary | ICD-10-CM | POA: Diagnosis not present

## 2019-08-23 DIAGNOSIS — J449 Chronic obstructive pulmonary disease, unspecified: Secondary | ICD-10-CM | POA: Diagnosis not present

## 2019-08-24 DIAGNOSIS — J9602 Acute respiratory failure with hypercapnia: Secondary | ICD-10-CM | POA: Diagnosis not present

## 2019-08-24 DIAGNOSIS — I5022 Chronic systolic (congestive) heart failure: Secondary | ICD-10-CM

## 2019-08-24 DIAGNOSIS — J441 Chronic obstructive pulmonary disease with (acute) exacerbation: Secondary | ICD-10-CM | POA: Diagnosis not present

## 2019-08-24 DIAGNOSIS — I120 Hypertensive chronic kidney disease with stage 5 chronic kidney disease or end stage renal disease: Secondary | ICD-10-CM

## 2019-08-24 DIAGNOSIS — R918 Other nonspecific abnormal finding of lung field: Secondary | ICD-10-CM

## 2019-08-24 DIAGNOSIS — Z95810 Presence of automatic (implantable) cardiac defibrillator: Secondary | ICD-10-CM

## 2019-08-24 DIAGNOSIS — I255 Ischemic cardiomyopathy: Secondary | ICD-10-CM | POA: Diagnosis not present

## 2019-08-24 DIAGNOSIS — J9601 Acute respiratory failure with hypoxia: Secondary | ICD-10-CM | POA: Diagnosis not present

## 2019-08-24 DIAGNOSIS — I2510 Atherosclerotic heart disease of native coronary artery without angina pectoris: Secondary | ICD-10-CM | POA: Diagnosis not present

## 2019-08-24 DIAGNOSIS — I119 Hypertensive heart disease without heart failure: Secondary | ICD-10-CM | POA: Diagnosis not present

## 2019-08-25 ENCOUNTER — Inpatient Hospital Stay (HOSPITAL_COMMUNITY)
Admission: AD | Admit: 2019-08-25 | Discharge: 2019-09-08 | DRG: 208 | Disposition: E | Payer: Medicare Other | Source: Other Acute Inpatient Hospital | Attending: Internal Medicine | Admitting: Internal Medicine

## 2019-08-25 ENCOUNTER — Inpatient Hospital Stay (HOSPITAL_COMMUNITY): Payer: Medicare Other

## 2019-08-25 DIAGNOSIS — Z87891 Personal history of nicotine dependence: Secondary | ICD-10-CM | POA: Diagnosis not present

## 2019-08-25 DIAGNOSIS — C3411 Malignant neoplasm of upper lobe, right bronchus or lung: Secondary | ICD-10-CM | POA: Diagnosis present

## 2019-08-25 DIAGNOSIS — J9602 Acute respiratory failure with hypercapnia: Secondary | ICD-10-CM | POA: Diagnosis not present

## 2019-08-25 DIAGNOSIS — J9622 Acute and chronic respiratory failure with hypercapnia: Secondary | ICD-10-CM | POA: Diagnosis present

## 2019-08-25 DIAGNOSIS — I340 Nonrheumatic mitral (valve) insufficiency: Secondary | ICD-10-CM | POA: Diagnosis present

## 2019-08-25 DIAGNOSIS — Z8049 Family history of malignant neoplasm of other genital organs: Secondary | ICD-10-CM

## 2019-08-25 DIAGNOSIS — J80 Acute respiratory distress syndrome: Secondary | ICD-10-CM

## 2019-08-25 DIAGNOSIS — I5084 End stage heart failure: Secondary | ICD-10-CM | POA: Diagnosis present

## 2019-08-25 DIAGNOSIS — N186 End stage renal disease: Secondary | ICD-10-CM | POA: Diagnosis present

## 2019-08-25 DIAGNOSIS — I2510 Atherosclerotic heart disease of native coronary artery without angina pectoris: Secondary | ICD-10-CM | POA: Diagnosis not present

## 2019-08-25 DIAGNOSIS — Z885 Allergy status to narcotic agent status: Secondary | ICD-10-CM

## 2019-08-25 DIAGNOSIS — R4020 Unspecified coma: Secondary | ICD-10-CM | POA: Diagnosis present

## 2019-08-25 DIAGNOSIS — Z9981 Dependence on supplemental oxygen: Secondary | ICD-10-CM

## 2019-08-25 DIAGNOSIS — Z79899 Other long term (current) drug therapy: Secondary | ICD-10-CM

## 2019-08-25 DIAGNOSIS — Z7982 Long term (current) use of aspirin: Secondary | ICD-10-CM

## 2019-08-25 DIAGNOSIS — Z8249 Family history of ischemic heart disease and other diseases of the circulatory system: Secondary | ICD-10-CM | POA: Diagnosis not present

## 2019-08-25 DIAGNOSIS — I255 Ischemic cardiomyopathy: Secondary | ICD-10-CM | POA: Diagnosis present

## 2019-08-25 DIAGNOSIS — J9601 Acute respiratory failure with hypoxia: Secondary | ICD-10-CM | POA: Diagnosis not present

## 2019-08-25 DIAGNOSIS — J440 Chronic obstructive pulmonary disease with acute lower respiratory infection: Secondary | ICD-10-CM | POA: Diagnosis present

## 2019-08-25 DIAGNOSIS — R627 Adult failure to thrive: Secondary | ICD-10-CM | POA: Diagnosis present

## 2019-08-25 DIAGNOSIS — Z951 Presence of aortocoronary bypass graft: Secondary | ICD-10-CM

## 2019-08-25 DIAGNOSIS — Z880 Allergy status to penicillin: Secondary | ICD-10-CM

## 2019-08-25 DIAGNOSIS — Z881 Allergy status to other antibiotic agents status: Secondary | ICD-10-CM

## 2019-08-25 DIAGNOSIS — Z66 Do not resuscitate: Secondary | ICD-10-CM | POA: Diagnosis present

## 2019-08-25 DIAGNOSIS — I119 Hypertensive heart disease without heart failure: Secondary | ICD-10-CM | POA: Diagnosis not present

## 2019-08-25 DIAGNOSIS — I132 Hypertensive heart and chronic kidney disease with heart failure and with stage 5 chronic kidney disease, or end stage renal disease: Secondary | ICD-10-CM | POA: Diagnosis present

## 2019-08-25 DIAGNOSIS — Z888 Allergy status to other drugs, medicaments and biological substances status: Secondary | ICD-10-CM

## 2019-08-25 DIAGNOSIS — J9621 Acute and chronic respiratory failure with hypoxia: Secondary | ICD-10-CM | POA: Diagnosis present

## 2019-08-25 DIAGNOSIS — Z515 Encounter for palliative care: Secondary | ICD-10-CM | POA: Diagnosis not present

## 2019-08-25 DIAGNOSIS — R54 Age-related physical debility: Secondary | ICD-10-CM | POA: Diagnosis present

## 2019-08-25 DIAGNOSIS — Z978 Presence of other specified devices: Secondary | ICD-10-CM

## 2019-08-25 DIAGNOSIS — F419 Anxiety disorder, unspecified: Secondary | ICD-10-CM | POA: Diagnosis present

## 2019-08-25 DIAGNOSIS — L8990 Pressure ulcer of unspecified site, unspecified stage: Secondary | ICD-10-CM | POA: Insufficient documentation

## 2019-08-25 DIAGNOSIS — Z7951 Long term (current) use of inhaled steroids: Secondary | ICD-10-CM

## 2019-08-25 DIAGNOSIS — N179 Acute kidney failure, unspecified: Secondary | ICD-10-CM | POA: Diagnosis present

## 2019-08-25 DIAGNOSIS — I5022 Chronic systolic (congestive) heart failure: Secondary | ICD-10-CM | POA: Diagnosis not present

## 2019-08-25 DIAGNOSIS — Z4659 Encounter for fitting and adjustment of other gastrointestinal appliance and device: Secondary | ICD-10-CM

## 2019-08-25 DIAGNOSIS — J9690 Respiratory failure, unspecified, unspecified whether with hypoxia or hypercapnia: Secondary | ICD-10-CM | POA: Diagnosis present

## 2019-08-25 DIAGNOSIS — J441 Chronic obstructive pulmonary disease with (acute) exacerbation: Secondary | ICD-10-CM | POA: Diagnosis not present

## 2019-08-25 LAB — POCT I-STAT 7, (LYTES, BLD GAS, ICA,H+H)
Acid-base deficit: 3 mmol/L — ABNORMAL HIGH (ref 0.0–2.0)
Bicarbonate: 23.6 mmol/L (ref 20.0–28.0)
Calcium, Ion: 1.05 mmol/L — ABNORMAL LOW (ref 1.15–1.40)
HCT: 27 % — ABNORMAL LOW (ref 36.0–46.0)
Hemoglobin: 9.2 g/dL — ABNORMAL LOW (ref 12.0–15.0)
O2 Saturation: 92 %
Patient temperature: 98.3
Potassium: 4.8 mmol/L (ref 3.5–5.1)
Sodium: 146 mmol/L — ABNORMAL HIGH (ref 135–145)
TCO2: 25 mmol/L (ref 22–32)
pCO2 arterial: 49.5 mmHg — ABNORMAL HIGH (ref 32.0–48.0)
pH, Arterial: 7.286 — ABNORMAL LOW (ref 7.350–7.450)
pO2, Arterial: 71 mmHg — ABNORMAL LOW (ref 83.0–108.0)

## 2019-08-25 LAB — CBC WITH DIFFERENTIAL/PLATELET
Abs Immature Granulocytes: 0.02 10*3/uL (ref 0.00–0.07)
Basophils Absolute: 0 10*3/uL (ref 0.0–0.1)
Basophils Relative: 0 %
Eosinophils Absolute: 0 10*3/uL (ref 0.0–0.5)
Eosinophils Relative: 0 %
HCT: 28.9 % — ABNORMAL LOW (ref 36.0–46.0)
Hemoglobin: 8.8 g/dL — ABNORMAL LOW (ref 12.0–15.0)
Immature Granulocytes: 0 %
Lymphocytes Relative: 2 %
Lymphs Abs: 0.1 10*3/uL — ABNORMAL LOW (ref 0.7–4.0)
MCH: 28.9 pg (ref 26.0–34.0)
MCHC: 30.4 g/dL (ref 30.0–36.0)
MCV: 95.1 fL (ref 80.0–100.0)
Monocytes Absolute: 0.5 10*3/uL (ref 0.1–1.0)
Monocytes Relative: 8 %
Neutro Abs: 5.9 10*3/uL (ref 1.7–7.7)
Neutrophils Relative %: 90 %
Platelets: 276 10*3/uL (ref 150–400)
RBC: 3.04 MIL/uL — ABNORMAL LOW (ref 3.87–5.11)
RDW: 15.3 % (ref 11.5–15.5)
WBC: 6.6 10*3/uL (ref 4.0–10.5)
nRBC: 0 % (ref 0.0–0.2)

## 2019-08-25 LAB — MAGNESIUM
Magnesium: 3.1 mg/dL — ABNORMAL HIGH (ref 1.7–2.4)
Magnesium: 3.2 mg/dL — ABNORMAL HIGH (ref 1.7–2.4)

## 2019-08-25 LAB — COMPREHENSIVE METABOLIC PANEL
ALT: 31 U/L (ref 0–44)
AST: 12 U/L — ABNORMAL LOW (ref 15–41)
Albumin: 2.7 g/dL — ABNORMAL LOW (ref 3.5–5.0)
Alkaline Phosphatase: 74 U/L (ref 38–126)
Anion gap: 20 — ABNORMAL HIGH (ref 5–15)
BUN: 159 mg/dL — ABNORMAL HIGH (ref 8–23)
CO2: 22 mmol/L (ref 22–32)
Calcium: 8.1 mg/dL — ABNORMAL LOW (ref 8.9–10.3)
Chloride: 105 mmol/L (ref 98–111)
Creatinine, Ser: 4.46 mg/dL — ABNORMAL HIGH (ref 0.44–1.00)
GFR calc Af Amer: 10 mL/min — ABNORMAL LOW (ref >60)
GFR calc non Af Amer: 9 mL/min — ABNORMAL LOW (ref >60)
Glucose, Bld: 178 mg/dL — ABNORMAL HIGH (ref 70–99)
Potassium: 4.9 mmol/L (ref 3.5–5.1)
Sodium: 147 mmol/L — ABNORMAL HIGH (ref 135–145)
Total Bilirubin: 1 mg/dL (ref 0.3–1.2)
Total Protein: 5.9 g/dL — ABNORMAL LOW (ref 6.5–8.1)

## 2019-08-25 LAB — PROTIME-INR
INR: 1.4 — ABNORMAL HIGH (ref 0.8–1.2)
Prothrombin Time: 16.6 seconds — ABNORMAL HIGH (ref 11.4–15.2)

## 2019-08-25 LAB — GLUCOSE, CAPILLARY
Glucose-Capillary: 135 mg/dL — ABNORMAL HIGH (ref 70–99)
Glucose-Capillary: 145 mg/dL — ABNORMAL HIGH (ref 70–99)
Glucose-Capillary: 153 mg/dL — ABNORMAL HIGH (ref 70–99)
Glucose-Capillary: 164 mg/dL — ABNORMAL HIGH (ref 70–99)

## 2019-08-25 LAB — PHOSPHORUS
Phosphorus: 10.5 mg/dL — ABNORMAL HIGH (ref 2.5–4.6)
Phosphorus: 10.5 mg/dL — ABNORMAL HIGH (ref 2.5–4.6)

## 2019-08-25 LAB — PROCALCITONIN: Procalcitonin: 1.73 ng/mL

## 2019-08-25 LAB — APTT: aPTT: 24 seconds (ref 24–36)

## 2019-08-25 LAB — BRAIN NATRIURETIC PEPTIDE: B Natriuretic Peptide: 1464.6 pg/mL — ABNORMAL HIGH (ref 0.0–100.0)

## 2019-08-25 MED ORDER — HEPARIN SODIUM (PORCINE) 5000 UNIT/ML IJ SOLN
5000.0000 [IU] | Freq: Three times a day (TID) | INTRAMUSCULAR | Status: DC
  Administered 2019-08-25 – 2019-08-27 (×6): 5000 [IU] via SUBCUTANEOUS
  Filled 2019-08-25 (×6): qty 1

## 2019-08-25 MED ORDER — CHLORHEXIDINE GLUCONATE 0.12% ORAL RINSE (MEDLINE KIT)
15.0000 mL | Freq: Two times a day (BID) | OROMUCOSAL | Status: DC
  Administered 2019-08-25 – 2019-08-27 (×4): 15 mL via OROMUCOSAL

## 2019-08-25 MED ORDER — FENTANYL CITRATE (PF) 100 MCG/2ML IJ SOLN
50.0000 ug | Freq: Once | INTRAMUSCULAR | Status: DC
  Administered 2019-08-25: 50 ug via INTRAVENOUS

## 2019-08-25 MED ORDER — FENTANYL 2500MCG IN NS 250ML (10MCG/ML) PREMIX INFUSION
0.0000 ug/h | INTRAVENOUS | Status: DC
  Administered 2019-08-26: 75 ug/h via INTRAVENOUS
  Administered 2019-08-27: 400 ug/h via INTRAVENOUS
  Filled 2019-08-25: qty 250

## 2019-08-25 MED ORDER — FAMOTIDINE 40 MG/5ML PO SUSR
20.0000 mg | Freq: Every day | ORAL | Status: DC
  Administered 2019-08-26: 20 mg
  Filled 2019-08-25: qty 2.5

## 2019-08-25 MED ORDER — DOCUSATE SODIUM 100 MG PO CAPS: 100.0000 mg | ORAL_CAPSULE | Freq: Two times a day (BID) | ORAL | Status: DC | PRN

## 2019-08-25 MED ORDER — FENTANYL CITRATE (PF) 100 MCG/2ML IJ SOLN
INTRAMUSCULAR | Status: AC
  Administered 2019-08-25: 50 ug via INTRAVENOUS
  Filled 2019-08-25: qty 2

## 2019-08-25 MED ORDER — MIDAZOLAM HCL 2 MG/2ML IJ SOLN
1.0000 mg | Freq: Once | INTRAMUSCULAR | Status: AC
  Administered 2019-08-25: 1 mg via INTRAVENOUS

## 2019-08-25 MED ORDER — ORAL CARE MOUTH RINSE
15.0000 mL | OROMUCOSAL | Status: DC
  Administered 2019-08-25 – 2019-08-27 (×18): 15 mL via OROMUCOSAL

## 2019-08-25 MED ORDER — INSULIN ASPART 100 UNIT/ML ~~LOC~~ SOLN
0.0000 [IU] | SUBCUTANEOUS | Status: DC
  Administered 2019-08-25 – 2019-08-26 (×4): 3 [IU] via SUBCUTANEOUS
  Administered 2019-08-26: 2 [IU] via SUBCUTANEOUS
  Administered 2019-08-26 – 2019-08-27 (×4): 3 [IU] via SUBCUTANEOUS

## 2019-08-25 MED ORDER — DEXMEDETOMIDINE HCL IN NACL 400 MCG/100ML IV SOLN
0.4000 ug/kg/h | INTRAVENOUS | Status: DC
  Administered 2019-08-25: 0.4 ug/kg/h via INTRAVENOUS
  Administered 2019-08-25: 0.5 ug/kg/h via INTRAVENOUS
  Filled 2019-08-25 (×3): qty 100

## 2019-08-25 MED ORDER — ROCURONIUM BROMIDE 50 MG/5ML IV SOLN
50.0000 mg | Freq: Once | INTRAVENOUS | Status: AC
  Administered 2019-08-25: 50 mg via INTRAVENOUS

## 2019-08-25 MED ORDER — MIDAZOLAM HCL 2 MG/2ML IJ SOLN: 1.0000 mg | Freq: Once | INTRAMUSCULAR | Status: AC

## 2019-08-25 MED ORDER — POLYETHYLENE GLYCOL 3350 17 G PO PACK: 17.0000 g | PACK | Freq: Every day | ORAL | Status: DC | PRN

## 2019-08-25 MED ORDER — FAMOTIDINE 40 MG/5ML PO SUSR
20.0000 mg | Freq: Two times a day (BID) | ORAL | Status: DC
  Administered 2019-08-25: 20 mg
  Filled 2019-08-25: qty 2.5

## 2019-08-25 MED ORDER — FENTANYL CITRATE (PF) 100 MCG/2ML IJ SOLN
50.0000 ug | INTRAMUSCULAR | Status: DC | PRN
  Administered 2019-08-25: 50 ug via INTRAVENOUS
  Filled 2019-08-25 (×2): qty 2

## 2019-08-25 MED ORDER — MIDAZOLAM HCL 2 MG/2ML IJ SOLN
INTRAMUSCULAR | Status: AC
  Administered 2019-08-25: 1 mg via INTRAVENOUS
  Filled 2019-08-25: qty 2

## 2019-08-25 MED ORDER — FENTANYL CITRATE (PF) 100 MCG/2ML IJ SOLN: 25.0000 ug | Freq: Once | INTRAMUSCULAR | Status: DC

## 2019-08-25 MED ORDER — IPRATROPIUM-ALBUTEROL 0.5-2.5 (3) MG/3ML IN SOLN
3.0000 mL | Freq: Four times a day (QID) | RESPIRATORY_TRACT | Status: DC
  Administered 2019-08-25 – 2019-08-27 (×7): 3 mL via RESPIRATORY_TRACT
  Filled 2019-08-25 (×7): qty 3

## 2019-08-25 MED ORDER — BUDESONIDE 0.5 MG/2ML IN SUSP
0.5000 mg | Freq: Two times a day (BID) | RESPIRATORY_TRACT | Status: DC
  Administered 2019-08-25 – 2019-08-27 (×5): 0.5 mg via RESPIRATORY_TRACT
  Filled 2019-08-25 (×5): qty 2

## 2019-08-25 MED ORDER — ONDANSETRON HCL 4 MG/2ML IJ SOLN: 4.0000 mg | Freq: Four times a day (QID) | INTRAMUSCULAR | Status: DC | PRN

## 2019-08-25 MED ORDER — ETOMIDATE 2 MG/ML IV SOLN
20.0000 mg | Freq: Once | INTRAVENOUS | Status: AC
  Administered 2019-08-25: 20 mg via INTRAVENOUS

## 2019-08-25 MED ORDER — FENTANYL BOLUS VIA INFUSION
25.0000 ug | INTRAVENOUS | Status: DC | PRN
  Filled 2019-08-25: qty 25

## 2019-08-25 MED ORDER — PRO-STAT SUGAR FREE PO LIQD: 30.0000 mL | Freq: Two times a day (BID) | ORAL | Status: DC

## 2019-08-25 MED ORDER — FENTANYL CITRATE (PF) 100 MCG/2ML IJ SOLN: 50.0000 ug | Freq: Once | INTRAMUSCULAR | Status: AC

## 2019-08-25 MED ORDER — VITAL HIGH PROTEIN PO LIQD: 1000.0000 mL | ORAL | Status: DC

## 2019-08-25 MED ORDER — FENTANYL 2500MCG IN NS 250ML (10MCG/ML) PREMIX INFUSION
25.0000 ug/h | INTRAVENOUS | Status: DC
  Administered 2019-08-25: 75 ug/h via INTRAVENOUS
  Filled 2019-08-25 (×2): qty 250

## 2019-08-25 MED ORDER — METHYLPREDNISOLONE SODIUM SUCC 125 MG IJ SOLR
60.0000 mg | Freq: Two times a day (BID) | INTRAMUSCULAR | Status: DC
  Administered 2019-08-25 – 2019-08-27 (×4): 60 mg via INTRAVENOUS
  Filled 2019-08-25 (×4): qty 2

## 2019-08-25 NOTE — Consult Note (Signed)
[facility] Renal Consultation Note  Requesting MD: Tamala Julian-  CCM Indication for Consultation: AKI  HPI:  Brandi Gardner is a 85 y.o. female with past medical history significant for COPD on 3 L home oxygen, cardiomyopathy with ejection fraction at 20% with severe mitral regurgitation, failure to thrive with multiple hospitalizations of late.  Was last hospitalized earlier this month.  Reportedly, patient was found down brought to [facility] to need diuresis-responded well-but developed acute kidney injury with creatinine 4.1 and BUN of 154 with continued respiratory compromise.  For that reason she was transferred to [facility] hospital.  She has been seen by CCM, intubated.  Lung imaging is likely positive for new lung cancer.  Given that she has end-stage heart failure, advanced lung disease and failure to thrive they felt that patient was not a candidate for renal replacement therapy.  This has been discussed with family and patient is now DNR.  I was asked to provide an opinion regarding her suitability for renal replacement therapy.  Patient is currently intubated and sedated  Creatinine, Ser  Date/Time Value Ref Range Status  08/16/2019 05:07 PM 1.09 (H) 0.44 - 1.00 mg/dL Final  08/14/2019 08:22 PM 1.00 0.44 - 1.00 mg/dL Final  08/04/2019 08:14 AM 1.49 (H) 0.44 - 1.00 mg/dL Final  08/03/2019 05:01 AM 1.42 (H) 0.44 - 1.00 mg/dL Final  08/01/2019 04:58 AM 1.47 (H) 0.44 - 1.00 mg/dL Final  07/30/2019 02:27 AM 1.32 (H) 0.44 - 1.00 mg/dL Final  07/29/2019 12:21 PM 1.36 (H) 0.44 - 1.00 mg/dL Final  07/01/2019 05:53 AM 1.63 (H) 0.44 - 1.00 mg/dL Final  06/30/2019 05:23 AM 1.72 (H) 0.44 - 1.00 mg/dL Final  06/29/2019 05:05 AM 1.60 (H) 0.44 - 1.00 mg/dL Final  06/28/2019 09:42 AM 1.68 (H) 0.44 - 1.00 mg/dL Final  06/27/2019 05:34 PM 1.64 (H) 0.44 - 1.00 mg/dL Final  08/30/2017 03:26 AM 1.43 (H) 0.44 - 1.00 mg/dL Final  08/29/2017 02:44 AM 1.05 (H) 0.44 - 1.00 mg/dL Final   08/28/2017 03:37 AM 1.23 (H) 0.44 - 1.00 mg/dL Final  08/27/2017 02:52 AM 1.05 (H) 0.44 - 1.00 mg/dL Final  08/26/2017 09:55 AM 1.10 (H) 0.44 - 1.00 mg/dL Final  11/13/2015 10:15 AM 1.27 (H) 0.44 - 1.00 mg/dL Final     PMHx:   Past Medical History:  Diagnosis Date   Acute on chronic systolic (congestive) heart failure ([place]) 04/24/2015   AICD (automatic cardioverter/defibrillator) present    Anxiety    Cardiac defibrillator in place 04/25/2015   CHF (congestive heart failure) (H[facility])    Chronic obstructive pulmonary disease with acute exacerbation ([place]) 04/22/2015   COPD (chronic obstructive pulmonary disease) (HCC)    Coronary artery disease    Hx of pulmonary embolus 1980   Hyperlipidemia    Hypertension    Ischemic cardiomyopathy 05/24/2015   S/P CABG (coronary artery bypass graft) 05/24/2015    Past Surgical History:  Procedure Laterality Date   CARDIAC CATHETERIZATION     CORONARY ARTERY BYPASS GRAFT     IR GENERIC HISTORICAL  11/08/2015   IR RADIOLOGIST EVAL & MGMT 11/08/2015 [facility]   IR GENERIC HISTORICAL  11/13/2015   IR VERTEBROPLASTY LUMBAR BX INC UNI/BIL INC/INJECT/IMAGING 11/13/2015 Luanne Bras, MD [facility]   IR GENERIC HISTORICAL  12/07/2015   IR RADIOLOGIST EVAL & MGMT 12/07/2015 [facility]   TUBAL LIGATION      Family Hx:  Family History  Problem Relation Age of Onset   Hypertension Mother  Uterine cancer Mother    Hypertension Father     Social History:  reports that she quit smoking about 26 years ago. She has never used smokeless tobacco. She reports that she does not drink alcohol and does not use drugs.  Allergies:  Allergies  Allergen Reactions   Diphenhydramine Shortness Of Breath, Rash and Other (See Comments)    Has COPD, also    Doxycycline Itching   Hydrocodone Shortness Of Breath, Itching and Rash   Oxycodone Hives, Swelling and Other (See Comments)    Percocet = Throat Swelling caused her to go to  ER      Penicillins Other (See Comments)    Headache, childhood allergy    Potassium-Containing Compounds Other (See Comments)    Mouth/jaw pain   Tramadol Itching, Swelling, Rash and Other (See Comments)    Rash and Throat swelling caused her to go to ER    Hydrocodone-Acetaminophen Hives   Other Rash and Other (See Comments)    Oxygen tubing- BREAKS OUT THE SKIN IN RED, RAISED PATCHES   Percocet [Oxycodone-Acetaminophen] Hives   Vicodin [Hydrocodone-Acetaminophen] Hives   Potassium Other (See Comments)    Pain in the mouth/jaws   Statins Nausea Only    Medications: Prior to Admission medications   Medication Sig Start Date End Date Taking? Authorizing Provider  acetaminophen (TYLENOL) 500 MG tablet Take 500 mg by mouth every 6 (six) hours as needed for mild pain or headache.     [provider]  albuterol (PROAIR HFA) 108 (90 Base) MCG/ACT inhaler Inhale 2 puffs into the lungs every 6 (six) hours as needed for wheezing or shortness of breath.    [provider]  albuterol (PROVENTIL) (2.5 MG/3ML) 0.083% nebulizer solution Take 2.5 mg by nebulization in the morning and at bedtime.    [provider]  ALPRAZolam Duanne Moron) 1 MG tablet Take 1 tablet (1 mg total) by mouth 2 (two) times daily as needed for anxiety. Patient taking differently: Take 1 mg by mouth 2 (two) times daily.  08/30/17   Guadalupe Dawn, MD  aspirin EC 81 MG tablet Take 81 mg by mouth daily.    [provider]  benzonatate (TESSALON) 200 MG capsule Take 1 capsule (200 mg total) by mouth 3 (three) times daily as needed for cough. 08/05/19   Darliss Cheney, MD  budesonide (PULMICORT) 0.5 MG/2ML nebulizer solution Take 0.5 mg by nebulization in the morning and at bedtime.    [provider]  carvedilol (COREG) 3.125 MG tablet Take 3.125 mg by mouth 2 (two) times daily with a meal.    [provider]  cephALEXin (KEFLEX) 500 MG capsule Take 1 capsule (500 mg  total) by mouth 2 (two) times daily for 10 days. 08/16/19 08/26/19  Gareth Morgan, MD  diphenhydrAMINE (BENADRYL) 25 mg capsule Take 1 capsule (25 mg total) by mouth every 6 (six) hours as needed (for sinus symptoms/congestion). Patient not taking: Reported on 08/15/2019 08/05/19   Darliss Cheney, MD  ferrous sulfate 324 (65 Fe) MG TBEC Take 324 mg by mouth daily with breakfast.  06/09/17   [provider]  fluticasone (FLONASE) 50 MCG/ACT nasal spray Place 2 sprays into both nostrils daily as needed for allergies.     [provider]  fluticasone furoate-vilanterol (BREO ELLIPTA) 100-25 MCG/INH AEPB Inhale 1 puff into the lungs daily.    [provider]  furosemide (LASIX) 40 MG tablet Take 2 tablets (80 mg total) by mouth in the morning. 07/01/19  Geradine Girt, DO  isosorbide mononitrate (IMDUR) 30 MG 24 hr tablet Take 0.5 tablets (15 mg total) by mouth daily. 08/06/19 09/05/19  Darliss Cheney, MD  nitroGLYCERIN (NITROSTAT) 0.4 MG SL tablet Place 0.4 mg under the tongue every 5 (five) minutes as needed for chest pain.    [provider]  nystatin-triamcinolone (MYCOLOG II) cream Apply 1 application topically 2 (two) times daily as needed (for yeast).    [provider]  OXYGEN Inhale 2 L/min into the lungs continuous.    [provider]  pantoprazole (PROTONIX) 40 MG tablet Take 40 mg by mouth daily before breakfast.  06/09/17   [provider]  Probiotic CAPS Take 1 capsule by mouth daily.    [provider]  sertraline (ZOLOFT) 50 MG tablet Take 50 mg by mouth daily. 07/21/19   [provider]  tiotropium (SPIRIVA) 18 MCG inhalation capsule Place 18 mcg into inhaler and inhale daily.    [provider]  vitamin B-12 (CYANOCOBALAMIN) 100 MCG tablet Take 100 mcg by mouth daily.    [provider]  Vitamin D, Ergocalciferol, (DRISDOL) 1.25 MG (50000 UNIT) CAPS capsule Take 1 capsule (50,000 Units total) by  mouth every Tuesday. 08/09/19 09/08/19  Darliss Cheney, MD    I have reviewed the patient's current medications.  Labs:  Results for orders placed or performed during the hospital encounter of 08/14/2019 (from the past 48 hour(s))  Glucose, capillary     Status: Abnormal   Collection Time: 08/12/2019 11:32 AM  Result Value Ref Range   Glucose-Capillary 145 (H) 70 - 99 mg/dL    Comment: Glucose reference range applies only to samples taken after fasting for at least 8 hours.     ROS:  Review of systems not obtained due to patient factors.  Physical Exam: Vitals:   08/20/2019 1206 08/19/2019 1300  BP:  (!) 122/51  Pulse:  65  Resp:  (!) 21  Temp:    SpO2: 100% 97%     General: Thin, frail , intubated and sedated HEENT: Pupils are reactive-mucous membranes moist Neck: No JVD Heart: Regular rate and rhythm Lungs: Coarse breath sounds bilaterally Abdomen: Soft, nontender, nondistended Extremities: No significant peripheral edema Skin: Warm and dry Neuro: Sedated  Assessment/Plan: 85 year old with advanced COPD, cardiomyopathy and multiple recent hospitalizations.  She now has acute kidney injury in the setting of diuresis, respiratory failure 1.Renal-acute kidney injury in the setting of above.  Actually does not appear to be over diuresed at this time.  This is possibly a hemodynamically mediated acute kidney injury.  In any event, I agree completely with critical care's assessment that she would not be a candidate for renal replacement therapy even in the short-term given her other diagnosed comorbid conditions and probable new diagnosis of lung cancer 2. Hypertension/volume  -blood pressure is soft but reasonable.  She does not appear to be volume overloaded at this time.  Volume status per CCM 3. Anemia  -labs are pending.  Management per CCM  I have discussed this case with critical care medicine.  I agree with their assessment the patient is NOT a candidate for renal replacement  therapy.  We will not continue to follow the patient as critical care has things well in the hand, family coming in for further discussions regarding goals of care   Louis Meckel 09/03/2019, 1:57 PM

## 2019-08-25 NOTE — Progress Notes (Signed)
Spoke with son regarding multiorgan failure and new lung mass. We agreed to continue current level of care but if she were to pass away we would allow her to in peace without shocks or chest compressions. He is coming from [place] and should arrive here tomorrow to discuss next steps.  DNR  Erskine Emery MD PCCM

## 2019-08-25 NOTE — Procedures (Signed)
Central Venous Catheter Insertion Procedure Note Brandi Gardner [id number] 1934-07-19  Procedure: Insertion of Central Venous Catheter Indications: Assessment of intravascular volume, Drug and/or fluid administration and Frequent blood sampling  Procedure Details Consent: Unable to obtain consent because of altered level of consciousness. Time Out: Verified patient identification, verified procedure, site/side was marked, verified correct patient position, special equipment/implants available, medications/allergies/relevent history reviewed, required imaging and test results available.  Performed  Maximum sterile technique was used including antiseptics, cap, gloves, gown, hand hygiene, mask and sheet. Skin prep: Chlorhexidine; local anesthetic administered A antimicrobial bonded/coated triple lumen catheter was placed in the left internal jugular vein using the Seldinger technique.  Evaluation Blood flow good Complications: No apparent complications Patient did tolerate procedure well. Chest X-ray ordered to verify placement.  CXR: pending.  Johnsie Cancel, NP-C [facility] Contact / Pager information can be found on Amion  08/21/2019, 12:49 PM

## 2019-08-25 NOTE — H&P (Signed)
NAME:  Brandi Gardner, MRN:  [id number], DOB:  06-Jun-1934, LOS: 0 ADMISSION DATE:  08/24/2019, CONSULTATION DATE:  09/03/2019 REFERRING MD:  Oval Linsey, CHIEF COMPLAINT:  SOB   Brief History   Patient with a number of end-stage comorbidities presenting with acute on chronic hypoxemic and hypercarbic respiratory failure.  History of present illness   This is an 85 year old woman with a history of COPD on 3 L home oxygen, severe cardiomyopathy with an EF of 20%, chronic anxiety, chronic kidney disease who presented to [facility] ER after being found down, there she was diuresed for several days and is now in kidney failure with worsening respiratory status.  She was also treated with broad-spectrum antibiotics for potential H CAP despite a borderline procalcitonin.  She was also given steroids.  Today respiratory status continued to worsen so she was placed on BiPAP and a Lasix drip and transferred over to [facility] for further evaluation.  The patient is nearly comatose on BiPAP so history is per chart review.    The patient was recently admitted to [facility] from 5/21-5/28 for presumed AECOPD and refractory angina.  She was tx with imdur, prednisone, and lasix and improved.  She was seen by palliative care with a lengthy discussion.  She has had CPR in the past and is okay with receiving it again.  She remains full code with full scope of care.    Past Medical History  Severe ischemic cardiomyopathy with EF of 20% Severe mitral regurgitation Oxygen dependent COPD Frail elderly Chronic kidney disease  Significant Hospital Events   6/11 admitted to [facility] 6/17 transferred to [facility]  Consults:  N/A  Procedures:  ETT 6/17>> HD line 6/17>>  Significant Diagnostic Tests:  CXR: bilateral effusion, bilateral airspace disease CT Chest 6/15 personally reviewed: RUL mass, bilateral small effusions, severe emphysematous change, peribronchial thickening Renal US: medical renal disease CT Head  neg  Micro Data:  COVID Oval Linsey): neg Blood, urine cultures Oval Linsey neg  Antimicrobials:  Vanc/cefepime x 5 days at [place]  Interim history/subjective:  Admitted  Objective   Temperature (!) 96.8 F (36 C), temperature source Axillary.       No intake or output data in the 24 hours ending 09/02/2019 1142 There were no vitals filed for this visit.  Examination: General: ill appearing woman in acute respiratory distress HENT: BIPAP in place with good seal Lungs: Severely diminished BL, small effusions on [place], RR in 40s, + accessory muscle use Cardiovascular: RRR, ext warm Abdomen: Soft, hypoactive BS Extremities: No edema Neuro: She will withdraw x 4 to pain but not follow commands GU: foley in place with straw urine Skin: pale with bruising all over arms and torso  CBC WBC 7.7, HgB 9.3, Plts 256 BMP 143/5.1/106/19/154/4.1/124  Resolved Hospital Problem list   None  Assessment & Plan:  Acute on chronic hypercapneic and hypoxemic respiratory failure- failed treatment for fluid overload, failed treatment for pneumonia, failed treatment for AECOPD. Question acute decline related to an aspiration event. - Intubate, vent support, VAP prevention bundle - Steroids, nebs - Check Pct, monitor fever curve, I see no clear evidence of infection - Check BNP, bedside US before deciding on IVF vs. diuretics  End stage heart failure End stage lung failure Now end stage renal failure Severe muscular deconditioning New RUL lung mass c/w bronchogenic carcinoma  I do not think she is a candidate for dialysis given comorbidities, will touch base with nephrology regarding.  Will ask palliative to see again.  Overall  just watch on ventilator, if continues to decline needs to be allowed to pass in peace, I will keep trying to get in touch with family.  Best practice:  Diet: TF Pain/Anxiety/Delirium protocol (if indicated): Precedex, fentanyl VAP protocol (if indicated):  Ordered DVT prophylaxis: Heparin GI prophylaxis: Pepcid Glucose control: SSI Mobility: BR Code Status: Full Family Communication: Called son and left VM Disposition: ICU  Labs   CBC: No results for input(s): WBC, NEUTROABS, HGB, HCT, MCV, PLT in the last 168 hours.  Basic Metabolic Panel: No results for input(s): NA, K, CL, CO2, GLUCOSE, BUN, CREATININE, CALCIUM, MG, PHOS in the last 168 hours. GFR: Estimated Creatinine Clearance: 34.4 mL/min (A) (by C-G formula based on SCr of 1.09 mg/dL (H)). No results for input(s): PROCALCITON, WBC, LATICACIDVEN in the last 168 hours.  Liver Function Tests: No results for input(s): AST, ALT, ALKPHOS, BILITOT, PROT, ALBUMIN in the last 168 hours. No results for input(s): LIPASE, AMYLASE in the last 168 hours. No results for input(s): AMMONIA in the last 168 hours.  ABG    Component Value Date/Time   PHART 7.311 (L) 08/26/2017 1025   PCO2ART 52.1 (H) 08/26/2017 1025   PO2ART 150.0 (H) 08/26/2017 1025   HCO3 26.3 08/26/2017 1025   TCO2 28 08/26/2017 1025   O2SAT 99.0 08/26/2017 1025     Coagulation Profile: No results for input(s): INR, PROTIME in the last 168 hours.  Cardiac Enzymes: No results for input(s): CKTOTAL, CKMB, CKMBINDEX, TROPONINI in the last 168 hours.  HbA1C: Hgb A1c MFr Bld  Date/Time Value Ref Range Status  08/27/2017 02:52 AM 5.6 4.8 - 5.6 % Final    Comment:    (NOTE) Pre diabetes:          5.7%-6.4% Diabetes:              >6.4% Glycemic control for   <7.0% adults with diabetes     CBG: Recent Labs  Lab 09/07/2019 1132  GLUCAP 145*    Review of Systems:   Cannot assess, in extremis  Past Medical History  She,  has a past medical history of Acute on chronic systolic (congestive) heart failure ([place]) (04/24/2015), AICD (automatic cardioverter/defibrillator) present, Anxiety, Cardiac defibrillator in place (04/25/2015), CHF (congestive heart failure) ([place]), Chronic obstructive pulmonary disease with acute  exacerbation ([place]) (04/22/2015), COPD (chronic obstructive pulmonary disease) ([place]), Coronary artery disease, pulmonary embolus (1980), Hyperlipidemia, Hypertension, Ischemic cardiomyopathy (05/24/2015), and S/P CABG (coronary artery bypass graft) (05/24/2015).   Surgical History    Past Surgical History:  Procedure Laterality Date  . CARDIAC CATHETERIZATION    . CORONARY ARTERY BYPASS GRAFT    . IR GENERIC HISTORICAL  11/08/2015   IR RADIOLOGIST EVAL & MGMT 11/08/2015 [facility]  . IR GENERIC HISTORICAL  11/13/2015   IR VERTEBROPLASTY LUMBAR BX INC UNI/BIL INC/INJECT/IMAGING 11/13/2015 Luanne Bras, MD [facility]INTERV RAD  . IR GENERIC HISTORICAL  12/07/2015   IR RADIOLOGIST EVAL & MGMT 12/07/2015 [facility]  . TUBAL LIGATION       Social History   reports that she quit smoking about 26 years ago. She has never used smokeless tobacco. She reports that she does not drink alcohol and does not use drugs.   Family History   Her family history includes Hypertension in her father and mother; Uterine cancer in her mother.   Allergies Allergies  Allergen Reactions  . Diphenhydramine Shortness Of Breath, Rash and Other (See Comments)    Has COPD, also   . Doxycycline Itching  .  Hydrocodone Shortness Of Breath, Itching and Rash  . Oxycodone Hives, Swelling and Other (See Comments)    Percocet = Throat Swelling caused her to go to ER     . Penicillins Other (See Comments)    Headache, childhood allergy   . Potassium-Containing Compounds Other (See Comments)    Mouth/jaw pain  . Tramadol Itching, Swelling, Rash and Other (See Comments)    Rash and Throat swelling caused her to go to ER   . Hydrocodone-Acetaminophen Hives  . Other Rash and Other (See Comments)    Oxygen tubing- BREAKS OUT THE SKIN IN RED, RAISED PATCHES  . Percocet [Oxycodone-Acetaminophen] Hives  . Vicodin [Hydrocodone-Acetaminophen] Hives  . Potassium Other (See Comments)    Pain in the mouth/jaws  . Statins Nausea  Only     Home Medications  Prior to Admission medications   Medication Sig Start Date End Date Taking? Authorizing Provider  acetaminophen (TYLENOL) 500 MG tablet Take 500 mg by mouth every 6 (six) hours as needed for mild pain or headache.     [provider]  albuterol (PROAIR HFA) 108 (90 Base) MCG/ACT inhaler Inhale 2 puffs into the lungs every 6 (six) hours as needed for wheezing or shortness of breath.    [provider]  albuterol (PROVENTIL) (2.5 MG/3ML) 0.083% nebulizer solution Take 2.5 mg by nebulization in the morning and at bedtime.    [provider]  ALPRAZolam Duanne Moron) 1 MG tablet Take 1 tablet (1 mg total) by mouth 2 (two) times daily as needed for anxiety. Patient taking differently: Take 1 mg by mouth 2 (two) times daily.  08/30/17   Guadalupe Dawn, MD  aspirin EC 81 MG tablet Take 81 mg by mouth daily.    [provider]  benzonatate (TESSALON) 200 MG capsule Take 1 capsule (200 mg total) by mouth 3 (three) times daily as needed for cough. 08/05/19   Darliss Cheney, MD  budesonide (PULMICORT) 0.5 MG/2ML nebulizer solution Take 0.5 mg by nebulization in the morning and at bedtime.    [provider]  carvedilol (COREG) 3.125 MG tablet Take 3.125 mg by mouth 2 (two) times daily with a meal.    [provider]  cephALEXin (KEFLEX) 500 MG capsule Take 1 capsule (500 mg total) by mouth 2 (two) times daily for 10 days. 08/16/19 08/26/19  Gareth Morgan, MD  diphenhydrAMINE (BENADRYL) 25 mg capsule Take 1 capsule (25 mg total) by mouth every 6 (six) hours as needed (for sinus symptoms/congestion). Patient not taking: Reported on 08/15/2019 08/05/19   Darliss Cheney, MD  ferrous sulfate 324 (65 Fe) MG TBEC Take 324 mg by mouth daily with breakfast.  06/09/17   [provider]  fluticasone (FLONASE) 50 MCG/ACT nasal spray Place 2 sprays into both nostrils daily as needed for allergies.     [provider]  fluticasone  furoate-vilanterol (BREO ELLIPTA) 100-25 MCG/INH AEPB Inhale 1 puff into the lungs daily.    [provider]  furosemide (LASIX) 40 MG tablet Take 2 tablets (80 mg total) by mouth in the morning. 07/01/19   Geradine Girt, DO  isosorbide mononitrate (IMDUR) 30 MG 24 hr tablet Take 0.5 tablets (15 mg total) by mouth daily. 08/06/19 09/05/19  Darliss Cheney, MD  nitroGLYCERIN (NITROSTAT) 0.4 MG SL tablet Place 0.4 mg under the tongue every 5 (five) minutes as needed for chest pain.    [provider]  nystatin-triamcinolone (MYCOLOG II) cream Apply 1 application topically 2 (two) times daily  as needed (for yeast).    [provider]  OXYGEN Inhale 2 L/min into the lungs continuous.    [provider]  pantoprazole (PROTONIX) 40 MG tablet Take 40 mg by mouth daily before breakfast.  06/09/17   [provider]  Probiotic CAPS Take 1 capsule by mouth daily.    [provider]  sertraline (ZOLOFT) 50 MG tablet Take 50 mg by mouth daily. 07/21/19   [provider]  tiotropium (SPIRIVA) 18 MCG inhalation capsule Place 18 mcg into inhaler and inhale daily.    [provider]  vitamin B-12 (CYANOCOBALAMIN) 100 MCG tablet Take 100 mcg by mouth daily.    [provider]  Vitamin D, Ergocalciferol, (DRISDOL) 1.25 MG (50000 UNIT) CAPS capsule Take 1 capsule (50,000 Units total) by mouth every Tuesday. 08/09/19 09/08/19  Darliss Cheney, MD     Critical care time: 45 minutes

## 2019-08-25 NOTE — Procedures (Signed)
Intubation Procedure Note Brandi Gardner [id number] 1934/05/07  Procedure: Intubation Indications: Airway protection and maintenance  Procedure Details Consent: Unable to obtain consent because of altered level of consciousness. Time Out: Verified patient identification, verified procedure, site/side was marked, verified correct patient position, special equipment/implants available, medications/allergies/relevent history reviewed, required imaging and test results available.  Performed  Maximum sterile technique was used including cap, gloves, hand hygiene and mask. Glidescope 4 used to directly visualize vocal cords   Evaluation Hemodynamic Status: BP stable throughout; O2 sats: stable throughout Patient's Current Condition: stable Complications: No apparent complications Patient did tolerate procedure well. Chest X-ray ordered to verify placement.  CXR: pending.  Johnsie Cancel, NP-C  Pulmonary & Critical Care Contact / Pager information can be found on Amion  09/02/2019, 12:46 PM

## 2019-08-25 NOTE — Progress Notes (Signed)
Palliative Medicine RN Note: Consult order noted. Also note TOC order to locate surrogate.  We will see Brandi Gardner as soon as we have an available provider. Until then, please refer to the ACP documentation in the ACP tab for information on pt's desire for her [place] and desire for surrogate decision-maker (as discussed with our team 08/02/19).  Marjie Skiff Estanislado Surgeon, RN, BSN, [facility] Palliative Medicine Team 08/17/2019 1:23 PM Office [phone]

## 2019-08-26 DIAGNOSIS — J80 Acute respiratory distress syndrome: Secondary | ICD-10-CM

## 2019-08-26 LAB — BASIC METABOLIC PANEL
Anion gap: 19 — ABNORMAL HIGH (ref 5–15)
BUN: 176 mg/dL — ABNORMAL HIGH (ref 8–23)
CO2: 21 mmol/L — ABNORMAL LOW (ref 22–32)
Calcium: 8 mg/dL — ABNORMAL LOW (ref 8.9–10.3)
Chloride: 107 mmol/L (ref 98–111)
Creatinine, Ser: 4.91 mg/dL — ABNORMAL HIGH (ref 0.44–1.00)
GFR calc Af Amer: 9 mL/min — ABNORMAL LOW (ref >60)
GFR calc non Af Amer: 8 mL/min — ABNORMAL LOW (ref >60)
Glucose, Bld: 183 mg/dL — ABNORMAL HIGH (ref 70–99)
Potassium: 5 mmol/L (ref 3.5–5.1)
Sodium: 147 mmol/L — ABNORMAL HIGH (ref 135–145)

## 2019-08-26 LAB — CBC
HCT: 25.7 % — ABNORMAL LOW (ref 36.0–46.0)
Hemoglobin: 8.2 g/dL — ABNORMAL LOW (ref 12.0–15.0)
MCH: 28.9 pg (ref 26.0–34.0)
MCHC: 31.9 g/dL (ref 30.0–36.0)
MCV: 90.5 fL (ref 80.0–100.0)
Platelets: 236 10*3/uL (ref 150–400)
RBC: 2.84 MIL/uL — ABNORMAL LOW (ref 3.87–5.11)
RDW: 15.2 % (ref 11.5–15.5)
WBC: 5.5 10*3/uL (ref 4.0–10.5)
nRBC: 0 % (ref 0.0–0.2)

## 2019-08-26 LAB — GLUCOSE, CAPILLARY
Glucose-Capillary: 87 mg/dL (ref 70–99)
Glucose-Capillary: 164 mg/dL — ABNORMAL HIGH (ref 70–99)
Glucose-Capillary: 173 mg/dL — ABNORMAL HIGH (ref 70–99)
Glucose-Capillary: 174 mg/dL — ABNORMAL HIGH (ref 70–99)
Glucose-Capillary: 167 mg/dL — ABNORMAL HIGH (ref 70–99)

## 2019-08-26 LAB — PHOSPHORUS
Phosphorus: 8.2 mg/dL — ABNORMAL HIGH (ref 2.5–4.6)
Phosphorus: 8.4 mg/dL — ABNORMAL HIGH (ref 2.5–4.6)

## 2019-08-26 LAB — MAGNESIUM
Magnesium: 3.2 mg/dL — ABNORMAL HIGH (ref 1.7–2.4)
Magnesium: 3.1 mg/dL — ABNORMAL HIGH (ref 1.7–2.4)

## 2019-08-26 LAB — HEMOGLOBIN A1C
Hgb A1c MFr Bld: 6.3 % — ABNORMAL HIGH (ref 4.8–5.6)
Mean Plasma Glucose: 134 mg/dL

## 2019-08-26 MED ORDER — DEXTROSE 5 % IV SOLN
500.0000 mg | Freq: Once | INTRAVENOUS | Status: AC
  Administered 2019-08-26: 500 mg via INTRAVENOUS
  Filled 2019-08-26: qty 500

## 2019-08-26 MED ORDER — CHLORHEXIDINE GLUCONATE CLOTH 2 % EX PADS
6.0000 | MEDICATED_PAD | Freq: Every day | CUTANEOUS | Status: DC
  Administered 2019-08-27: 6 via TOPICAL

## 2019-08-26 MED ORDER — LORAZEPAM 2 MG/ML IJ SOLN
2.0000 mg | Freq: Once | INTRAMUSCULAR | Status: AC
  Administered 2019-08-26: 2 mg via INTRAVENOUS

## 2019-08-26 MED ORDER — FREE WATER
300.0000 mL | Freq: Four times a day (QID) | Status: DC
  Administered 2019-08-26 – 2019-08-27 (×4): 300 mL

## 2019-08-26 MED ORDER — LORAZEPAM 2 MG/ML IJ SOLN: 2.0000 mg | INTRAMUSCULAR | Status: DC | PRN

## 2019-08-26 MED ORDER — LORAZEPAM 2 MG/ML IJ SOLN
INTRAMUSCULAR | Status: AC
  Filled 2019-08-26: qty 1

## 2019-08-26 NOTE — Progress Notes (Signed)
[facility]:  Pt is current pt with [facility] a home based pallitive care program provided by [place].   Webb Silversmith RN [phone]   If the pt son decides to pursue comfort and pt stable for transfer please call for assistance to [place]. Our inpt facility. Thank you!

## 2019-08-26 NOTE — Progress Notes (Signed)
Nutrition Brief Note  Chart reviewed. Pt is at end of life. Son is working to get down here from [place] to say his goodbyes, son would like patient to be allowed to pass in peace when he gets here.   No further nutrition interventions warranted at this time.  Please re-consult as needed.   Kerman Passey MS, RDN, LDN, CNSC Registered Dietitian III RD Pager Number and RD On-Call Pager Number Located in [place]

## 2019-08-26 NOTE — Progress Notes (Signed)
The chaplain understands the Monsignor from [place] will visit the Pt. for [place] this afternoon. The chaplain was pastorally present and updated the family. The Pt. RN-Morgan has been updated as well, to assist with Monsignor admittance.  F/U spiritual care is available as needed.

## 2019-08-26 NOTE — Progress Notes (Signed)
NAME:  Brandi Gardner, MRN:  [id number], DOB:  [birth year], LOS: 1 ADMISSION DATE:  08/11/2019, CONSULTATION DATE:  09/05/2019 REFERRING MD:  Oval Linsey, CHIEF COMPLAINT:  SOB   Brief History   Patient with a number of end-stage comorbidities presenting with acute on chronic hypoxemic and hypercarbic respiratory failure.  History of present illness   This is an 85 year old woman with a history of COPD on 3 L home oxygen, severe cardiomyopathy with an EF of 20%, chronic anxiety, chronic kidney disease who presented to [facility] ER after being found down, there she was diuresed for several days and is now in kidney failure with worsening respiratory status.  She was also treated with broad-spectrum antibiotics for potential H CAP despite a borderline procalcitonin.  She was also given steroids.  Today respiratory status continued to worsen so she was placed on BiPAP and a Lasix drip and transferred over to [facility] for further evaluation.  The patient is nearly comatose on BiPAP so history is per chart review.    The patient was recently admitted to [facility] from 5/21-5/28 for presumed AECOPD and refractory angina.  She was tx with imdur, prednisone, and lasix and improved.  She was seen by palliative care with a lengthy discussion.  She has had CPR in the past and is okay with receiving it again.  She remains full code with full scope of care.    Past Medical History  Severe ischemic cardiomyopathy with EF of 20% Severe mitral regurgitation Oxygen dependent COPD Frail elderly Chronic kidney disease  Significant Hospital Events   6/11 admitted to [facility] 6/17 transferred to [facility]  Consults:  N/A  Procedures:  ETT 6/17>> HD line 6/17>>  Significant Diagnostic Tests:  CXR: bilateral effusion, bilateral airspace disease CT Chest 6/15 personally reviewed: RUL mass, bilateral small effusions, severe emphysematous change, peribronchial thickening Renal US: medical renal disease CT Head  neg  Micro Data:  COVID Oval Linsey): neg Blood, urine cultures Oval Linsey neg  Antimicrobials:  Vanc/cefepime x 5 days at [place]  Interim history/subjective:  No events, remains obtunded on vent.  Objective   Blood pressure (!) 112/45, pulse (!) 53, temperature 97.6 F (36.4 C), temperature source Axillary, resp. rate (!) 22, height 5\' 4"  (1.626 m), weight 62.4 kg, SpO2 97 %.    Vent Mode: PRVC FiO2 (%):  [40 %-60 %] 40 % Set Rate:  [12 bmp-22 bmp] 22 bmp Vt Set:  [430 mL] 430 mL PEEP:  [5 cmH20-6 cmH20] 5 cmH20 Plateau Pressure:  [24 cmH20-27 cmH20] 27 cmH20   Intake/Output Summary (Last 24 hours) at 08/26/2019 0751 Last data filed at 08/26/2019 0600 Gross per 24 hour  Intake 114.08 ml  Output 550 ml  Net -435.92 ml   Filed Weights   08/26/19 0454  Weight: 62.4 kg    Examination: General: ill appearing woman on vent HENT: ETT with small secretions Lungs: Severely diminished BL, small effusions on [place] 6/17 Cardiovascular: RRR, ext warm Abdomen: Soft, hypoactive BS Extremities: No edema Neuro: She will withdraw x 4 to pain but not follow commands GU: foley in place with straw urine Skin: pale with bruising all over arms and torso  Labs this am pending  Resolved Hospital Problem list   None  Assessment & Plan:  Acute on chronic hypercapneic and hypoxemic respiratory failure- failed treatment for fluid overload, failed treatment for pneumonia, failed treatment for AECOPD. Question acute decline related to an aspiration event. - Intubate, vent support, VAP prevention bundle - Steroids, nebs -  monitor fever curve, I see no clear evidence of infection - Bedside US today to see if we should try diuresis or leave alone  End stage heart failure End stage lung failure Now end stage renal failure Severe muscular deconditioning New RUL lung mass c/w bronchogenic carcinoma Not a candidate for dialysis given comorbidities. At end of life.  Ativan PRN, fentanyl PRN,  precedex gtt  Son [name] is working on getting down here from [place] to say his goodbyes.  He should arrive by early tomorrow.DNR.  [name] would like patient to be allowed to pass in peace when he gets here.  Best practice:  Diet: TF Pain/Anxiety/Delirium protocol (if indicated): Precedex, fentanyl VAP protocol (if indicated): Ordered DVT prophylaxis: Heparin GI prophylaxis: Pepcid Glucose control: SSI Mobility: BR Code Status: Full Family Communication: called and updated Disposition: ICU  The patient is critically ill with multiple organ systems failure and requires high complexity decision making for assessment and support, frequent evaluation and titration of therapies, application of advanced monitoring technologies and extensive interpretation of multiple databases. Critical Care Time devoted to patient care services described in this note independent of APP/resident time (if applicable)  is 35 minutes.   Erskine Emery MD [facility] 08/26/2019 7:58 AM Personal pager: [phone] If unanswered, please page CCM On-call: [phone]

## 2019-08-26 NOTE — Progress Notes (Signed)
° °  Palliative Medicine Inpatient Follow Up Note   Patients son is, Kathreen Dileo is driving down from [place] in the setting of Norberta's multiorgan failure. Plan for liberation from ventilation tomorrow. Dr. Tamala Julian endorses that he has completed [place] discussions with patients family and we are no long needed to aid in this patients case.   Touched base with bedside RN, Lilia Pro who states that [place] is Catholic and will need to receive last rights. Spiritual has been consulted.   Time Spent: No Charge Greater than 50% of the time was spent in counseling and coordination of care ______________________________________________________________________________________ [place] Team Team Cell Phone: [phone] Please utilize secure chat with additional questions, if there is no response within 30 minutes please call the above phone number  Palliative Medicine Team providers are available by phone from 7am to 7pm daily and can be reached through the team cell phone.  Should this patient require assistance outside of these hours, please call the patient's attending physician.

## 2019-08-26 NOTE — Progress Notes (Signed)
This chaplain responded to PMT consult for Pt. "Last Rites/Sacrament of the Sick." The chaplain conferenced with RN-Brandi Gardner.   The chaplain verified with the Pt. grand-daughter through the Pt. sister 'n law-Brandi Gardner, the Pt. is Brandi Gardner.  The Chaplain phoned [place] [phone] and spoke to Brandi Gardner.  Brandi Gardner requested information about the Pt. local parish.  The chaplain left a voicemail with the Pt. son-Brandi Gardner for additional information.

## 2019-08-27 DIAGNOSIS — J9622 Acute and chronic respiratory failure with hypercapnia: Secondary | ICD-10-CM

## 2019-08-27 DIAGNOSIS — L8990 Pressure ulcer of unspecified site, unspecified stage: Secondary | ICD-10-CM | POA: Insufficient documentation

## 2019-08-27 DIAGNOSIS — J9621 Acute and chronic respiratory failure with hypoxia: Principal | ICD-10-CM

## 2019-08-27 LAB — CBC
HCT: 25.5 % — ABNORMAL LOW (ref 36.0–46.0)
Hemoglobin: 8.1 g/dL — ABNORMAL LOW (ref 12.0–15.0)
MCH: 28.8 pg (ref 26.0–34.0)
MCHC: 31.8 g/dL (ref 30.0–36.0)
MCV: 90.7 fL (ref 80.0–100.0)
Platelets: 282 10*3/uL (ref 150–400)
RBC: 2.81 MIL/uL — ABNORMAL LOW (ref 3.87–5.11)
RDW: 15.5 % (ref 11.5–15.5)
WBC: 7.7 10*3/uL (ref 4.0–10.5)
nRBC: 0 % (ref 0.0–0.2)

## 2019-08-27 LAB — PHOSPHORUS: Phosphorus: 7.9 mg/dL — ABNORMAL HIGH (ref 2.5–4.6)

## 2019-08-27 LAB — CULTURE, RESPIRATORY W GRAM STAIN: Gram Stain: NONE SEEN

## 2019-08-27 LAB — MAGNESIUM: Magnesium: 3.2 mg/dL — ABNORMAL HIGH (ref 1.7–2.4)

## 2019-08-27 LAB — GLUCOSE, CAPILLARY
Glucose-Capillary: 166 mg/dL — ABNORMAL HIGH (ref 70–99)
Glucose-Capillary: 176 mg/dL — ABNORMAL HIGH (ref 70–99)
Glucose-Capillary: 191 mg/dL — ABNORMAL HIGH (ref 70–99)

## 2019-08-27 MED ORDER — MIDAZOLAM 50MG/50ML (1MG/ML) PREMIX INFUSION
0.5000 mg/h | INTRAVENOUS | Status: DC
  Administered 2019-08-27: 2 mg/h via INTRAVENOUS
  Filled 2019-08-27: qty 50

## 2019-08-27 MED ORDER — ACETAMINOPHEN 325 MG PO TABS: 650.0000 mg | ORAL_TABLET | Freq: Four times a day (QID) | ORAL | Status: DC | PRN

## 2019-08-27 MED ORDER — GLYCOPYRROLATE 0.2 MG/ML IJ SOLN: 0.2000 mg | INTRAMUSCULAR | Status: DC | PRN

## 2019-08-27 MED ORDER — ACETAMINOPHEN 650 MG RE SUPP: 650.0000 mg | Freq: Four times a day (QID) | RECTAL | Status: DC | PRN

## 2019-08-27 MED ORDER — GLYCOPYRROLATE 1 MG PO TABS: 1.0000 mg | ORAL_TABLET | ORAL | Status: DC | PRN

## 2019-08-27 MED ORDER — DIPHENHYDRAMINE HCL 50 MG/ML IJ SOLN: 25.0000 mg | INTRAMUSCULAR | Status: DC | PRN

## 2019-08-27 MED ORDER — DEXTROSE 5 % IV SOLN: INTRAVENOUS | Status: DC

## 2019-08-27 MED ORDER — POLYVINYL ALCOHOL 1.4 % OP SOLN
1.0000 [drp] | Freq: Four times a day (QID) | OPHTHALMIC | Status: DC | PRN
  Filled 2019-08-27: qty 15

## 2019-09-08 NOTE — Progress Notes (Signed)
This RN wasted 150 ccs of Fentanyl and 38 ccs of Versed in sink with Daron Offer, RN.

## 2019-09-08 NOTE — Progress Notes (Signed)
NAME:  Brandi Gardner, MRN:  [id number], DOB:  1934/09/11, LOS: 2 ADMISSION DATE:  09/04/2019, CONSULTATION DATE:  08/20/2019 REFERRING MD:  Oval Linsey, CHIEF COMPLAINT:  SOB   Brief History   Patient with a number of end-stage comorbidities presenting with acute on chronic hypoxemic and hypercarbic respiratory failure.  History of present illness   This is an 85 year old woman with a history of COPD on 3 L home oxygen, severe cardiomyopathy with an EF of 20%, chronic anxiety, chronic kidney disease who presented to [facility] ER after being found down, there she was diuresed for several days and is now in kidney failure with worsening respiratory status.  She was also treated with broad-spectrum antibiotics for potential H CAP despite a borderline procalcitonin.  She was also given steroids.  Today respiratory status continued to worsen so she was placed on BiPAP and a Lasix drip and transferred over to [facility] for further evaluation.  The patient is nearly comatose on BiPAP so history is per chart review.    The patient was recently admitted to [facility] from 5/21-5/28 for presumed AECOPD and refractory angina.  She was tx with imdur, prednisone, and lasix and improved.  She was seen by palliative care with a lengthy discussion.  She has had CPR in the past and is okay with receiving it again.  She remains full code with full scope of care.    Past Medical History  Severe ischemic cardiomyopathy with EF of 20% Severe mitral regurgitation Oxygen dependent COPD Frail elderly Chronic kidney disease  Significant Hospital Events   6/11 admitted to [facility] 6/17 transferred to [facility]  Consults:  N/A  Procedures:  ETT 6/17>> HD line 6/17>>  Significant Diagnostic Tests:  CXR: bilateral effusion, bilateral airspace disease CT Chest 6/15 personally reviewed: RUL mass, bilateral small effusions, severe emphysematous change, peribronchial thickening Renal US: medical renal disease CT Head  neg  Micro Data:  COVID Oval Linsey): neg Blood, urine cultures Oval Linsey neg  Antimicrobials:  Vanc/cefepime x 5 days at [place]  Interim history/subjective:  No events, remains obtunded on vent.  Patient transition to comfort care at the son's request.  Objective   Blood pressure (!) 93/49, pulse 88, temperature 98.9 F (37.2 C), temperature source Axillary, resp. rate (!) 0, height 5\' 4"  (1.626 m), weight 62.4 kg, SpO2 (!) 77 %.    Vent Mode: PRVC FiO2 (%):  [40 %] 40 % Set Rate:  [22 bmp] 22 bmp Vt Set:  [430 mL] 430 mL PEEP:  [5 cmH20] 5 cmH20 Plateau Pressure:  [21 cmH20-24 cmH20] 24 cmH20   Intake/Output Summary (Last 24 hours) at 08/28/19 1706 Last data filed at 2019/08/28 1200 Gross per 24 hour  Intake 156.89 ml  Output 920 ml  Net -763.11 ml   Filed Weights   08/26/19 0454 August 28, 2019 0409 2019/08/28 1600  Weight: 62.4 kg 62.4 kg 62.4 kg    Examination: General: ill appearing extubated.Marland Kitchen HENT: ETT with small secretions Lungs: Comfortable with little air hunger on fentanyl and Versed infusion. Cardiovascular: RRR, ext warm Abdomen: Soft, hypoactive BS Extremities: No edema Neuro: Unresponsive to all stimuli    Labs this am pending  Resolved Hospital Problem list   None  Assessment & Plan:  Acute on chronic hypercapneic and hypoxemic respiratory failure- failed treatment for fluid overload, failed treatment for pneumonia, failed treatment for AECOPD. Question acute decline related to an aspiration event. - Intubate, vent support, VAP prevention bundle - Steroids, nebs - monitor fever curve, I see no  clear evidence of infection - Bedside US today to see if we should try diuresis or leave alone  End stage heart failure End stage lung failure Now end stage renal failure Severe muscular deconditioning New RUL lung mass c/w bronchogenic carcinoma Not a candidate for dialysis given comorbidities. At end of life.  Fentanyl infusion, midazolam  infusion.  Patient has passed away on comfort care.  Best practice:  Diet: TF Pain/Anxiety/Delirium protocol (if indicated): Precedex, fentanyl VAP protocol (if indicated): Ordered DVT prophylaxis: Heparin GI prophylaxis: Pepcid Glucose control: SSI Mobility: BR Code Status: Full Family Communication: called and updated Disposition: ICU  Kipp Brood, MD [facility] ICU Physician [place]  Pager: [phone] Mobile: [phone] After hours: [phone].  2019/09/08, 5:07 PM

## 2019-09-08 NOTE — Death Summary Note (Signed)
DEATH SUMMARY   Patient Details  Name: Brandi Gardner MRN: [id number] DOB: 1934-05-15  Admission/Discharge Information   Admit Date:  08-29-19  Date of Death: Date of Death: 2019-08-31  Time of Death: Time of Death: May 22, 1551  Length of Stay: 2  Referring Physician: Clovia Cuff, MD   Reason(s) for Hospitalization  Acute hypoxic hypercarbic respiratory failure.  Diagnoses  Preliminary cause of death: Respiratory failure. Secondary Diagnoses (including complications and co-morbidities):  Active Problems:   Respiratory failure (HCC)   ARDS (adult respiratory distress syndrome) (HCC)   Pressure injury of skin   Brief Hospital Course (including significant findings, care, treatment, and services provided and events leading to death)  Brandi Gardner is a 85 y.o. year old female who has a history of COPD on 3 L home oxygen, severe cardiomyopathy with an EF of 20%, chronic anxiety, chronic kidney disease who presented to [facility] ER after being found down, there she was diuresed for several days and is now in kidney failure with worsening respiratory status.  She was also treated with broad-spectrum antibiotics for potential H CAP despite a borderline procalcitonin.  She was also given steroids.  She was transferred to [facility] for refractory respiratory failure.  Minimal improvement so she was transitioned to comfort care at the family's request and passed away with no distress.  Pertinent Labs and Studies  Significant Diagnostic Studies DG Chest 2 View  Result Date: 08/16/2019 CLINICAL DATA:  Shortness of breath. EXAM: CHEST - 2 VIEW COMPARISON:  Single-view of the chest 08/05/2019 and 07/31/2019. FINDINGS: Small pleural effusions are seen, greater on the right. There is associated basilar atelectasis. Heart size is upper normal. No pulmonary edema or pneumothorax. The patient is status post CABG with an AICD in place. IMPRESSION: Small bilateral pleural effusions and basilar atelectasis  are greater on the right and not notably changed. Electronically Signed   By: Inge Rise M.D.   On: 08/16/2019 16:51   DG Abd 1 View  Result Date: 08-29-19 CLINICAL DATA:  Nasogastric tube placement EXAM: ABDOMEN - 1 VIEW COMPARISON:  Jul 24, 2017. FINDINGS: Nasogastric tube tip and side port are in the proximal body of the stomach. There is no bowel dilatation or air-fluid level to suggest bowel obstruction. There is moderate stool in the colon. No free air evident. There are pleural effusions and consolidation in each lung base. IMPRESSION: Nasogastric tube tip and side port in proximal stomach slightly beyond the gastroesophageal junction. No bowel obstruction or free air appreciable. Moderate stool in colon. Pleural effusions bilaterally with bibasilar atelectasis noted. Electronically Signed   By: Lowella Grip III M.D.   On: August 29, 2019 13:24   Portable Chest x-ray  Result Date: 08/29/2019 CLINICAL DATA:  PICC placement. EXAM: PORTABLE CHEST 1 VIEW COMPARISON:  August 23, 2019. FINDINGS: Stable cardiomediastinal silhouette. Endotracheal and nasogastric tubes are unchanged in position. Status post coronary bypass graft. Left-sided pacemaker is unchanged in position. Interval placement of left internal jugular catheter with distal tip in expected position of the SVC. No pneumothorax is noted. Stable bibasilar edema or infiltrates are noted with associated pleural effusions. Bony thorax is unremarkable. IMPRESSION: Interval placement of left internal jugular catheter with distal tip in expected position of the SVC. Stable bibasilar edema or infiltrates are noted with associated pleural effusions. Electronically Signed   By: Marijo Conception M.D.   On: 29-Aug-2019 13:28    Microbiology No results found for this or any previous visit (from the past 240 hour(s)).  Lab  Basic Metabolic Panel: No results for input(s): NA, K, CL, CO2, GLUCOSE, BUN, CREATININE, CALCIUM, MG, PHOS in the last 168  hours. Liver Function Tests: No results for input(s): AST, ALT, ALKPHOS, BILITOT, PROT, ALBUMIN in the last 168 hours. No results for input(s): LIPASE, AMYLASE in the last 168 hours. No results for input(s): AMMONIA in the last 168 hours. CBC: No results for input(s): WBC, NEUTROABS, HGB, HCT, MCV, PLT in the last 168 hours. Cardiac Enzymes: No results for input(s): CKTOTAL, CKMB, CKMBINDEX, TROPONINI in the last 168 hours. Sepsis Labs: No results for input(s): PROCALCITON, WBC, LATICACIDVEN in the last 168 hours.  Procedures/Operations  BiPAP.   Charleen Madera 09/06/2019, 1:19 PM

## 2019-09-08 DEATH — deceased

## 2021-01-08 IMAGING — DX DG CHEST 2V
2 series · 2 of 2 positions shown · non-contrast
Comparison: Chest radiograph dated 06/19/2019.

CLINICAL DATA: 84-year-old female with CHF exacerbation.

EXAM:
CHEST - 2 VIEW

[chest lat]
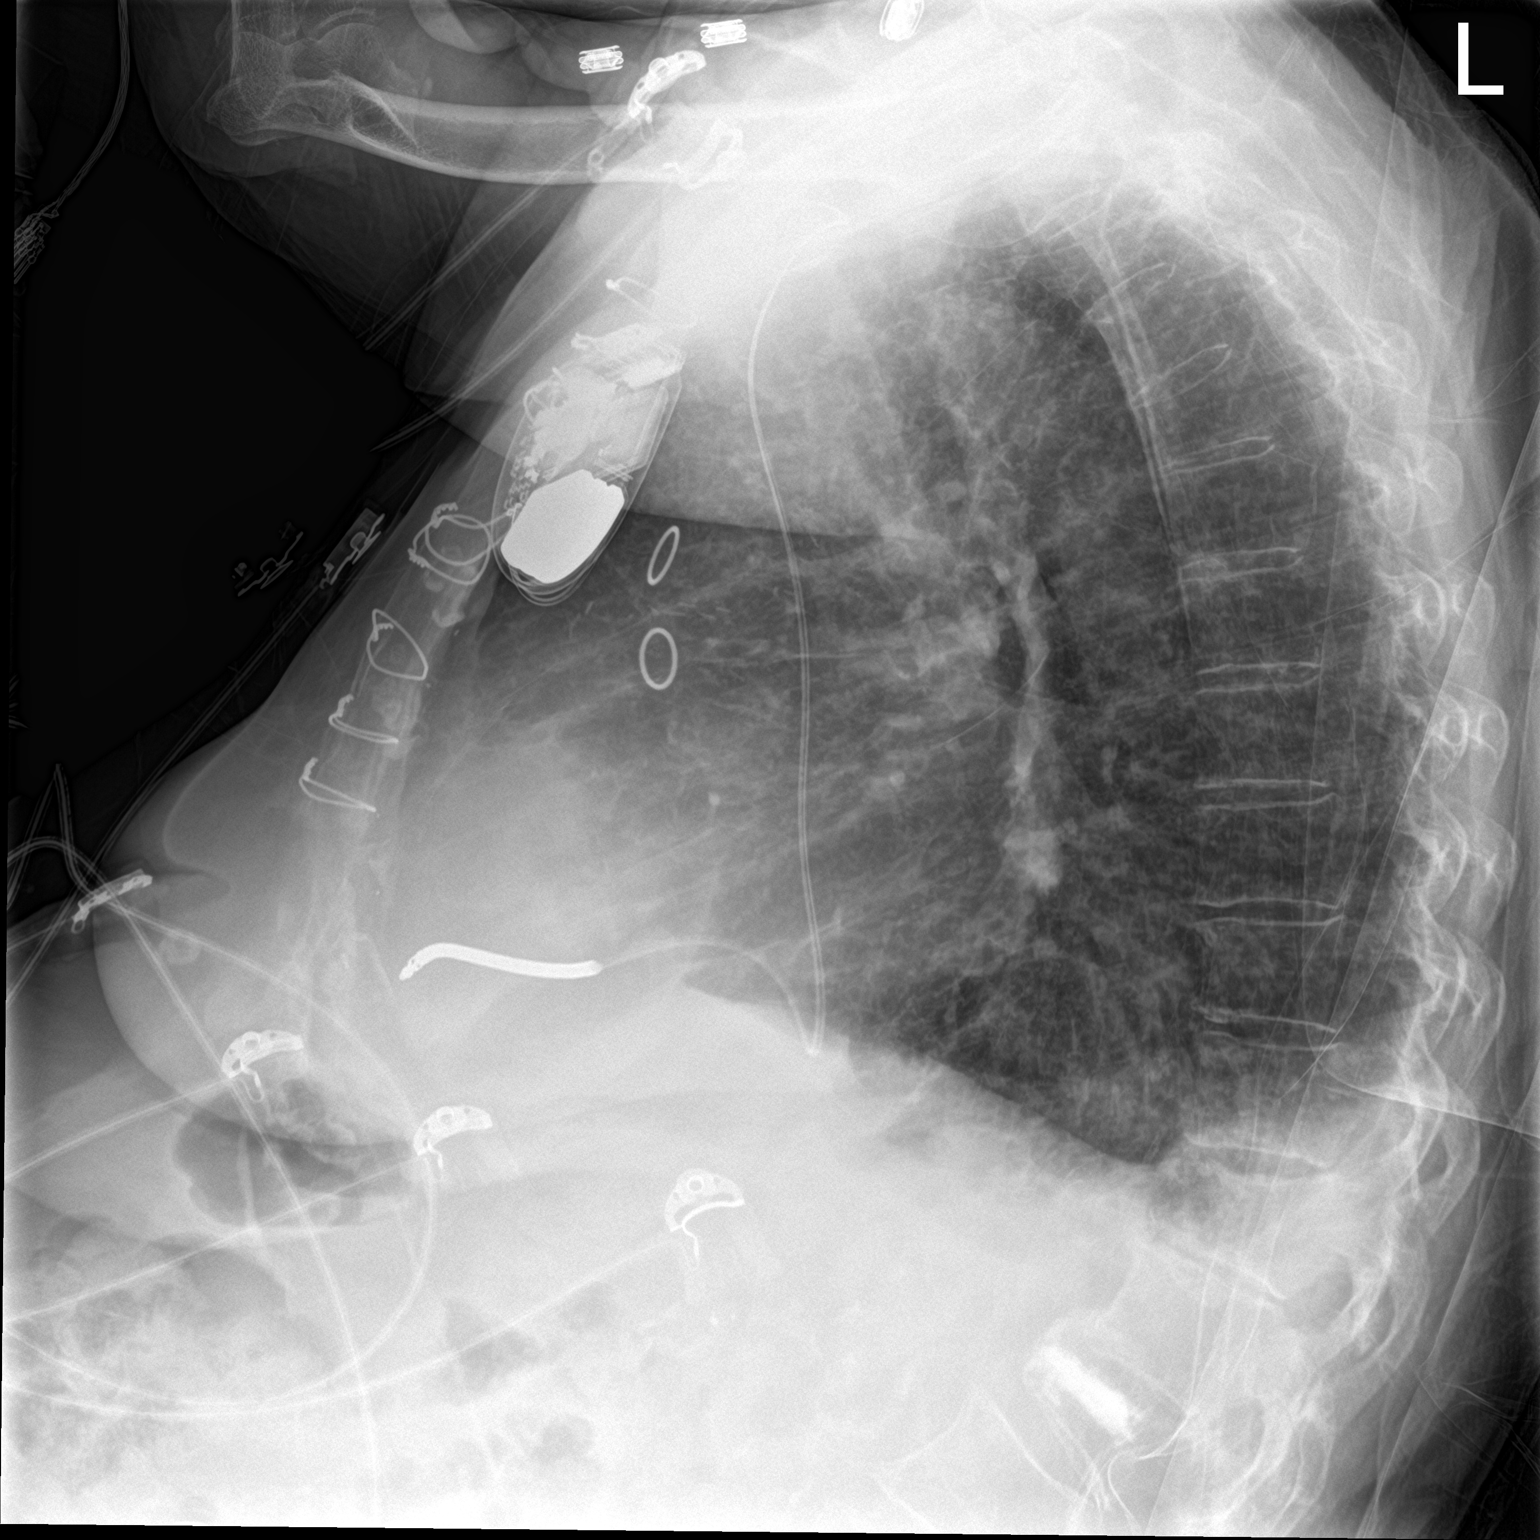

[chest ap]
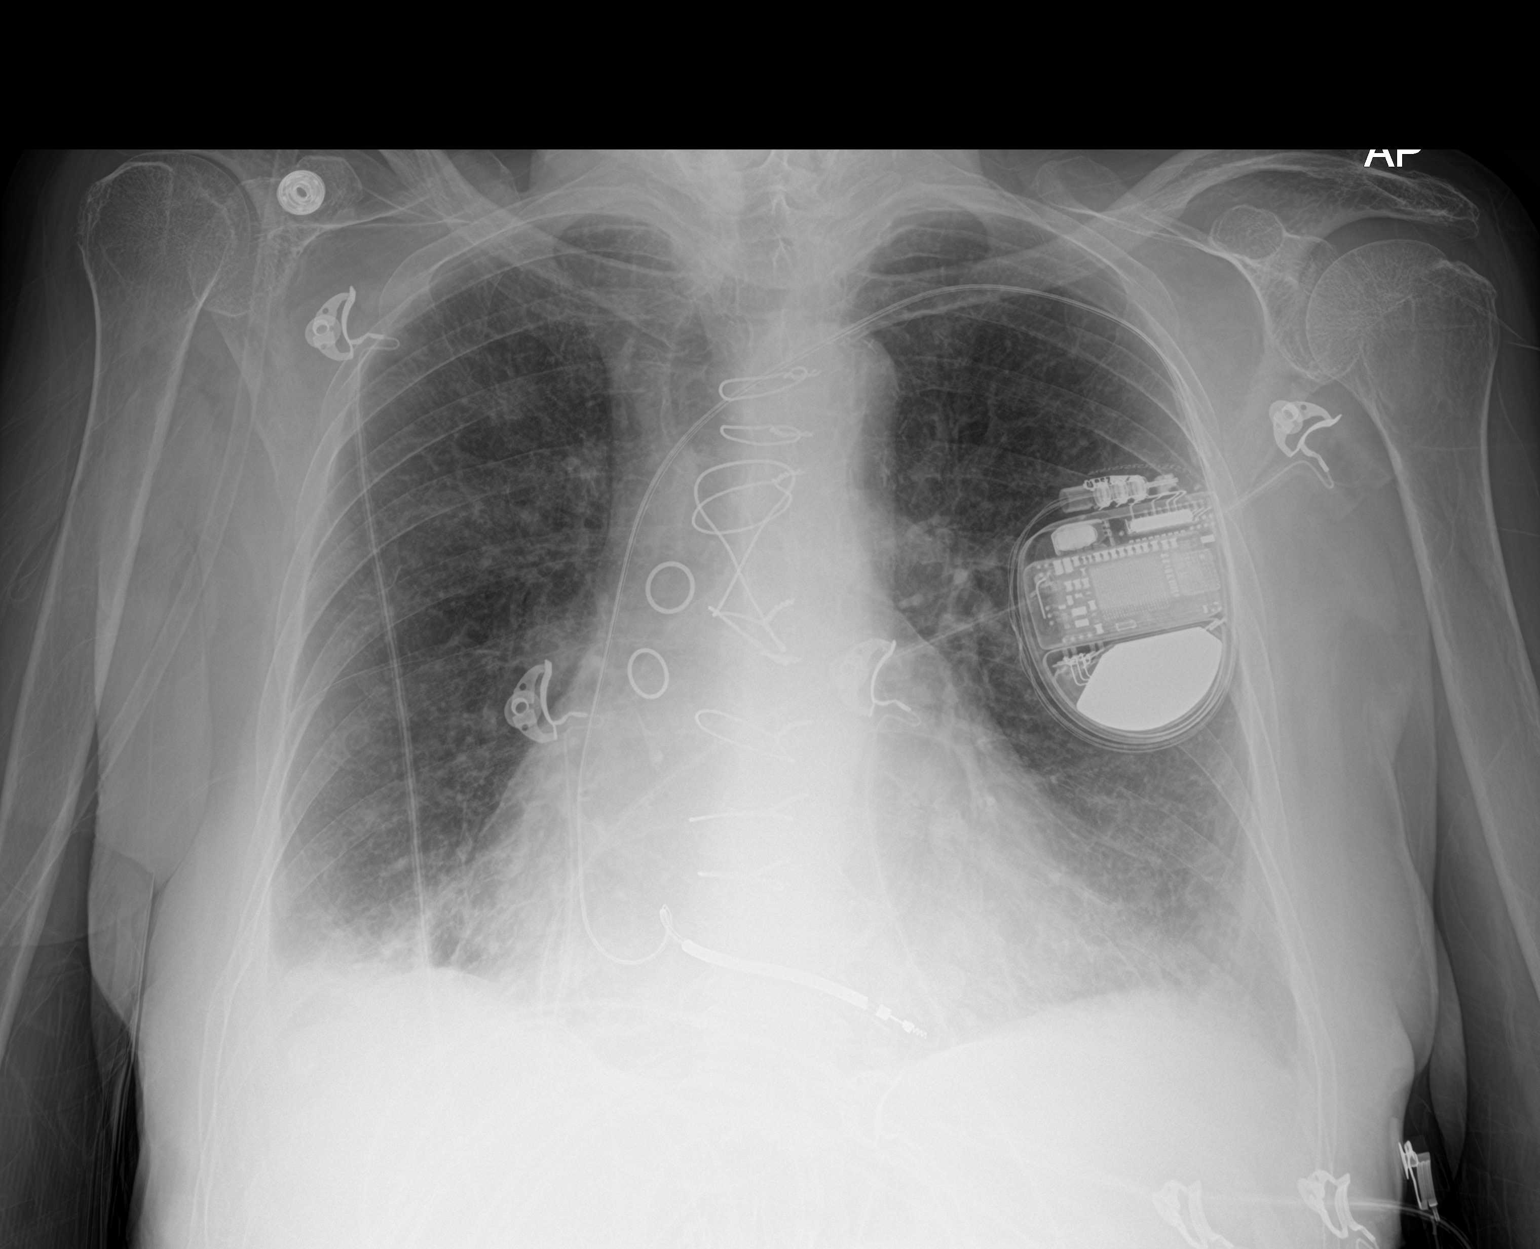

[2 of 2 positions shown; findings below may reference images not displayed]

FINDINGS: Small bilateral pleural effusions with minimal bibasilar
atelectasis. Pneumonia is not excluded. Clinical correlation is
recommended. There is no pneumothorax. There is mild cardiomegaly.
No vascular congestion or edema. Left pectoral AICD device. Median
sternotomy wires. Osteopenia with degenerative changes of the spine.
Age indeterminate, likely old, lower thoracic compression fractures
as well as old compression fracture of L1 with vertebroplasty.
Correlation with point tenderness recommended.
IMPRESSION: 1. Small bilateral pleural effusions with minimal bibasilar
atelectasis. Pneumonia is not excluded.
2. Mild cardiomegaly.  No congestion or edema.

## 2021-02-27 IMAGING — DX DG CHEST 2V
2 series · 2 of 2 positions shown · non-contrast
Comparison: Single-view of the chest 08/05/2019 and 07/31/2019.

CLINICAL DATA: Shortness of breath.

EXAM:
CHEST - 2 VIEW

[chest pa]
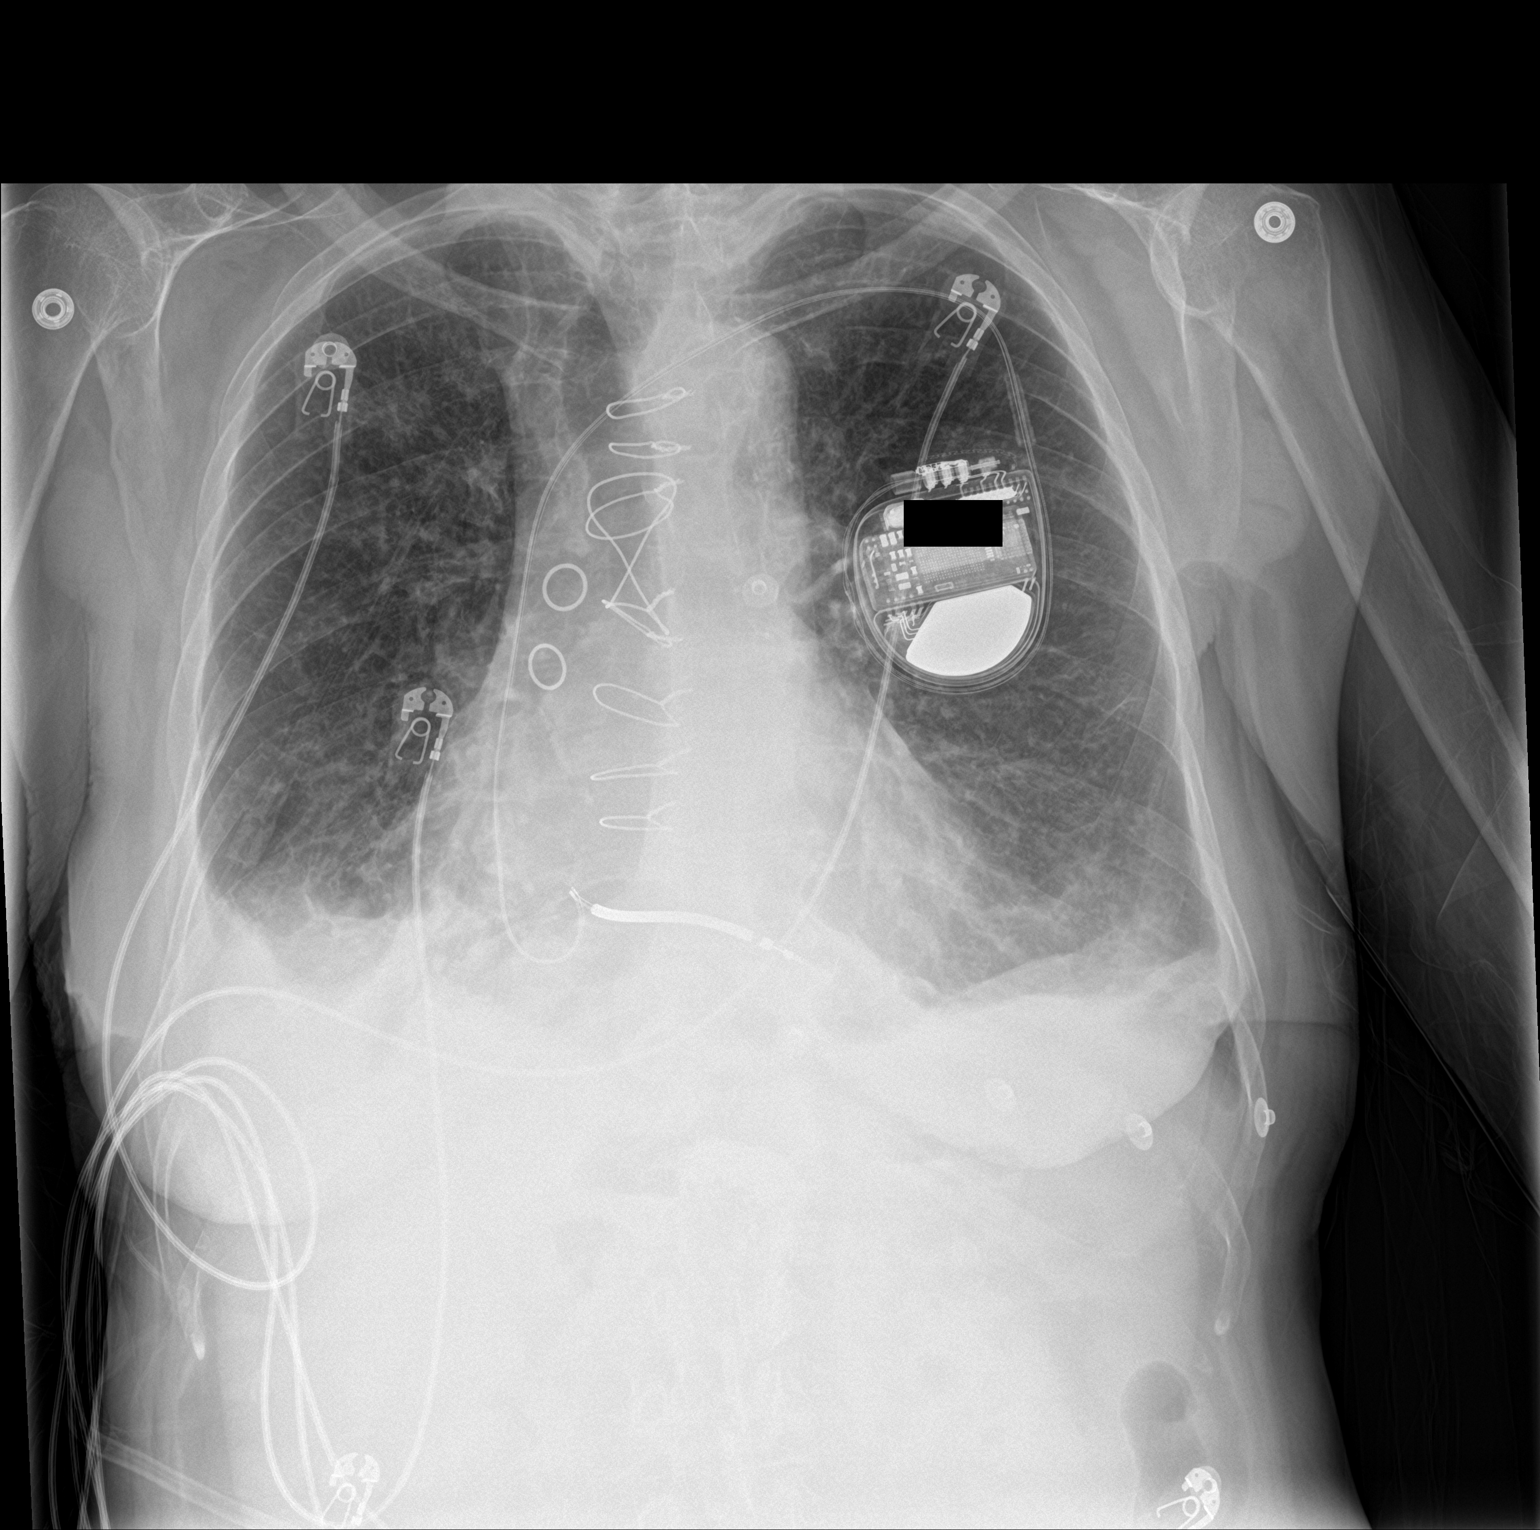

[chest lat]
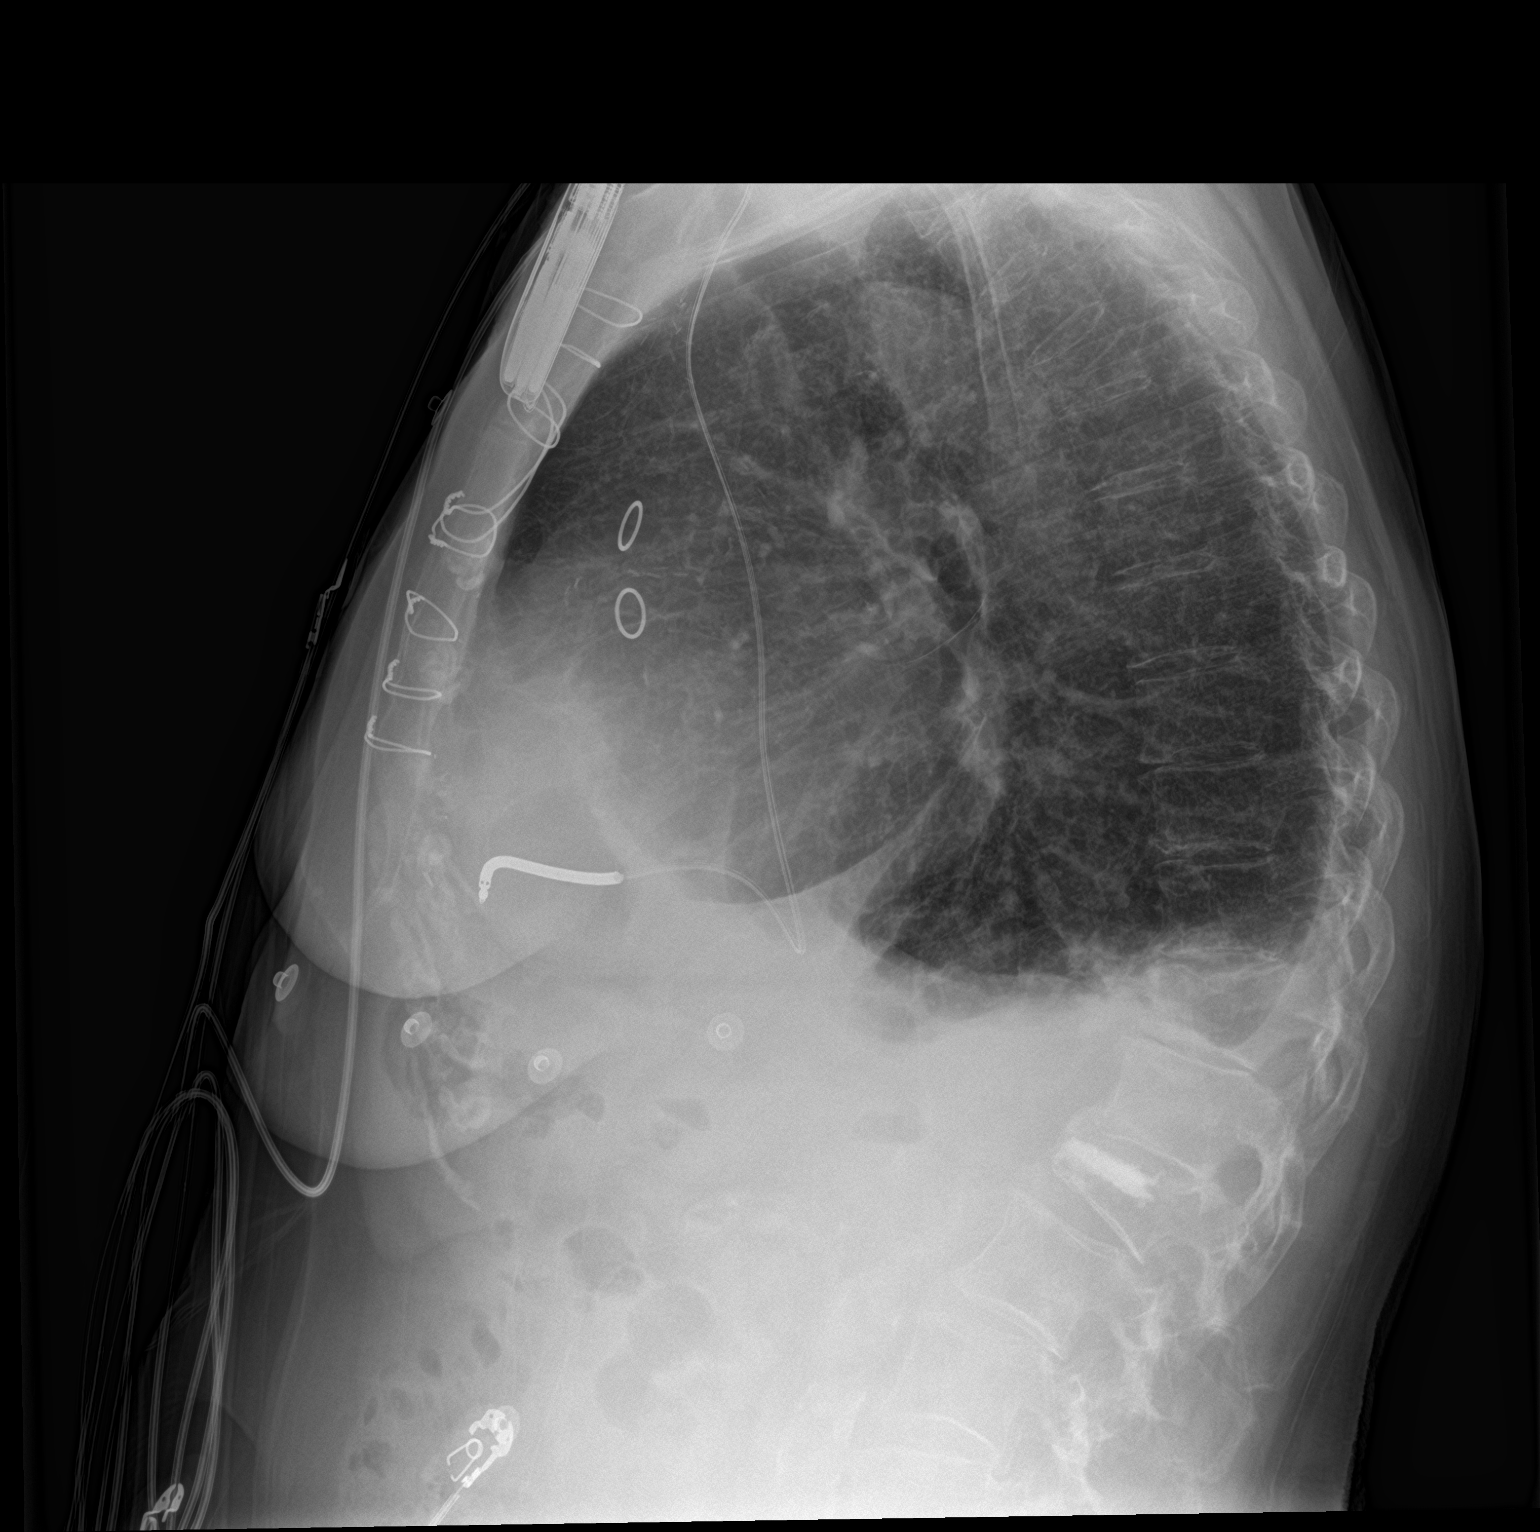

[2 of 2 positions shown; findings below may reference images not displayed]

FINDINGS: Small pleural effusions are seen, greater on the right. There is
associated basilar atelectasis. Heart size is upper normal. No
pulmonary edema or pneumothorax. The patient is status post CABG
with an AICD in place.
IMPRESSION: Small bilateral pleural effusions and basilar atelectasis are
greater on the right and not notably changed.

## 2021-03-08 IMAGING — DX DG ABDOMEN 1V
1 series · 1 of 1 positions shown · non-contrast
Comparison: July 24, 2017.

CLINICAL DATA: Nasogastric tube placement

EXAM:
ABDOMEN - 1 VIEW

[abdomen]
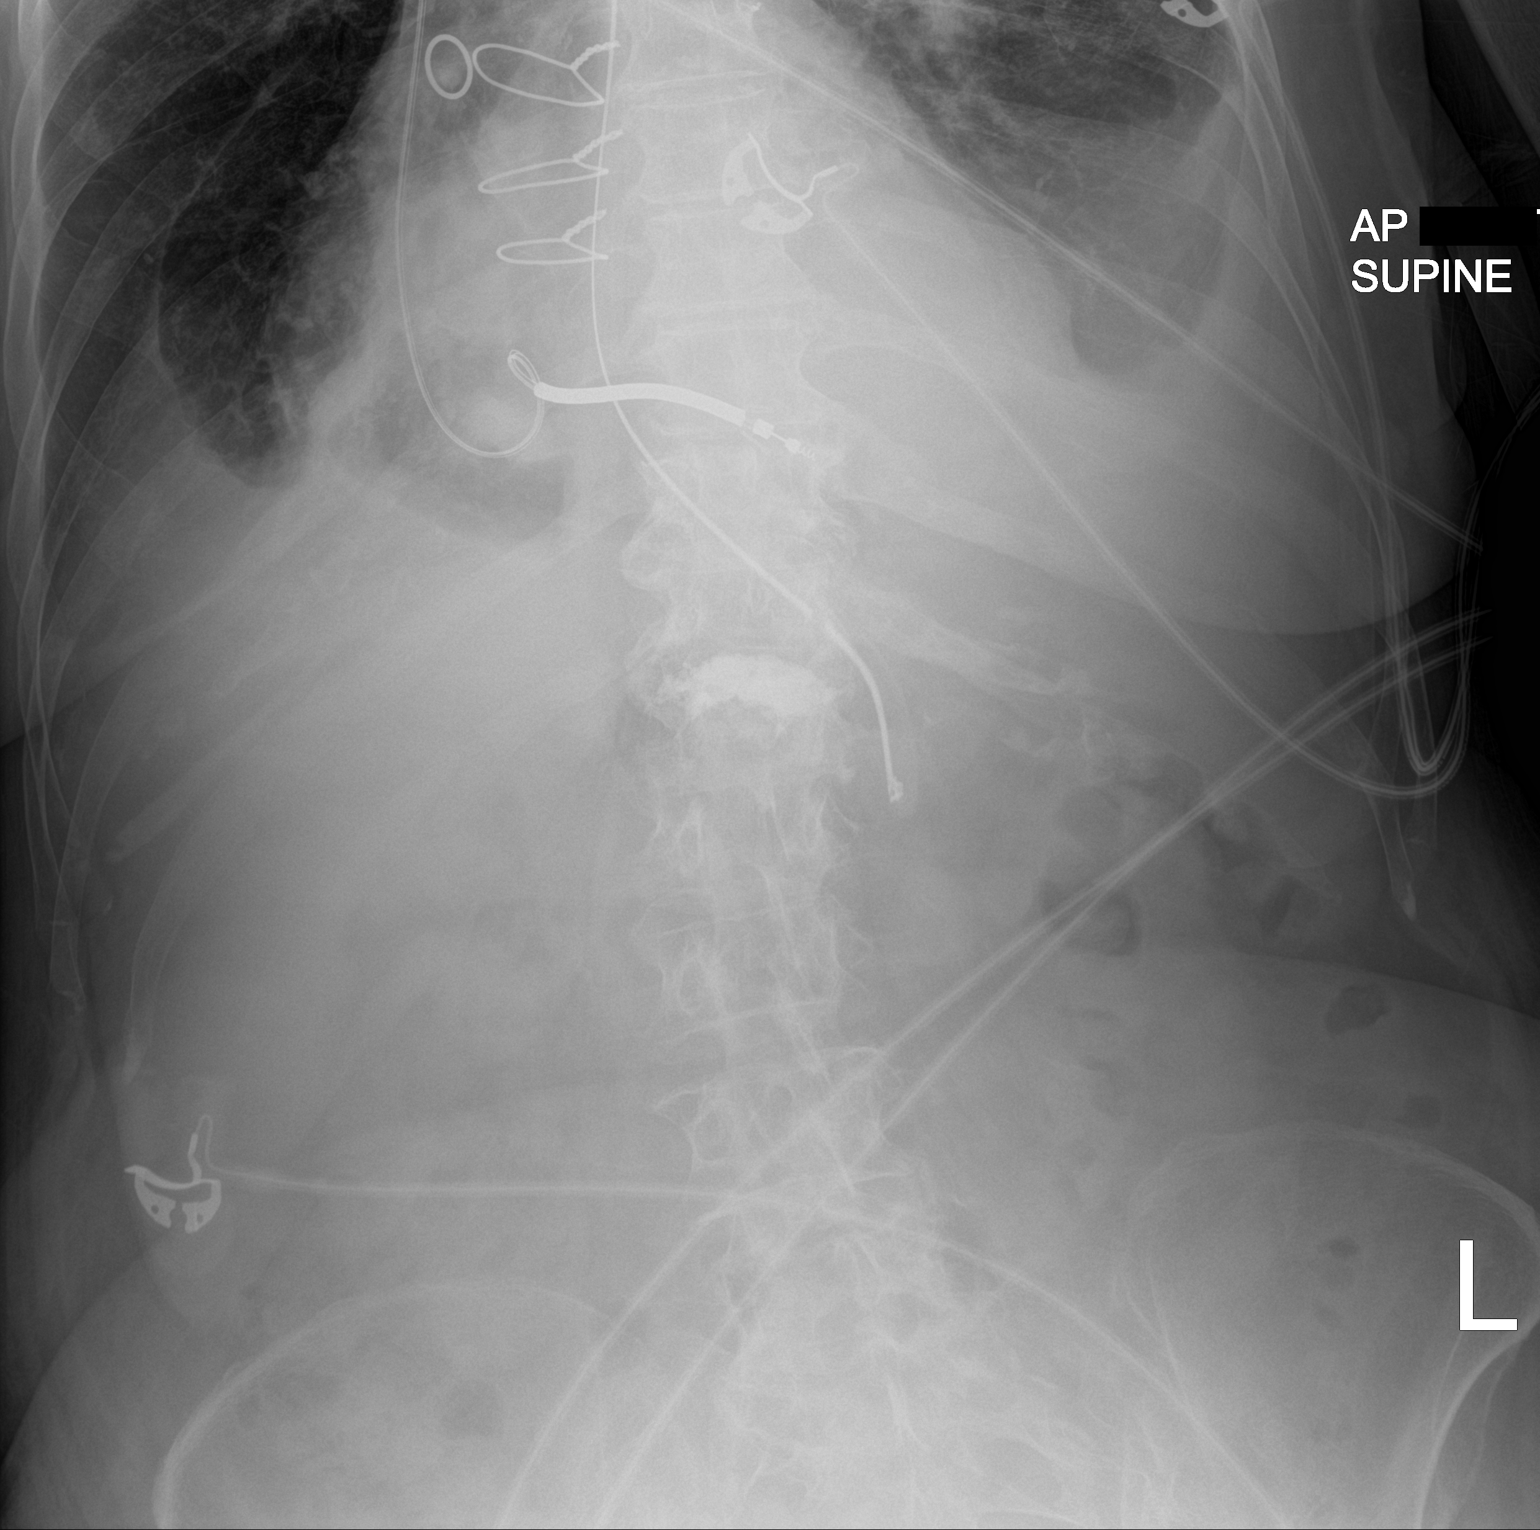

[1 of 1 positions shown; findings below may reference images not displayed]

FINDINGS: Nasogastric tube tip and side port are in the proximal body of the
stomach. There is no bowel dilatation or air-fluid level to suggest
bowel obstruction. There is moderate stool in the colon. No free air
evident. There are pleural effusions and consolidation in each lung
base.
IMPRESSION: Nasogastric tube tip and side port in proximal stomach slightly
beyond the gastroesophageal junction. No bowel obstruction or free
air appreciable. Moderate stool in colon. Pleural effusions
bilaterally with bibasilar atelectasis noted.
